# Patient Record
Sex: Male | Born: 1960 | Race: White | Hispanic: No | Marital: Single | Smoking: Never smoker
Health system: Southern US, Community
[De-identification: ages and names within clinical notes are randomized; demographics above are authoritative.]

## PROBLEM LIST (undated history)

## (undated) DIAGNOSIS — K5901 Slow transit constipation: Secondary | ICD-10-CM

## (undated) DIAGNOSIS — I10 Essential (primary) hypertension: Secondary | ICD-10-CM

## (undated) DIAGNOSIS — F329 Major depressive disorder, single episode, unspecified: Secondary | ICD-10-CM

## (undated) DIAGNOSIS — E0590 Thyrotoxicosis, unspecified without thyrotoxic crisis or storm: Secondary | ICD-10-CM

## (undated) DIAGNOSIS — F0390 Unspecified dementia without behavioral disturbance: Secondary | ICD-10-CM

## (undated) DIAGNOSIS — F32A Depression, unspecified: Secondary | ICD-10-CM

## (undated) DIAGNOSIS — G809 Cerebral palsy, unspecified: Secondary | ICD-10-CM

## (undated) DIAGNOSIS — E538 Deficiency of other specified B group vitamins: Secondary | ICD-10-CM

## (undated) HISTORY — PX: OTHER SURGICAL HISTORY: SHX169

## (undated) HISTORY — DX: Slow transit constipation: K59.01

---

## 2011-11-15 ENCOUNTER — Emergency Department: Payer: Self-pay | Admitting: Emergency Medicine

## 2014-07-21 DIAGNOSIS — Z8639 Personal history of other endocrine, nutritional and metabolic disease: Secondary | ICD-10-CM | POA: Insufficient documentation

## 2014-07-21 DIAGNOSIS — Z8679 Personal history of other diseases of the circulatory system: Secondary | ICD-10-CM | POA: Insufficient documentation

## 2014-07-21 DIAGNOSIS — G300 Alzheimer's disease with early onset: Secondary | ICD-10-CM

## 2014-07-21 DIAGNOSIS — F325 Major depressive disorder, single episode, in full remission: Secondary | ICD-10-CM | POA: Insufficient documentation

## 2014-07-21 DIAGNOSIS — F0280 Dementia in other diseases classified elsewhere without behavioral disturbance: Secondary | ICD-10-CM | POA: Insufficient documentation

## 2014-07-21 DIAGNOSIS — E538 Deficiency of other specified B group vitamins: Secondary | ICD-10-CM | POA: Insufficient documentation

## 2014-07-21 DIAGNOSIS — R32 Unspecified urinary incontinence: Secondary | ICD-10-CM | POA: Insufficient documentation

## 2014-07-21 DIAGNOSIS — G808 Other cerebral palsy: Secondary | ICD-10-CM | POA: Insufficient documentation

## 2014-07-24 DIAGNOSIS — F0280 Dementia in other diseases classified elsewhere without behavioral disturbance: Secondary | ICD-10-CM | POA: Insufficient documentation

## 2015-01-06 ENCOUNTER — Emergency Department: Payer: Medicare Other

## 2015-01-06 ENCOUNTER — Emergency Department
Admission: EM | Admit: 2015-01-06 | Discharge: 2015-01-06 | Disposition: A | Discharge status: Home / Self Care | Payer: Medicare Other | Attending: Emergency Medicine | Admitting: Emergency Medicine

## 2015-01-06 DIAGNOSIS — G809 Cerebral palsy, unspecified: Secondary | ICD-10-CM | POA: Diagnosis not present

## 2015-01-06 DIAGNOSIS — R262 Difficulty in walking, not elsewhere classified: Secondary | ICD-10-CM

## 2015-01-06 DIAGNOSIS — R531 Weakness: Secondary | ICD-10-CM

## 2015-01-06 LAB — CBC
HEMATOCRIT: 47.2 % (ref 40.0–52.0)
HEMOGLOBIN: 15.7 g/dL (ref 13.0–18.0)
MCH: 30.5 pg (ref 26.0–34.0)
MCHC: 33.3 g/dL (ref 32.0–36.0)
MCV: 91.5 fL (ref 80.0–100.0)
Platelets: 195 10*3/uL (ref 150–440)
RBC: 5.16 MIL/uL (ref 4.40–5.90)
RDW: 12.9 % (ref 11.5–14.5)
WBC: 7.5 10*3/uL (ref 3.8–10.6)

## 2015-01-06 LAB — URINALYSIS COMPLETE WITH MICROSCOPIC (ARMC ONLY)
Bilirubin Urine: NEGATIVE
GLUCOSE, UA: NEGATIVE mg/dL
HGB URINE DIPSTICK: NEGATIVE
Ketones, ur: NEGATIVE mg/dL
LEUKOCYTES UA: NEGATIVE
NITRITE: NEGATIVE
PH: 6 (ref 5.0–8.0)
PROTEIN: NEGATIVE mg/dL
SPECIFIC GRAVITY, URINE: 1.018 (ref 1.005–1.030)
SQUAMOUS EPITHELIAL / LPF: NONE SEEN

## 2015-01-06 LAB — COMPREHENSIVE METABOLIC PANEL
ALBUMIN: 3.9 g/dL (ref 3.5–5.0)
ALT: 8 U/L — AB (ref 17–63)
AST: 12 U/L — AB (ref 15–41)
Alkaline Phosphatase: 61 U/L (ref 38–126)
Anion gap: 5 (ref 5–15)
BILIRUBIN TOTAL: 0.4 mg/dL (ref 0.3–1.2)
BUN: 12 mg/dL (ref 6–20)
CHLORIDE: 109 mmol/L (ref 101–111)
CO2: 28 mmol/L (ref 22–32)
CREATININE: 0.58 mg/dL — AB (ref 0.61–1.24)
Calcium: 8.8 mg/dL — ABNORMAL LOW (ref 8.9–10.3)
GFR calc Af Amer: >60 mL/min (ref >60)
GLUCOSE: 102 mg/dL — AB (ref 65–99)
POTASSIUM: 4 mmol/L (ref 3.5–5.1)
Sodium: 142 mmol/L (ref 135–145)
TOTAL PROTEIN: 6.2 g/dL — AB (ref 6.5–8.1)

## 2015-01-06 LAB — DIFFERENTIAL
BASOS ABS: 0.1 10*3/uL (ref 0–0.1)
Basophils Relative: 1 %
EOS ABS: 0.3 10*3/uL (ref 0–0.7)
Eosinophils Relative: 4 %
LYMPHS ABS: 2.2 10*3/uL (ref 1.0–3.6)
Lymphocytes Relative: 30 %
MONOS PCT: 5 %
Monocytes Absolute: 0.4 10*3/uL (ref 0.2–1.0)
NEUTROS ABS: 4.5 10*3/uL (ref 1.4–6.5)
NEUTROS PCT: 60 %

## 2015-01-06 LAB — APTT: APTT: 30 s (ref 24–36)

## 2015-01-06 LAB — TROPONIN I

## 2015-01-06 LAB — PROTIME-INR
INR: 1.03
Prothrombin Time: 13.7 seconds (ref 11.4–15.0)

## 2015-01-06 LAB — GLUCOSE, CAPILLARY: Glucose-Capillary: 86 mg/dL (ref 65–99)

## 2015-01-06 NOTE — ED Notes (Signed)
Family at bedside. 

## 2015-01-06 NOTE — ED Notes (Signed)
Per pt caregiver, pt was normal at baseline when dropped off at the day center around 815 am, states when they picked the pt up around 3:15-3:20pm the pt was not able to walk and had drooling.. Pt has a hx of dementia and does not normally speak much..Marland Kitchen

## 2015-01-06 NOTE — ED Notes (Signed)
Pt back from CT

## 2015-01-06 NOTE — ED Notes (Signed)
Patient denies pain and is resting comfortably.  

## 2015-01-06 NOTE — Discharge Instructions (Signed)
Mr. obese tests, including head CT and urinalysis and blood tests, overall looks quite good. There are no acute changes. His movement of all 4 extremities seemed reasonable with his baseline. He followed commands and answered yes no appropriately. He reported that this was his usual manner communication.  Please follow-up with his regular doctor for any further concerns. Return to emergency department if there are further urgent concerns.   Cerebral Palsy Cerebral palsy is a disorder that affects a person's ability to control movement and posture. This condition results from damage to the areas of the brain that control the use of muscles. It appears in the first few years of life. The condition may affect the whole body or just certain parts. Cerebral palsy cannot be cured. However, the condition will not progress or get worse, and symptoms can be improved with treatment. The effects of cerebral palsy can range from mild to severe. Many people with cerebral palsy can live near-normal lives if their problems are properly managed. In other cases, the condition can cause severe disabilities. There are three main types of cerebral palsy:  Spastic. This is the most common type. The muscles are in a constant state of tightness. This leads to stiff or jerky movements. Arms, legs, and other areas of the body may be affected to varying degrees.  Athetoid or dyskinetic. The muscle tone varies, sometimes being too tight and sometimes too loose. This leads to difficulty controlling and coordinating movement. Involuntary movements and constant motion are common.  Ataxic. This type leads to problems with balance and coordination. The person may have a staggering walk, unsteady hands, and abnormal eye movements. Some people may have a combination of these types. CAUSES Cerebral palsy is caused by injury to--or abnormal development of--the parts of the brain that control the use of muscles. In most cases, the damage  to the brain occurs before birth (congenital). It can also occur during or after birth. Some common causes of this damage include:  Head injury.  Meningitis or encephalitis.  Infections that were passed from the mother.  Genetic disorders.  Stroke before birth.  Lack of oxygen to the brain during birth. This is rare.  Severe jaundice after birth. RISK FACTORS Some things that can increase a child's risk for cerebral palsy include:  Premature birth.  Low birth weight.  Infections in the mother during pregnancy. SIGNS AND SYMPTOMS Signs and symptoms vary from person to person and can change over time. They can range from mild to severe. Early signs of cerebral palsy usually appear before 60 years of age. Children with cerebral palsy are often slow to reach developmental milestones, such as:  Rolling over.  Sitting.  Crawling.  Walking.  Talking. Later symptoms may include:  Stiff or jerky movements of the legs, arms, and back.  Abnormal movements that can be stiff or floppy.  Slow movements.  Trouble with muscle coordination and balance.  Difficulty walking.  Difficulty sitting straight.  Difficulty with fine motor tasks, such as writing or using scissors.  Exaggerated or reduced reflexes.  Poor control of the mouth, which an lead to drooling, problems with chewing and swallowing, or problems with speech. Some people with cerebral palsy are also affected by other medical problems, such as:  Seizures.  Mental impairment.  Problems with vision, hearing, or speech.  Dental problems and oral diseases. DIAGNOSIS To make a diagnosis, your child's health care provider may:  Do a physical exam and take your child's medical history.  Test  your child's muscle tone, motor skills, and reflexes.  Order imaging tests of your child's brain, such as an ultrasound, CT scan, or MRI.  Order blood tests to check for other conditions. TREATMENT There is no cure for  cerebral palsy. Treatment will be aimed at helping to control your child's symptoms and helping to improve his or her ability to function. Your child may be referred to an early intervention program to help coordinate treatment. Treatment may include:  Medicines to control seizures and muscle spasms.  Braces or splints to help with muscle imbalance.  Surgery to treat muscle stiffness or to relax muscle tendons that are too tight.  Mechanical aids to help perform tasks that are difficult because of impairments.  Physical, occupational, and speech therapy.  Counseling for emotional and psychological needs. HOME CARE INSTRUCTIONS  Give medicines only as directed by your child's health care provider.   Work closely with your child's team of health care providers. This team may include:  A physical therapist to help your child build stronger muscles and improve balance and coordination.  A speech and language therapist to help your child with speech, language, and feeding.  An occupational therapist to help your child with skills that are needed for tasks such as eating, dressing, and writing.  A counselor or a behavioral therapist to help your child with emotional or behavioral issues.  A neurologist or a developmental pediatrician to help coordinate care, treat medical conditions, and manage medicines.  Keep all follow-up visits as directed by your child's health care provider. This is important. SEEK MEDICAL CARE IF:  Your child has a fever.  Your child has chills.  Your child's symptoms are getting worse.  Your child develops new symptoms.  Your child has a cough that will not go away.  Your child is having trouble getting enough nutrition.  Your child has shortness of breath.  Your child is anxious or depressed. SEEK IMMEDIATE MEDICAL CARE IF:  Your child chokes, coughs, or has trouble breathing after eating or drinking.  Your child gets hurt in a fall.  Your  child has new seizures or a significant change in his or her usual seizure pattern.   This information is not intended to replace advice given to you by your health care provider. Make sure you discuss any questions you have with your health care provider.   Document Released: 10/09/2001 Document Revised: 02/07/2014 Document Reviewed: 08/28/2013 Elsevier Interactive Patient Education [employer]2016 [employer] Inc.

## 2015-01-06 NOTE — ED Provider Notes (Addendum)
[facility] Emergency Department Provider Note  ____________________________________________  Time seen: [id number]   I have reviewed the triage vital signs and the nursing notes.  History by:  2 caretakers from [facility]  HISTORY  Chief Complaint Weakness     HPI Maurice Clayton is a 54 y.o. male with cerebral palsy. He requires a fair amount of support and care. He has a diagnosis of dementia as well. He goes to a day care program. Today when staff from [facility] take the patient not, they report that he had decreased ability to ambulate. Usually he can ambulate with staff assisting him. Sometimes she uses a walker. Today, he had decreased strength in his legs. He also had a degree of drooling.  Now, in the emergency department, the patient is alert and acting as he usually does according to the care home staff. There are tooth care home staff members here. They report no acute changes at this present moment except for the decreased ability to ambulate.  The patient is alert. He has limited communication baseline, but is communicating to his usual ability, according to the care home staff. He denies any pain, difficulty breathing. He follows commands and is able to move all 4 extremities and has usual manner.    Past medical history: Cerebral palsy Dementia Urinary incontinence UTIs  There are no active problems to display for this patient.   No past surgical history on file.  No current outpatient prescriptions on file.  Allergies Review of patient's allergies indicates not on file.  No family history on file.  Social History Social History  Substance Use Topics  . Smoking status: Never Smoker   . Smokeless tobacco: None  . Alcohol Use: No    Review of Systems Review of systems a possible due to the patient's cerebral palsy, limited cognition communication.    ____________________________________________   PHYSICAL  EXAM:  VITAL SIGNS: ED Triage Vitals  Enc Vitals Group     BP 01/06/15 1830 107/72 mmHg     Pulse Rate 01/06/15 1830 70     Resp 01/06/15 1830 16     Temp 01/06/15 1830 98 F (36.7 C)     Temp Source 01/06/15 1830 Oral     SpO2 01/06/15 1830 96 %     Weight 01/06/15 1830 113 lb (51.256 kg)     Height 01/06/15 1830 5\' 7"  (1.702 m)     Head Cir --      Peak Flow --      Pain Score --      Pain Loc --      Pain Edu? --      Excl. in GC? --     Constitutional:  Alert. Noted assistance needed for transfer from wheelchair to bed. Patient has limited communication, but is acting in his usual manner according to care home staff. Patient does respond to my questions and follows commands. ENT   Head:  atraumatic.   Nose: No congestion/rhinnorhea.       Mouth: No erythema, no swelling  patient does have upper and lower dentures. The uppers are a little bit loose and he seems to be playing with them and moving them around his mouth a little bit. He appears to have good control.  Cardiovascular: Normal rate, regular rhythm, no murmur noted Respiratory:  Normal respiratory effort, no tachypnea.    Breath sounds are clear and equal bilaterally.  Gastrointestinal: Soft, no distention. Nontender Back: No  muscle spasm, no tenderness, no CVA tenderness. Musculoskeletal: No deformity noted. Nontender with normal range of motion in all extremities.  No noted edema. Neurologic:  Patient does not communicate verbally. He is able to nod yes and no appropriately to my questions. He is following commands and moving all 4 extremities. He has 4-5 over 5 strength in all 4 extremities. Skin:  Skin is warm, dry. No rash noted. Psychiatric: Limited communication. Cooperative, calm. Acting as he usually does according to the care home staff.  ____________________________________________    LABS (pertinent positives/negatives)  Labs Reviewed  COMPREHENSIVE METABOLIC PANEL - Abnormal; Notable for the  following:    Glucose, Bld 102 (*)    Creatinine, Ser 0.58 (*)    Calcium 8.8 (*)    Total Protein 6.2 (*)    AST 12 (*)    ALT 8 (*)    All other components within normal limits  URINALYSIS COMPLETEWITH MICROSCOPIC ([facility] ONLY) - Abnormal; Notable for the following:    Color, Urine YELLOW (*)    APPearance CLEAR (*)    Bacteria, UA RARE (*)    All other components within normal limits  PROTIME-INR  APTT  CBC  DIFFERENTIAL  TROPONIN I  GLUCOSE, CAPILLARY     ____________________________________________   EKG  ED ECG REPORT I, Catrinia Racicot W, the attending physician, personally viewed and interpreted this ECG.   Date: 01/06/2015  EKG Time: 1908  Rate: 64  Rhythm:  Normal sinus rhythm  Axis: Normal  Intervals: Normal  ST&T Change: None noted   ____________________________________________    RADIOLOGY  CT head IMPRESSION: 1. No acute intracranial pathology seen on CT. 2. Mild cortical volume loss noted. 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  These results were called by telephone at the time of interpretation on 01/06/2015 at 6:58 pm to Dr. Sharma Covert, who verbally acknowledged these results.  ____________________________________________   INITIAL IMPRESSION / ASSESSMENT AND PLAN / ED COURSE  Pertinent labs & imaging results that were available during my care of the patient were reviewed by me and considered in my medical decision making (see chart for details).  Slightly limited exam due to the patient's sliding cognition and communication, but this 54 year old male appears stable and well moment, acting and communicating as he usually does.  He requires assistance with ambulation baseline, however, he is requiring additional assistance this evening. While he was reported to be drooling earlier, he is not drooling at this time and appears to be functioning at his baseline.  CT head does not show any acute changes.  He does have a history of urinary tract  infections. We will check a urine along with the stroke protocol orders that were ordered by nursing.  ----------------------------------------- 10:05 PM on 01/06/2015 -----------------------------------------  Urine test is negative. Patient looks stable and care home staff report he is at his baseline. We will discharge him back to [facility] for his ongoing care.  ____________________________________________   FINAL CLINICAL IMPRESSION(S) / ED DIAGNOSES  Final diagnoses:  General weakness  Ambulatory dysfunction  Cerebral palsy, unspecified type (HCC)      Darien Ramus, MD 01/06/15 2205  Darien Ramus, MD 01/06/15 2206

## 2015-01-06 NOTE — ED Notes (Signed)
Pt taken to CT.

## 2015-01-13 ENCOUNTER — Emergency Department
Admission: EM | Admit: 2015-01-13 | Discharge: 2015-01-13 | Disposition: A | Discharge status: Home / Self Care | Payer: Medicare Other | Attending: Emergency Medicine | Admitting: Emergency Medicine

## 2015-01-13 ENCOUNTER — Encounter: Payer: Self-pay | Admitting: Emergency Medicine

## 2015-01-13 ENCOUNTER — Emergency Department: Payer: Medicare Other

## 2015-01-13 DIAGNOSIS — Y9389 Activity, other specified: Secondary | ICD-10-CM | POA: Insufficient documentation

## 2015-01-13 DIAGNOSIS — Y998 Other external cause status: Secondary | ICD-10-CM | POA: Diagnosis not present

## 2015-01-13 DIAGNOSIS — Y92129 Unspecified place in nursing home as the place of occurrence of the external cause: Secondary | ICD-10-CM | POA: Diagnosis not present

## 2015-01-13 DIAGNOSIS — S92501A Displaced unspecified fracture of right lesser toe(s), initial encounter for closed fracture: Secondary | ICD-10-CM

## 2015-01-13 DIAGNOSIS — S92531A Displaced fracture of distal phalanx of right lesser toe(s), initial encounter for closed fracture: Secondary | ICD-10-CM | POA: Diagnosis not present

## 2015-01-13 DIAGNOSIS — S90121A Contusion of right lesser toe(s) without damage to nail, initial encounter: Secondary | ICD-10-CM | POA: Insufficient documentation

## 2015-01-13 DIAGNOSIS — S99921A Unspecified injury of right foot, initial encounter: Secondary | ICD-10-CM | POA: Diagnosis present

## 2015-01-13 DIAGNOSIS — W228XXA Striking against or struck by other objects, initial encounter: Secondary | ICD-10-CM | POA: Insufficient documentation

## 2015-01-13 DIAGNOSIS — G809 Cerebral palsy, unspecified: Secondary | ICD-10-CM | POA: Insufficient documentation

## 2015-01-13 DIAGNOSIS — F0390 Unspecified dementia without behavioral disturbance: Secondary | ICD-10-CM | POA: Insufficient documentation

## 2015-01-13 HISTORY — DX: Cerebral palsy, unspecified: G80.9

## 2015-01-13 NOTE — ED Notes (Signed)
Pt stubbed fifth toe on right foot, bruising and bleeding per caregiver.

## 2015-01-13 NOTE — Discharge Instructions (Signed)
Buddy tape toe for 2-3 weeks pending orthopedic reevaluation.

## 2015-01-13 NOTE — ED Notes (Signed)
Right 5 th toe bruised and swollen   Per staff he hit his toe this am   No swelling noted to foot

## 2015-01-13 NOTE — ED Provider Notes (Signed)
[facility] Emergency Department Provider Note  ____________________________________________  Time seen: Approximately 10:59 AM  I have reviewed the triage vital signs and the nursing notes.   HISTORY  Chief Complaint Toe Pain  History given by care provider  HPI QUINLIN CONANT is a 54 y.o. male cerebral palsy patient from the [facility] nursing home. Patient has edema and ecchymosis to the fifth digit right toe staff states today he stubbed his toe. Patient also has a diagnosis of dementia which is limiting history of present illness. Per nursing home staff he is communicating his baseline abilities. Patient is alert and follow commands when asked to move his right foot. Patient cannot rate on describe his pain.  Past Medical History  Diagnosis Date  . Cerebral palsy (HCC)     There are no active problems to display for this patient.   History reviewed. No pertinent past surgical history.  No current outpatient prescriptions on file.  Allergies Review of patient's allergies indicates no known allergies.  No family history on file.  Social History Social History  Substance Use Topics  . Smoking status: Never Smoker   . Smokeless tobacco: None  . Alcohol Use: No    Review of Systems Constitutional: No fever/chills Eyes: No visual changes. ENT: No sore throat. Cardiovascular: Denies chest pain. Respiratory: Denies shortness of breath. Gastrointestinal: No abdominal pain.  No nausea, no vomiting.  No diarrhea.  No constipation. Genitourinary: Negative for dysuria. Musculoskeletal: Negative for back pain. Skin: Negative for rash. Neurological: Cerebral palsy Psychiatric:Dementia 10-point ROS otherwise negative.  ____________________________________________   PHYSICAL EXAM:  VITAL SIGNS: ED Triage Vitals  Enc Vitals Group     BP 01/13/15 1020 148/80 mmHg     Pulse Rate 01/13/15 1020 105     Resp 01/13/15 1020 18     Temp 01/13/15  1020 97.6 F (36.4 C)     Temp Source 01/13/15 1020 Oral     SpO2 01/13/15 1020 99 %     Weight 01/13/15 1020 107 lb (48.535 kg)     Height 01/13/15 1020  (1.651 m)     Head Cir --      Peak Flow --      Pain Score --      Pain Loc --      Pain Edu? --      Excl. in GC? --     Constitutional: Alert and oriented. Well appearing and in no acute distress. Eyes: Conjunctivae are normal. PERRL. EOMI. Head: Atraumatic. Nose: No congestion/rhinnorhea. Mouth/Throat: Mucous membranes are moist.  Oropharynx non-erythematous. Neck: No stridor.  No cervical spine tenderness to palpation. Hematological/Lymphatic/Immunilogical: No cervical lymphadenopathy. Cardiovascular: Normal rate, regular rhythm. Grossly normal heart sounds.  Good peripheral circulation. Respiratory: Normal respiratory effort.  No retractions. Lungs CTAB. Gastrointestinal: Soft and nontender. No distention. No abdominal bruits. No CVA tenderness. Musculoskeletal: Guarding palpation of the fifth digit right foot. Neurologic:  Normal speech and language. No gross focal neurologic deficits are appreciated. No gait instability. Skin:  Skin is warm, dry and intact. No rash noted. Psychiatric: Mood and affect are normal. Speech and behavior are normal.  ____________________________________________   LABS (all labs ordered are listed, but only abnormal results are displayed)  Labs Reviewed - No data to display ____________________________________________  EKG   ____________________________________________  RADIOLOGY acute mild displaced fracture involving the distal phalanx fifth digit right foot. ____________________________________________   PROCEDURES  Procedure(s) performed: None  Critical Care performed: No  ____________________________________________  INITIAL IMPRESSION / ASSESSMENT AND PLAN / ED COURSE  Pertinent labs & imaging results that were available during my care of the patient were  reviewed by me and considered in my medical decision making (see chart for details).  Fractures fifth digit right foot. Discuss x-ray results with nursing home staff. Toe was cleaning buddy tape patient care provider given instructional care. Advised to follow orthopedics in one week. ____________________________________________   FINAL CLINICAL IMPRESSION(S) / ED DIAGNOSES  Final diagnoses:  Fracture of fifth toe, right, closed, initial encounter      Joni ReiningRonald K Antwione Picotte, PA-C 01/13/15 1214  Minna AntisKevin Paduchowski, MD 01/13/15 1558

## 2015-02-06 ENCOUNTER — Emergency Department: Payer: Medicare Other

## 2015-02-06 ENCOUNTER — Inpatient Hospital Stay
Admission: EM | Admit: 2015-02-06 | Discharge: 2015-02-13 | DRG: 469 | Disposition: A | Discharge status: Skilled Nursing Facility | Payer: Medicare Other | Attending: Internal Medicine | Admitting: Internal Medicine

## 2015-02-06 ENCOUNTER — Encounter: Payer: Self-pay | Admitting: Emergency Medicine

## 2015-02-06 DIAGNOSIS — Z905 Acquired absence of kidney: Secondary | ICD-10-CM

## 2015-02-06 DIAGNOSIS — R509 Fever, unspecified: Secondary | ICD-10-CM

## 2015-02-06 DIAGNOSIS — E0590 Thyrotoxicosis, unspecified without thyrotoxic crisis or storm: Secondary | ICD-10-CM | POA: Diagnosis present

## 2015-02-06 DIAGNOSIS — G808 Other cerebral palsy: Secondary | ICD-10-CM | POA: Diagnosis present

## 2015-02-06 DIAGNOSIS — Z96649 Presence of unspecified artificial hip joint: Secondary | ICD-10-CM

## 2015-02-06 DIAGNOSIS — N2889 Other specified disorders of kidney and ureter: Secondary | ICD-10-CM

## 2015-02-06 DIAGNOSIS — R109 Unspecified abdominal pain: Secondary | ICD-10-CM

## 2015-02-06 DIAGNOSIS — K5641 Fecal impaction: Secondary | ICD-10-CM

## 2015-02-06 DIAGNOSIS — Z419 Encounter for procedure for purposes other than remedying health state, unspecified: Secondary | ICD-10-CM

## 2015-02-06 DIAGNOSIS — G300 Alzheimer's disease with early onset: Secondary | ICD-10-CM | POA: Diagnosis present

## 2015-02-06 DIAGNOSIS — E538 Deficiency of other specified B group vitamins: Secondary | ICD-10-CM | POA: Diagnosis present

## 2015-02-06 DIAGNOSIS — S72001A Fracture of unspecified part of neck of right femur, initial encounter for closed fracture: Secondary | ICD-10-CM | POA: Diagnosis present

## 2015-02-06 DIAGNOSIS — W19XXXA Unspecified fall, initial encounter: Secondary | ICD-10-CM | POA: Diagnosis present

## 2015-02-06 DIAGNOSIS — Z7401 Bed confinement status: Secondary | ICD-10-CM

## 2015-02-06 DIAGNOSIS — S7291XA Unspecified fracture of right femur, initial encounter for closed fracture: Secondary | ICD-10-CM | POA: Diagnosis present

## 2015-02-06 DIAGNOSIS — F329 Major depressive disorder, single episode, unspecified: Secondary | ICD-10-CM | POA: Diagnosis present

## 2015-02-06 DIAGNOSIS — Z7982 Long term (current) use of aspirin: Secondary | ICD-10-CM

## 2015-02-06 DIAGNOSIS — I10 Essential (primary) hypertension: Secondary | ICD-10-CM | POA: Diagnosis present

## 2015-02-06 DIAGNOSIS — F0280 Dementia in other diseases classified elsewhere without behavioral disturbance: Secondary | ICD-10-CM | POA: Diagnosis present

## 2015-02-06 DIAGNOSIS — M1990 Unspecified osteoarthritis, unspecified site: Secondary | ICD-10-CM | POA: Diagnosis present

## 2015-02-06 DIAGNOSIS — R1084 Generalized abdominal pain: Secondary | ICD-10-CM | POA: Diagnosis not present

## 2015-02-06 DIAGNOSIS — K5649 Other impaction of intestine: Secondary | ICD-10-CM | POA: Diagnosis present

## 2015-02-06 DIAGNOSIS — J189 Pneumonia, unspecified organism: Secondary | ICD-10-CM | POA: Diagnosis not present

## 2015-02-06 DIAGNOSIS — Y838 Other surgical procedures as the cause of abnormal reaction of the patient, or of later complication, without mention of misadventure at the time of the procedure: Secondary | ICD-10-CM | POA: Diagnosis not present

## 2015-02-06 DIAGNOSIS — K5900 Constipation, unspecified: Secondary | ICD-10-CM

## 2015-02-06 DIAGNOSIS — Z79899 Other long term (current) drug therapy: Secondary | ICD-10-CM | POA: Diagnosis not present

## 2015-02-06 DIAGNOSIS — Z681 Body mass index (BMI) 19 or less, adult: Secondary | ICD-10-CM

## 2015-02-06 DIAGNOSIS — J9589 Other postprocedural complications and disorders of respiratory system, not elsewhere classified: Secondary | ICD-10-CM | POA: Diagnosis not present

## 2015-02-06 DIAGNOSIS — L8990 Pressure ulcer of unspecified site, unspecified stage: Secondary | ICD-10-CM | POA: Insufficient documentation

## 2015-02-06 DIAGNOSIS — R52 Pain, unspecified: Secondary | ICD-10-CM

## 2015-02-06 HISTORY — DX: Deficiency of other specified B group vitamins: E53.8

## 2015-02-06 HISTORY — DX: Thyrotoxicosis, unspecified without thyrotoxic crisis or storm: E05.90

## 2015-02-06 HISTORY — DX: Essential (primary) hypertension: I10

## 2015-02-06 HISTORY — DX: Depression, unspecified: F32.A

## 2015-02-06 HISTORY — DX: Major depressive disorder, single episode, unspecified: F32.9

## 2015-02-06 HISTORY — DX: Unspecified dementia, unspecified severity, without behavioral disturbance, psychotic disturbance, mood disturbance, and anxiety: F03.90

## 2015-02-06 LAB — URINALYSIS COMPLETE WITH MICROSCOPIC (ARMC ONLY)
Bacteria, UA: NONE SEEN
Bilirubin Urine: NEGATIVE
Glucose, UA: NEGATIVE mg/dL
Hgb urine dipstick: NEGATIVE
KETONES UR: NEGATIVE mg/dL
Leukocytes, UA: NEGATIVE
NITRITE: NEGATIVE
PH: 6 (ref 5.0–8.0)
PROTEIN: NEGATIVE mg/dL
SPECIFIC GRAVITY, URINE: 1.011 (ref 1.005–1.030)
Squamous Epithelial / LPF: NONE SEEN

## 2015-02-06 LAB — COMPREHENSIVE METABOLIC PANEL
ALBUMIN: 3.5 g/dL (ref 3.5–5.0)
ALK PHOS: 101 U/L (ref 38–126)
ALT: 12 U/L — ABNORMAL LOW (ref 17–63)
ANION GAP: 7 (ref 5–15)
AST: 11 U/L — ABNORMAL LOW (ref 15–41)
BILIRUBIN TOTAL: 0.4 mg/dL (ref 0.3–1.2)
BUN: 9 mg/dL (ref 6–20)
CALCIUM: 8.8 mg/dL — AB (ref 8.9–10.3)
CO2: 29 mmol/L (ref 22–32)
CREATININE: 0.55 mg/dL — AB (ref 0.61–1.24)
Chloride: 103 mmol/L (ref 101–111)
GFR calc Af Amer: >60 mL/min (ref >60)
GFR calc non Af Amer: >60 mL/min (ref >60)
GLUCOSE: 96 mg/dL (ref 65–99)
Potassium: 3.9 mmol/L (ref 3.5–5.1)
Sodium: 139 mmol/L (ref 135–145)
TOTAL PROTEIN: 6.2 g/dL — AB (ref 6.5–8.1)

## 2015-02-06 LAB — CBC WITH DIFFERENTIAL/PLATELET
BASOS PCT: 1 %
Basophils Absolute: 0 10*3/uL (ref 0–0.1)
Eosinophils Absolute: 0.1 10*3/uL (ref 0–0.7)
Eosinophils Relative: 2 %
HEMATOCRIT: 46.1 % (ref 40.0–52.0)
HEMOGLOBIN: 15.2 g/dL (ref 13.0–18.0)
LYMPHS ABS: 1.4 10*3/uL (ref 1.0–3.6)
Lymphocytes Relative: 23 %
MCH: 30 pg (ref 26.0–34.0)
MCHC: 33.1 g/dL (ref 32.0–36.0)
MCV: 90.7 fL (ref 80.0–100.0)
MONOS PCT: 7 %
Monocytes Absolute: 0.4 10*3/uL (ref 0.2–1.0)
NEUTROS ABS: 4.3 10*3/uL (ref 1.4–6.5)
NEUTROS PCT: 67 %
Platelets: 281 10*3/uL (ref 150–440)
RBC: 5.08 MIL/uL (ref 4.40–5.90)
RDW: 13.1 % (ref 11.5–14.5)
WBC: 6.3 10*3/uL (ref 3.8–10.6)

## 2015-02-06 LAB — TROPONIN I: Troponin I: <0.03 ng/mL (ref <0.031)

## 2015-02-06 LAB — SEDIMENTATION RATE: Sed Rate: 2 mm/hr (ref 0–20)

## 2015-02-06 MED ORDER — ACETAMINOPHEN 325 MG PO TABS
650.0000 mg | ORAL_TABLET | Freq: Four times a day (QID) | ORAL | Status: DC | PRN
  Administered 2015-02-07 – 2015-02-11 (×2): 650 mg via ORAL
  Filled 2015-02-06 (×2): qty 2

## 2015-02-06 MED ORDER — ONDANSETRON HCL 4 MG PO TABS
4.0000 mg | ORAL_TABLET | Freq: Four times a day (QID) | ORAL | Status: DC | PRN
Start: 2015-02-06 — End: 2015-02-13

## 2015-02-06 MED ORDER — FLEET ENEMA 7-19 GM/118ML RE ENEM
1.0000 | ENEMA | Freq: Once | RECTAL | Status: AC | PRN
  Administered 2015-02-08: 1 via RECTAL

## 2015-02-06 MED ORDER — POLYETHYLENE GLYCOL 3350 17 GM/SCOOP PO POWD
1.0000 | Freq: Once | ORAL | Status: DC
  Filled 2015-02-06: qty 255

## 2015-02-06 MED ORDER — ONDANSETRON HCL 4 MG/2ML IJ SOLN: 4.0000 mg | Freq: Four times a day (QID) | INTRAMUSCULAR | Status: DC | PRN

## 2015-02-06 MED ORDER — DESMOPRESSIN ACETATE 0.2 MG PO TABS
0.2500 mg | ORAL_TABLET | Freq: Every day | ORAL | Status: DC
  Filled 2015-02-06: qty 1

## 2015-02-06 MED ORDER — HYDROCORTISONE 2.5 % RE CREA
1.0000 "application " | TOPICAL_CREAM | Freq: Three times a day (TID) | RECTAL | Status: DC
  Administered 2015-02-06 – 2015-02-13 (×16): 1 via RECTAL
  Filled 2015-02-06: qty 28.35

## 2015-02-06 MED ORDER — SODIUM CHLORIDE 0.9 % IV SOLN
INTRAVENOUS | Status: AC
  Administered 2015-02-06: 19:00:00 via INTRAVENOUS

## 2015-02-06 MED ORDER — DOCUSATE SODIUM 100 MG PO CAPS
100.0000 mg | ORAL_CAPSULE | Freq: Two times a day (BID) | ORAL | Status: DC
  Administered 2015-02-06 – 2015-02-12 (×11): 100 mg via ORAL
  Filled 2015-02-06 (×12): qty 1

## 2015-02-06 MED ORDER — ACETAMINOPHEN 650 MG RE SUPP: 650.0000 mg | Freq: Four times a day (QID) | RECTAL | Status: DC | PRN

## 2015-02-06 MED ORDER — RIVASTIGMINE 4.6 MG/24HR TD PT24
4.6000 mg | MEDICATED_PATCH | Freq: Every day | TRANSDERMAL | Status: DC
  Administered 2015-02-06 – 2015-02-13 (×7): 4.6 mg via TRANSDERMAL
  Filled 2015-02-06 (×8): qty 1

## 2015-02-06 MED ORDER — MIRTAZAPINE 15 MG PO TABS
15.0000 mg | ORAL_TABLET | Freq: Every evening | ORAL | Status: DC
  Administered 2015-02-06 – 2015-02-12 (×6): 15 mg via ORAL
  Filled 2015-02-06 (×6): qty 1

## 2015-02-06 MED ORDER — MEMANTINE HCL ER 7 MG PO CP24
28.0000 mg | ORAL_CAPSULE | Freq: Every evening | ORAL | Status: DC
  Administered 2015-02-06 – 2015-02-12 (×6): 28 mg via ORAL
  Filled 2015-02-06: qty 4
  Filled 2015-02-06: qty 1
  Filled 2015-02-06 (×3): qty 4
  Filled 2015-02-06 (×2): qty 1
  Filled 2015-02-06: qty 4

## 2015-02-06 MED ORDER — ENOXAPARIN SODIUM 40 MG/0.4ML ~~LOC~~ SOLN
40.0000 mg | SUBCUTANEOUS | Status: DC
  Administered 2015-02-06 – 2015-02-12 (×6): 40 mg via SUBCUTANEOUS
  Filled 2015-02-06 (×6): qty 0.4

## 2015-02-06 MED ORDER — RISPERIDONE 0.5 MG PO TABS
1.0000 mg | ORAL_TABLET | Freq: Three times a day (TID) | ORAL | Status: DC
  Administered 2015-02-06 – 2015-02-13 (×18): 1 mg via ORAL
  Filled 2015-02-06 (×18): qty 2

## 2015-02-06 MED ORDER — ASPIRIN EC 81 MG PO TBEC
81.0000 mg | DELAYED_RELEASE_TABLET | Freq: Every evening | ORAL | Status: DC
  Administered 2015-02-06 – 2015-02-12 (×6): 81 mg via ORAL
  Filled 2015-02-06 (×6): qty 1

## 2015-02-06 MED ORDER — BISACODYL 10 MG RE SUPP: 10.0000 mg | Freq: Every day | RECTAL | Status: DC | PRN

## 2015-02-06 MED ORDER — MAGNESIUM HYDROXIDE 400 MG/5ML PO SUSP: 30.0000 mL | Freq: Every day | ORAL | Status: DC | PRN

## 2015-02-06 MED ORDER — ESCITALOPRAM OXALATE 10 MG PO TABS
20.0000 mg | ORAL_TABLET | Freq: Every day | ORAL | Status: DC
  Administered 2015-02-06 – 2015-02-13 (×7): 20 mg via ORAL
  Filled 2015-02-06 (×7): qty 2

## 2015-02-06 MED ORDER — IOHEXOL 300 MG/ML  SOLN
100.0000 mL | Freq: Once | INTRAMUSCULAR | Status: AC | PRN
  Administered 2015-02-06: 100 mL via INTRAVENOUS

## 2015-02-06 MED ORDER — DESMOPRESSIN ACETATE 0.2 MG PO TABS
0.2000 mg | ORAL_TABLET | Freq: Every day | ORAL | Status: DC
  Administered 2015-02-06 – 2015-02-12 (×7): 0.2 mg via ORAL
  Filled 2015-02-06 (×8): qty 1

## 2015-02-06 NOTE — ED Notes (Signed)
Brought in by staff at care home. Pt is alert and non-verbal per his norm.  States pt has been holding his right leg in a "funny position" for several weeks and seems more lethargic than usual and not wanting to eat.

## 2015-02-06 NOTE — ED Notes (Signed)
MD at bedside. 

## 2015-02-06 NOTE — ED Provider Notes (Addendum)
[facility] Emergency Department Provider Note  ____________________________________________  Time seen: Approximately 4:34 PM  I have reviewed the triage vital signs and the nursing notes.   HISTORY  Chief Complaint Weakness  History limited by patient being nonverbal  HPI Maurice Clayton is a 55 y.o. male patient reported by caregivers to have been walking around and acting more normal about 3 months ago. But then he stopped walking and has had a rapid decrease in function. Especially in the last few weeks. He is not eating or drinking as much as normal and seems to be weaker and less active. He is also been reported be holding his right leg strangely and supporting it with his arms last few weeks.   Past Medical History  Diagnosis Date  . Cerebral palsy (HCC)     There are no active problems to display for this patient.   History reviewed. No pertinent past surgical history.  Current Outpatient Rx  Name  Route  Sig  Dispense  Refill  . memantine (NAMENDA) 5 MG tablet   Oral   Take 5 mg by mouth 2 (two) times daily.         . mirtazapine (REMERON) 15 MG tablet   Oral   Take 15 mg by mouth at bedtime.         . risperiDONE (RISPERDAL) 0.5 MG tablet   Oral   Take 0.5 mg by mouth 3 (three) times daily.            Allergies Review of patient's allergies indicates no known allergies.  History reviewed. No pertinent family history.  Social History Social History  Substance Use Topics  . Smoking status: Never Smoker   . Smokeless tobacco: None  . Alcohol Use: No    Review of Systems Unable to obtain  ____________________________________________   PHYSICAL EXAM:  VITAL SIGNS: ED Triage Vitals  Enc Vitals Group     BP 02/06/15 1155 109/67 mmHg     Pulse Rate 02/06/15 1155 105     Resp 02/06/15 1155 18     Temp 02/06/15 1155 98.5 F (36.9 C)     Temp Source 02/06/15 1155 Oral     SpO2 02/06/15 1155 98 %     Weight 02/06/15  1155 107 lb (48.535 kg)     Height 02/06/15 1155 5\' 7"  (1.702 m)     Head Cir --      Peak Flow --      Pain Score 02/06/15 1156 0     Pain Loc --      Pain Edu? --      Excl. in GC? --     Constitutional:  in no acute distress. Patient will look at me and occasionally nod or shake his head in response to my questions Eyes: Conjunctivae are normal. PERRL. EOMI. Head: Atraumatic. Nose: No congestion/rhinnorhea. Mouth/Throat: Mucous membranes are moist.  Oropharynx non-erythematous. Neck: No stridor.  Cardiovascular: Normal rate, regular rhythm. Grossly normal heart sounds.  Good peripheral circulation. Respiratory: Normal respiratory effort.  No retractions. Lungs CTAB. Gastrointestinal: Soft and nontender. No distention. No abdominal bruits. No CVA tenderness. Musculoskeletal: No lower extremity tenderness nor edema.  No joint effusions. Patient maintains his legs drawn up flexed Neurologic: Reportedly at baseline per caregivers at least baseline the last few weeks Skin:  Skin is warm, dry and intact. No rash noted.   ____________________________________________   LABS (all labs ordered are listed, but only abnormal results are displayed)  Labs Reviewed  COMPREHENSIVE METABOLIC PANEL - Abnormal; Notable for the following:    Creatinine, Ser 0.55 (*)    Calcium 8.8 (*)    Total Protein 6.2 (*)    AST 11 (*)    ALT 12 (*)    All other components within normal limits  URINALYSIS COMPLETEWITH MICROSCOPIC ([facility] ONLY) - Abnormal; Notable for the following:    Color, Urine YELLOW (*)    APPearance CLEAR (*)    All other components within normal limits  CBC WITH DIFFERENTIAL/PLATELET  TROPONIN I  SEDIMENTATION RATE   ____________________________________________  EKG  ____________________________________________  RADIOLOGY  CT read by radiology shows a massive fecal impaction and a mass on the kidney and a hip fracture appears  old. ____________________________________________   PROCEDURES    ____________________________________________   INITIAL IMPRESSION / ASSESSMENT AND PLAN / ED COURSE  Pertinent labs & imaging results that were available during my care of the patient were reviewed by me and considered in my medical decision making (see chart for details).  I discussed patient with Dr. Trilby DrummerManns who reviewed the CT scan he feels this fracture may be about 413 months old. He reports that he will need some further evaluation before he is able to answer surgery on this patient.  ____________________________________________   FINAL CLINICAL IMPRESSION(S) / ED DIAGNOSES  Final diagnoses:  Hip fracture, right, closed, initial encounter [facility](HCC)  Fecal impaction of colon [facility](HCC)  Renal mass      Arnaldo NatalPaul F Micaella Gitto, MD 02/06/15 1638  EKG read and interpreted by me shows normal sinus rhythm rate of 95 normal axis no acute changes  Arnaldo NatalPaul F Emrick Hensch, MD 03/06/15 2324

## 2015-02-06 NOTE — H&P (Signed)
[facility] [place] at [facility]   PATIENT NAME: Maurice Clayton    MR#:  [id number]  DATE OF BIRTH:  [birth year]  DATE OF ADMISSION:  02/06/2015  PRIMARY CARE PHYSICIAN: Dr. Einar Crow  REQUESTING/REFERRING PHYSICIAN: Dr. Dorothea Glassman  CHIEF COMPLAINT:   Chief Complaint  Patient presents with  . Weakness    HISTORY OF PRESENT ILLNESS:  Maurice Clayton  is a 55 y.o. male with a known history of cerebral palsy, early onset alzheimer's dementia, depression presents from a group home secondary to weakness, decreased by mouth intake and also right knee pain and abdominal pain. Patient unable to provide any history due to his dementia and also poor verbal communication at baseline. He has been at this current group home for almost 10 months now. Initially he was ambulatory at the walker and more communicative. Over the last 3-4 months, his mobility has decreased. He is currently bedbound. He was able to feed himself with assistance, but in the last month his appetite has been decreased immensely and he is clearly motivated to eat anything. Today he was holding onto his right knee and was wincing and so he was brought to the emergency room for evaluation. In the ER he also had some abdominal pain on examination. A CT of the abdomen done shows severe constipation, a subtle 12 mm left kidney mass and also old right hip fracture. Patient had a fall from his wheelchair about 3 months ago, seen by PCP and pelvic x-rays were done at the time. He complained of significant right hip pain at that time. However the x-rays were clear according to PCP Notes. He is able to move around in bed and there's no tenderness to touch on the right hip. So likely it is an old fracture.  PAST MEDICAL HISTORY:   Past Medical History  Diagnosis Date  . Cerebral palsy (HCC)     bed bound status  . Dementia   . HTN (hypertension)   . Depression   . Vitamin B 12 deficiency   . Hyperthyroidism      PAST SURGICAL HISTORY:   Past Surgical History  Procedure Laterality Date  . Dental procedure    . Foot surgeries      bilateral for cerebral palsy    SOCIAL HISTORY:   Social History  Substance Use Topics  . Smoking status: Never Smoker   . Smokeless tobacco: Not on file  . Alcohol Use: No    FAMILY HISTORY:   Family History  Problem Relation Age of Onset  . Dementia Mother   . CAD Brother   . Aortic aneurysm Brother   . Aortic aneurysm Father     DRUG ALLERGIES:  No Known Allergies  REVIEW OF SYSTEMS:   Review of Systems  Unable to perform ROS: dementia    MEDICATIONS AT HOME:   Prior to Admission medications   Medication Sig Start Date End Date Taking? Authorizing Provider  acetaminophen (TYLENOL) 325 MG tablet Take 325 mg by mouth every 6 (six) hours as needed for mild pain, fever or headache.   Yes Historical Provider, MD  aspirin EC 81 MG tablet Take 81 mg by mouth every evening.   Yes Historical Provider, MD  desmopressin (DDAVP) 0.1 MG tablet Take 0.25 mg by mouth at bedtime.    Yes Historical Provider, MD  escitalopram (LEXAPRO) 20 MG tablet Take 20 mg by mouth daily.   Yes Historical Provider, MD  hydrocortisone (ANUSOL-HC) 2.5 % rectal  cream Place 1 application rectally 3 (three) times daily.   Yes Historical Provider, MD  memantine (NAMENDA XR) 28 MG CP24 24 hr capsule Take 28 mg by mouth every evening.   Yes Historical Provider, MD  mirtazapine (REMERON) 15 MG tablet Take 15 mg by mouth every evening.    Yes Historical Provider, MD  risperiDONE (RISPERDAL) 1 MG tablet Take 1 mg by mouth 3 (three) times daily.   Yes Historical Provider, MD  rivastigmine (EXELON) 4.6 mg/24hr Place 4.6 mg onto the skin daily.   Yes Historical Provider, MD      VITAL SIGNS:  Blood pressure 113/81, pulse 71, temperature 98.5 F (36.9 C), temperature source Oral, resp. rate 15, height 5\' 7"  (1.702 m), weight 48.535 kg (107 lb), SpO2 96 %.  PHYSICAL EXAMINATION:    Physical Exam  GENERAL:  55 y.o.-year-old patient appearing older than the stated age lying in the bed with no acute distress.  EYES: Pupils equal, round, reactive to light and accommodation. No scleral icterus. Extraocular muscles intact.  HEENT: Head atraumatic, normocephalic. Oropharynx and nasopharynx clear. Poor oral hygiene. Bad dentition. NECK:  Supple, no jugular venous distention. No thyroid enlargement, no tenderness.  LUNGS: Normal breath sounds bilaterally, no wheezing, rales,rhonchi or crepitation. No use of accessory muscles of respiration. Decreased bibasilar breath sounds. CARDIOVASCULAR: S1, S2 normal. No murmurs, rubs, or gallops.  ABDOMEN: Soft, nontender, nondistended. Bowel sounds present. No organomegaly or mass.  EXTREMITIES: No pedal edema, cyanosis, or clubbing. Atrophy lower extremities. No tenderness of the knee or hip on the right side. Left foot with toes overlapping - chronic NEUROLOGIC: Due to dementia unable to do a complete neuro exam. Patient is following some simple commands and able to move both upper extremities. Weakness in both lower extremities noted. Sensation is intact.  PSYCHIATRIC: The patient is alert. Not oriented.  SKIN: No obvious rash, lesion, or ulcer.   LABORATORY PANEL:   CBC  Recent Labs Lab 02/06/15 1201  WBC 6.3  HGB 15.2  HCT 46.1  PLT 281   ------------------------------------------------------------------------------------------------------------------  Chemistries   Recent Labs Lab 02/06/15 1201  NA 139  K 3.9  CL 103  CO2 29  GLUCOSE 96  BUN 9  CREATININE 0.55*  CALCIUM 8.8*  AST 11*  ALT 12*  ALKPHOS 101  BILITOT 0.4   ------------------------------------------------------------------------------------------------------------------  Cardiac Enzymes  Recent Labs Lab 02/06/15 1201  TROPONINI <0.03    ------------------------------------------------------------------------------------------------------------------  RADIOLOGY:  Dg Tibia/fibula Right  02/06/2015  CLINICAL DATA:  Increased lethargy. Possible right lower leg injury. Initial encounter. EXAM: RIGHT TIBIA AND FIBULA - 2 VIEW COMPARISON:  None. FINDINGS: No acute bony or joint abnormality is seen. Talonavicular degenerative disease is identified. Musculature of the lower leg appears atrophic. IMPRESSION: No acute abnormality. Atrophic musculature right lower leg. Talonavicular degenerative change. Electronically Signed   By: Drusilla Kannerhomas  Dalessio M.D.   On: 02/06/2015 15:49   Ct Head Wo Contrast  02/06/2015  CLINICAL DATA:  Patient is alert and nonverbal. Seems more lethargic than usual. EXAM: CT HEAD WITHOUT CONTRAST TECHNIQUE: Contiguous axial images were obtained from the base of the skull through the vertex without intravenous contrast. COMPARISON:  CT brain 01/06/2015. FINDINGS: Limited exam due to motion artifact. Ventricles and sulci are stable in appearance with mega cisterna magna re- demonstrated. No evidence for acute cortically based infarct, intracranial hemorrhage, mass lesion or mass-effect. Chronic calcifications are re-demonstrated within the parietal lobes bilaterally. Orbits are unremarkable. Mucosal thickening involving the left greater than right maxillary  sinuses. Mastoid air cells are unremarkable. Calvarium is intact. IMPRESSION: No acute intracranial process. Mega cisterna magna. Mild cortical volume loss. Electronically Signed   By: Annia Belt M.D.   On: 02/06/2015 14:59   Ct Abdomen Pelvis W Contrast  02/06/2015  CLINICAL DATA:  Decreased appetite.  Altered mental status. EXAM: CT ABDOMEN AND PELVIS WITH CONTRAST TECHNIQUE: Multidetector CT imaging of the abdomen and pelvis was performed using the standard protocol following bolus administration of intravenous contrast. CONTRAST:  OMNIPAQUE IOHEXOL 300 MG/ML  SOLN  COMPARISON:  None. FINDINGS: Lower chest: Lung bases are clear. Heart size is normal. Tiny nonspecific pericardial effusion. Hepatobiliary: The liver parenchyma is normal. There is suggestion of faint stones in the gallbladder. Minimal prominence of the intrahepatic bile ducts. Is the patient's bilirubin normal? Pancreas: Normal. Spleen: Normal. Adrenals/Urinary Tract: There is a subtle 12 mm mass on the posterior aspect of the lower pole the left kidney worrisome for malignancy. 8 mm cyst on the upper pole of the left kidney. Adrenal glands are normal. Right kidney is normal. No hydronephrosis. Bladder is normal. Stomach/Bowel: The patient has a massive fecal impaction measuring approximately 10 cm in diameter with marked distention of the rectum. There is extensive stool throughout the entire colon. No small bowel dilatation. Vascular/Lymphatic: Calcification in the distal abdominal aorta and iliac arteries. No adenopathy. Reproductive: Normal. Other: No free air or free fluid. 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 IMPRESSION: 1. Massive fecal impaction with extensive stool throughout the colon. 2. 12 mm mass on the lower pole of the left kidney worrisome for a small malignancy. 3. Probable cholelithiasis. 4. 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 Electronically Signed   By: Francene Boyers M.D.   On: 02/06/2015 15:05   Dg Chest Portable 1 View  02/06/2015  CLINICAL DATA:  Weakness. EXAM: PORTABLE CHEST 1 VIEW COMPARISON:  No prior available for comparison. FINDINGS: Patient is rotated. Mediastinum and hilar structures normal. Mild left base subsegmental atelectasis. Heart size normal. No pleural effusion or pneumothorax. IMPRESSION: Mild left base subsegmental atelectasis . Electronically Signed   By: Maisie Fus  Register   On: 02/06/2015 15:49   Dg Femur, Min 2 Views Right  02/06/2015  CLINICAL DATA:  Nonverbal patient, holding right leg in a  flexed position. EXAM: RIGHT FEMUR 2 VIEWS COMPARISON:  None. FINDINGS: There is a displaced/ comminuted fracture of the right femoral neck, subcapital region with extension into the lateral portion of the right femoral head, and with at least mild impaction at the fracture site. Main component of the right femoral head remains well positioned relative to the acetabulum. Probable associated joint effusion. Mid and distal portions of the right femur appear intact and well aligned throughout. IMPRESSION: Displaced/comminuted fracture of the right femoral neck, centered within the subcapital region but with extension into lateral portion of the right femoral head. At least some degree of associated impaction at the fracture site. Main component of the femoral head remains well positioned relative to the acetabulum. Electronically Signed   By: Bary Richard M.D.   On: 02/06/2015 15:17    EKG:   Orders placed or performed during the hospital encounter of 02/06/15  . ED EKG  . ED EKG  . EKG 12-Lead  . EKG 12-Lead    IMPRESSION AND PLAN:   Maurice Clayton  is a 55 y.o. male with a known history of cerebral palsy, early onset alzheimer's dementia, depression presents from a  group home secondary to weakness, decreased by mouth intake and also right knee pain and abdominal pain.  #1 Severe constipation- massive fecal impaction with extensive stool throughout the colon noted on CT. - manual disimpaction, miralax, dulcolax suppositories and fleets enema prn ordered - f/u KUB in 1-2 days - once constipation is relieved, may be appetite might improve.  #2 Left renal mass-incidental 12 mm mass in the lower pole of left kidney. Renal ultrasound for better evaluation. - outpatient follow-up recommended.  #3 Right hip fracture- no right hip pain at this time. CT revealing old right hip fracture. -We'll get orthopedic consult for evaluation. -Complains of right knee pain. Right knee x-rays did not show any acute  abnormalities.  #4 early onset Alzheimer's dementia with depression-progressively worsening over the last several months. Getting less communicative now. -Continue outpatient medications which include Exelon, Namenda XR, Remeron, risperidone and Lexapro.  #5 DVT prophylaxis- on Lovenox   Social worker consult requested. Patient's sister is his guardian. Message left that he is being admitted.    All the records are reviewed and case discussed with ED provider. Management plans discussed with the patient, family and they are in agreement.  CODE STATUS: Full Code  TOTAL TIME TAKING CARE OF THIS PATIENT: 50 minutes.    Enid Baas M.D on 02/06/2015 at 5:02 PM  Between 7am to 6pm - Pager - [phone]  After 6pm go to [email or web address] - password [email or web address] [facility]  [facility] [place] [facility]  Office  [phone]  CC: Primary care physician; No primary care provider on file.

## 2015-02-06 NOTE — ED Notes (Signed)
Patient transported to CT 

## 2015-02-07 ENCOUNTER — Inpatient Hospital Stay: Payer: Medicare Other

## 2015-02-07 DIAGNOSIS — L8990 Pressure ulcer of unspecified site, unspecified stage: Secondary | ICD-10-CM | POA: Insufficient documentation

## 2015-02-07 LAB — CBC
HCT: 44 % (ref 40.0–52.0)
Hemoglobin: 14.8 g/dL (ref 13.0–18.0)
MCH: 31.7 pg (ref 26.0–34.0)
MCHC: 33.7 g/dL (ref 32.0–36.0)
MCV: 94 fL (ref 80.0–100.0)
Platelets: 259 10*3/uL (ref 150–440)
RBC: 4.68 MIL/uL (ref 4.40–5.90)
RDW: 13.1 % (ref 11.5–14.5)
WBC: 7.6 10*3/uL (ref 3.8–10.6)

## 2015-02-07 LAB — BASIC METABOLIC PANEL
Anion gap: 4 — ABNORMAL LOW (ref 5–15)
BUN: 10 mg/dL (ref 6–20)
CALCIUM: 8.3 mg/dL — AB (ref 8.9–10.3)
CHLORIDE: 106 mmol/L (ref 101–111)
CO2: 31 mmol/L (ref 22–32)
CREATININE: 0.83 mg/dL (ref 0.61–1.24)
Glucose, Bld: 100 mg/dL — ABNORMAL HIGH (ref 65–99)
Potassium: 3.9 mmol/L (ref 3.5–5.1)
SODIUM: 141 mmol/L (ref 135–145)

## 2015-02-07 MED ORDER — HYDROCODONE-ACETAMINOPHEN 5-325 MG PO TABS
1.0000 | ORAL_TABLET | Freq: Four times a day (QID) | ORAL | Status: DC | PRN
  Administered 2015-02-07 (×2): 1 via ORAL
  Filled 2015-02-07 (×3): qty 1

## 2015-02-07 NOTE — Consult Note (Signed)
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 He also suffers from dementia. He had previously been an ambulator and suffered a fall a few months ago had x-rayed her to clinic that was negative for fracture  On exam he is an a flexed position at the hips and knees and has pain and with any leg motion on the right side. He has palpable pulses able flex extend toes  X-ray show what appears to be a subacute femoral neck fracture with shortening consistent with fracture nondisplaced fracture from a few months ago that now has progressed to displace along with some mild degenerative change  Impression is now subacute/chronic right hip fracture femoral neck displaced  Recommendation would be if medically stable if his renal mass is not something malignant that requires further treatment, recommendation would  be for anterior total hip to regain ambulatory status

## 2015-02-07 NOTE — Plan of Care (Signed)
Problem: Education: Goal: Knowledge of [facility] Education information/materials will improve Outcome: Not Met (add Reason) Patient is non verbal.

## 2015-02-07 NOTE — Progress Notes (Signed)
[facility] [place] [facility]   PATIENT NAME: Maurice Clayton    MR#:  [id number]  DATE OF BIRTH:  [birth year]  SUBJECTIVE:admitted for weakness,.  CHIEF COMPLAINT:   Chief Complaint  Patient presents with  . Weakness    REVIEW OF SYSTEMS:   ROS CONSTITUTIONAL: No fever, fatigue or weakness.  EYES: No blurred or double vision.  EARS, NOSE, AND THROAT: No tinnitus or ear pain.  RESPIRATORY: No cough, shortness of breath, wheezing or hemoptysis.  CARDIOVASCULAR: No chest pain, orthopnea, edema.  GASTROINTESTINAL: No nausea, vomiting, diarrhea or abdominal pain.  GENITOURINARY: No dysuria, hematuria.  ENDOCRINE: No polyuria, nocturia,  HEMATOLOGY: No anemia, easy bruising or bleeding SKIN: No rash or lesion. MUSCULOSKELETAL: No joint pain or arthritis.   NEUROLOGIC: No tingling, numbness, weakness.  PSYCHIATRY: No anxiety or depression.   DRUG ALLERGIES:  No Known Allergies  VITALS:  Blood pressure 129/84, pulse 90, temperature 98.3 F (36.8 C), temperature source Oral, resp. rate 17, height 5\' 7"  (1.702 m), weight 48.535 kg (107 lb), SpO2 100 %.  PHYSICAL EXAMINATION:  GENERAL:  55 y.o.-year-old patient lying in the bed with no acute distress.  EYES: Pupils equal, round, reactive to light and accommodation. No scleral icterus. Extraocular muscles intact.  HEENT: Head atraumatic, normocephalic. Oropharynx and nasopharynx clear.  NECK:  Supple, no jugular venous distention. No thyroid enlargement, no tenderness.  LUNGS: Normal breath sounds bilaterally, no wheezing, rales,rhonchi or crepitation. No use of accessory muscles of respiration.  CARDIOVASCULAR: S1, S2 normal. No murmurs, rubs, or gallops.  ABDOMEN: Soft, nontender, nondistended. Bowel sounds present. No organomegaly or mass.  EXTREMITIES:has hip pain with any leg motion on right side,'flexed position at the hips and knees  NEUROLOGIC: Cranial nerves II through XII are intact. Muscle  strength 5/5 in all extremities. Sensation intact. Gait not checked.  PSYCHIATRIC: The patient is alert and oriented x 3.  SKIN: No obvious rash, lesion, or ulcer.    LABORATORY PANEL:   CBC  Recent Labs Lab 02/07/15 0319  WBC 7.6  HGB 14.8  HCT 44.0  PLT 259   ------------------------------------------------------------------------------------------------------------------  Chemistries   Recent Labs Lab 02/06/15 1201 02/07/15 0319  NA 139 141  K 3.9 3.9  CL 103 106  CO2 29 31  GLUCOSE 96 100*  BUN 9 10  CREATININE 0.55* 0.83  CALCIUM 8.8* 8.3*  AST 11*  --   ALT 12*  --   ALKPHOS 101  --   BILITOT 0.4  --    ------------------------------------------------------------------------------------------------------------------  Cardiac Enzymes  Recent Labs Lab 02/06/15 1201  TROPONINI <0.03   ------------------------------------------------------------------------------------------------------------------  RADIOLOGY:  Dg Tibia/fibula Right  02/06/2015  CLINICAL DATA:  Increased lethargy. Possible right lower leg injury. Initial encounter. EXAM: RIGHT TIBIA AND FIBULA - 2 VIEW COMPARISON:  None. FINDINGS: No acute bony or joint abnormality is seen. Talonavicular degenerative disease is identified. Musculature of the lower leg appears atrophic. IMPRESSION: No acute abnormality. Atrophic musculature right lower leg. Talonavicular degenerative change. Electronically Signed   By: Drusilla Kannerhomas  Dalessio M.D.   On: 02/06/2015 15:49   Ct Head Wo Contrast  02/06/2015  CLINICAL DATA:  Patient is alert and nonverbal. Seems more lethargic than usual. EXAM: CT HEAD WITHOUT CONTRAST TECHNIQUE: Contiguous axial images were obtained from the base of the skull through the vertex without intravenous contrast. COMPARISON:  CT brain 01/06/2015. FINDINGS: Limited exam due to motion artifact. Ventricles and sulci are stable in appearance with mega cisterna magna re- demonstrated. No  evidence for  acute cortically based infarct, intracranial hemorrhage, mass lesion or mass-effect. Chronic calcifications are re-demonstrated within the parietal lobes bilaterally. Orbits are unremarkable. Mucosal thickening involving the left greater than right maxillary sinuses. Mastoid air cells are unremarkable. Calvarium is intact. IMPRESSION: No acute intracranial process. Mega cisterna magna. Mild cortical volume loss. Electronically Signed   By: Annia Belt M.D.   On: 02/06/2015 14:59   Ct Abdomen Pelvis W Contrast  02/06/2015  CLINICAL DATA:  Decreased appetite.  Altered mental status. EXAM: CT ABDOMEN AND PELVIS WITH CONTRAST TECHNIQUE: Multidetector CT imaging of the abdomen and pelvis was performed using the standard protocol following bolus administration of intravenous contrast. CONTRAST:  OMNIPAQUE IOHEXOL 300 MG/ML  SOLN COMPARISON:  None. FINDINGS: Lower chest: Lung bases are clear. Heart size is normal. Tiny nonspecific pericardial effusion. Hepatobiliary: The liver parenchyma is normal. There is suggestion of faint stones in the gallbladder. Minimal prominence of the intrahepatic bile ducts. Is the patient's bilirubin normal? Pancreas: Normal. Spleen: Normal. Adrenals/Urinary Tract: There is a subtle 12 mm mass on the posterior aspect of the lower pole the left kidney worrisome for malignancy. 8 mm cyst on the upper pole of the left kidney. Adrenal glands are normal. Right kidney is normal. No hydronephrosis. Bladder is normal. Stomach/Bowel: The patient has a massive fecal impaction measuring approximately 10 cm in diameter with marked distention of the rectum. There is extensive stool throughout the entire colon. No small bowel dilatation. Vascular/Lymphatic: Calcification in the distal abdominal aorta and iliac arteries. No adenopathy. Reproductive: Normal. Other: No free air or free fluid. 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 IMPRESSION: 1. Massive fecal  impaction with extensive stool throughout the colon. 2. 12 mm mass on the lower pole of the left kidney worrisome for a small malignancy. 3. Probable cholelithiasis. 4. 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 Electronically Signed   By: Francene Boyers M.D.   On: 02/06/2015 15:05   US Renal  02/07/2015  CLINICAL DATA:  Left renal mass on CT. Subtle 12 mm mass appreciated along the posterior cortical margin of the lower left kidney on recent CT. EXAM: RENAL / URINARY TRACT ULTRASOUND COMPLETE COMPARISON:  CT dated 02/06/2015. FINDINGS: Right Kidney: Length: 10.5 cm. Echogenicity within normal limits. No mass, stone or hydronephrosis visualized. Left Kidney: Length: 11.5 cm. Subtle isoechoic-to-hyperechoic mass appreciated within the lower pole of the left kidney, measuring 2.3 x 2 cm, a possible correlate for the recent CT finding. Left kidney otherwise unremarkable. No renal stone or hydronephrosis. Bladder: Avascular material is present along the dependent/posterior wall of the bladder most suggestive of debris. Bladder otherwise unremarkable. IMPRESSION: 1. Subtle isoechoic-to-hyperechoic mass within the lower pole of the LEFT kidney, measuring 2.3 x 2 cm, not adequately visualized for definitive characterization, a possible correlate for the 1.2 cm mass seen on recent CT. Recommend more definitive characterization with either renal protocol CT (with and without contrast) or renal protocol MRI. 2. Right kidney appears normal. 3. Probable debris within the dependent aspects of the bladder. Bladder otherwise unremarkable. Electronically Signed   By: Bary Richard M.D.   On: 02/07/2015 09:37   Dg Chest Portable 1 View  02/06/2015  CLINICAL DATA:  Weakness. EXAM: PORTABLE CHEST 1 VIEW COMPARISON:  No prior available for comparison. FINDINGS: Patient is rotated. Mediastinum and hilar structures normal. Mild left base subsegmental atelectasis. Heart size normal. No pleural effusion or  pneumothorax. IMPRESSION: Mild left base subsegmental  atelectasis . Electronically Signed   By: Maisie Fus  Register   On: 02/06/2015 15:49   Dg Femur, Min 2 Views Right  02/06/2015  CLINICAL DATA:  Nonverbal patient, holding right leg in a flexed position. EXAM: RIGHT FEMUR 2 VIEWS COMPARISON:  None. FINDINGS: There is a displaced/ comminuted fracture of the right femoral neck, subcapital region with extension into the lateral portion of the right femoral head, and with at least mild impaction at the fracture site. Main component of the right femoral head remains well positioned relative to the acetabulum. Probable associated joint effusion. Mid and distal portions of the right femur appear intact and well aligned throughout. IMPRESSION: Displaced/comminuted fracture of the right femoral neck, centered within the subcapital region but with extension into lateral portion of the right femoral head. At least some degree of associated impaction at the fracture site. Main component of the femoral head remains well positioned relative to the acetabulum. Electronically Signed   By: Bary Richard M.D.   On: 02/06/2015 15:17    EKG:   Orders placed or performed during the hospital encounter of 02/06/15  . ED EKG  . ED EKG  . EKG 12-Lead  . EKG 12-Lead    ASSESSMENT AND PLAN:  1.Angello Chien is a 56 y.o. male with a known history of cerebral palsy, early onset alzheimer's dementia, depression presents from a group home secondary to weakness, decreased by mouth intake and also right knee pain and abdominal pain.  #1 Severe constipation- massive fecal impaction with extensive stool throughout the colon noted on CT. -continue stool softeners, - f/u KUB in 1-2 days - once constipation is relieved, may be appetite might improve.  #2 Left renal mass-incidental 12 mm mass in the lower pole of left kidney.follwo renal sono showed Subtle isoechoic-to-hyperechoic mass within the lower pole of the LEFT kidney,  measuring 2.3 x 2 cm, not adequately visualized for definitive characterization, a possible correlate for the 1.2 cm mass seen on recent CT. Recommend more definitive characterization with either renal protocol CT (with and without contrast) or renal protocol MRI. 2. Right kidney appears normal. 3. Probable debris within the dependent aspects of the bladder. Bladder otherwise unremarkable.  #3 Right hip fracture- X-ray show what appears to be a subacute femoral neck fracture with shortening consistent with fracture nondisplaced fracture from a few months ago that now has progressed to displace along with some mild degenerative change  Impression is now subacute/chronic right hip fracture femoral neck displaced  Recommendation would be if medically stable if his renal mass is not something malignant that requires further treatment, recommendation would be for anterior total hip to regain ambulatory status  -Complains of right knee pain. Right knee x-rays did not show any acute abnormalities.  #4 .early onset Alzheimer's dementia with depression-progressively worsening over the last several months. Getting less communicative now. Continue  Pyridostigmine., Namenda, Risperdal, Exelon patch.    All the records are reviewed and case discussed with Care Management/Social Workerr. Management plans discussed with the patient, family and they are in agreement.  CODE STATUS: full  TOTAL TIME TAKING CARE OF THIS PATIENT: 35 minutes.   POSSIBLE D/C IN 1-2 DAYS, DEPENDING ON CLINICAL CONDITION.   Katha Hamming M.D on 02/07/2015 at 12:47 PM  Between 7am to 6pm - Pager - [phone]  After 6pm go to [email or web address] - password [email or web address] [facility]  [facility]  Office  [phone]  CC: Primary care physician; No primary care provider on file.   Note: This  dictation was prepared with Dragon dictation along with smaller phrase technology. Any transcriptional errors that result from  this process are unintentional.

## 2015-02-07 NOTE — Clinical Social Work Note (Addendum)
Clinical Social Work Assessment  Patient Details  Name: Maurice LericheBarry W Soloway MRN: [id number] Date of Birth: 05-16-1960  Date of referral:  02/07/15               Reason for consult:  Facility Placement (from [facility])                Permission sought to share information with:  Facility Industrial/product designerContact Representative Permission granted to share information::  Yes, Verbal Permission Granted (from guardian )  Name::        Agency::     Relationship::     Contact Information:     Housing/Transportation Living arrangements for the past 2 months:  Group Home Source of Information:  Medical Team, Guardian Patient Interpreter Needed:  None Criminal Activity/Legal Involvement Pertinent to Current Situation/Hospitalization:  No - Comment as needed Significant Relationships:  Siblings, Merchandiser, retailCommunity Support, Other Family Members Lives with:  Facility Resident Do you feel safe going back to the place where you live?    Need for family participation in patient care:  Yes (Comment)  Care giving concerns:  Family is concerned the [facility] will not take patient back.   Social Worker assessment / plan:  Visual merchandiserClinical Social Worker (CSW) consult, patient is from [facility].   Patient was not alert or oriented due to his dementia.    (Additional Information provided by patient's guardian Maurice Clayton over the phone).  CSW introduced self and explained role of CSW department.  [facility].  Prior to [facility] he lived in another group home for 1 year and was at home until his care taker passed away.  Patient's sister and brother in-law Maurice Clayton and Maurice Clayton are his legal guardians.  Per Maurice Clayton patient's health has continued to decline and they have been looking for a higher level of care but have been turned down by several facilities.  States they do not believe [facility] will allow patient to return at discharge.  Also concern with patient hip fracture.   Guardian would like CSW  to assist them with locating a higher level of care that will meet patient's needs.( [facility] for Maurice Clayton[facility] [place] [facility]   CSW will complete FL2, and fax out in anticipation of patient going to a higher level of care at discharge due to his right hip fracure.  Also concern that patient does not have a PASARR and it will take several days to get one.     Employment status:  Disabled (Comment on whether or not currently receiving Disability) Insurance information:  [place]  Not assessed at this time Information / Referral to community resources:  [facility], Other (Comment Required) (long-term care)  Patient/Family's Response to care:  Patient's guardian was appreciative of information provided by CSW.   Patient/Family's Understanding of and Emotional Response to Diagnosis, Current Treatment, and Prognosis:  Patient's guardian understands that he is under continued medical work up at this time.  Emotional Assessment Appearance:  Appears stated age Attitude/Demeanor/Rapport:  Lethargic, Unable to Assess Affect (typically observed):  Unable to Assess Orientation:    Alcohol / Substance use:    Psych involvement (Current and /or in the community):  No (Comment)  Discharge Needs  Concerns to be addressed:  Care Coordination, Discharge Planning Concerns Readmission within the last 30 days:  No Current discharge risk:  Chronically ill, Dependent with Mobility, Cognitively Impaired Barriers to Discharge:  Continued Medical Work  up   Soundra Pilon, LCSW 02/07/2015, 3:24 PM

## 2015-02-07 NOTE — [place] FL2 (Signed)
[place] MEDICAID FL2 LEVEL OF CARE SCREENING TOOL     IDENTIFICATION  Patient Name: Maurice Clayton Birthdate: [birth year] Sex: male Admission Date (Current Location): 02/06/2015  [facility] and [place] Number:  [address]  ([phone] [facility]) Facility and Address:  [facility], [address], [place], [place] [address]      Provider Number: [id number]  Attending Physician Name and Address:  Katha Hamming, MD  Relative Name and Phone Number:       Current Level of Care: Hospital Recommended Level of Care: Skilled Nursing Facility Prior Approval Number:    Date Approved/Denied:   PASRR Number:    Discharge Plan: SNF    Current Diagnoses: Patient Active Problem List   Diagnosis Date Noted  . Pressure ulcer 02/07/2015  . Abdominal pain 02/06/2015    Orientation RESPIRATION BLADDER Height & Weight       Normal Incontinent 5\' 7"  (170.2 cm) 107 lbs.  BEHAVIORAL SYMPTOMS/MOOD NEUROLOGICAL BOWEL NUTRITION STATUS      Incontinent Diet (regular )  AMBULATORY STATUS COMMUNICATION OF NEEDS Skin   Total Care Verbally (difficult to understand) PU Stage and Appropriate Care                       Personal Care Assistance Level of Assistance  Bathing, Feeding, Dressing, Total care Bathing Assistance: Maximum assistance Feeding assistance: Limited assistance Dressing Assistance: Maximum assistance Total Care Assistance: Maximum assistance   Functional Limitations Info  Sight, Hearing, Speech Sight Info: Impaired Hearing Info: Adequate Speech Info: Impaired    SPECIAL CARE FACTORS FREQUENCY                       Contractures      Additional Factors Info  Allergies   Allergies Info:  (NKA)           Current Medications (02/07/2015):  This is the current hospital active medication list Current Facility-Administered Medications  Medication Dose Route Frequency Provider Last Rate Last Dose  . 0.9 %  sodium chloride infusion    Intravenous Continuous Enid Baas, MD 75 mL/hr at 02/06/15 1839    . acetaminophen (TYLENOL) tablet 650 mg  650 mg Oral Q6H PRN Enid Baas, MD   650 mg at 02/07/15 1322   Or  . acetaminophen (TYLENOL) suppository 650 mg  650 mg Rectal Q6H PRN Enid Baas, MD      . aspirin EC tablet 81 mg  81 mg Oral QPM Enid Baas, MD   81 mg at 02/06/15 1857  . bisacodyl (DULCOLAX) suppository 10 mg  10 mg Rectal Daily PRN Enid Baas, MD      . desmopressin (DDAVP) tablet 0.2 mg  0.2 mg Oral QHS Enid Baas, MD   0.2 mg at 02/06/15 2144  . docusate sodium (COLACE) capsule 100 mg  100 mg Oral BID Enid Baas, MD   100 mg at 02/07/15 0939  . enoxaparin (LOVENOX) injection 40 mg  40 mg Subcutaneous Q24H Enid Baas, MD   40 mg at 02/06/15 1857  . escitalopram (LEXAPRO) tablet 20 mg  20 mg Oral Daily Enid Baas, MD   20 mg at 02/07/15 0939  . HYDROcodone-acetaminophen (NORCO/VICODIN) 5-325 MG per tablet 1 tablet  1 tablet Oral Q6H PRN Katha Hamming, MD   1 tablet at 02/07/15 1459  . hydrocortisone (ANUSOL-HC) 2.5 % rectal cream 1 application  1 application Rectal TID Enid Baas, MD   1 application at 02/07/15 0940  . magnesium  hydroxide (MILK OF MAGNESIA) suspension 30 mL  30 mL Oral Daily PRN Enid Baasadhika Kalisetti, MD      . memantine (NAMENDA XR) 24 hr capsule 28 mg  28 mg Oral QPM Enid Baasadhika Kalisetti, MD   28 mg at 02/06/15 1858  . mirtazapine (REMERON) tablet 15 mg  15 mg Oral QPM Enid Baasadhika Kalisetti, MD   15 mg at 02/06/15 1857  . ondansetron (ZOFRAN) tablet 4 mg  4 mg Oral Q6H PRN Enid Baasadhika Kalisetti, MD       Or  . ondansetron (ZOFRAN) injection 4 mg  4 mg Intravenous Q6H PRN Enid Baasadhika Kalisetti, MD      . polyethylene glycol powder (GLYCOLAX/MIRALAX) container 255 g  1 Container Oral Once Enid Baasadhika Kalisetti, MD      . risperiDONE (RISPERDAL) tablet 1 mg  1 mg Oral TID Enid Baasadhika Kalisetti, MD   1 mg at 02/07/15 1459  . rivastigmine (EXELON) 4.6 mg/24hr  4.6 mg  4.6 mg Transdermal Daily Enid Baasadhika Kalisetti, MD   4.6 mg at 02/07/15 0940  . sodium phosphate (FLEET) 7-19 GM/118ML enema 1 enema  1 enema Rectal Once PRN Enid Baasadhika Kalisetti, MD         Discharge Medications: Please see discharge summary for a list of discharge medications.  Relevant Imaging Results:  Relevant Lab Results:   Additional Information  (VQ:[id number])  Soundra PilonMoore, Nayleen Janosik H, LCSW

## 2015-02-08 ENCOUNTER — Inpatient Hospital Stay: Payer: Medicare Other

## 2015-02-08 DIAGNOSIS — K5900 Constipation, unspecified: Secondary | ICD-10-CM

## 2015-02-08 DIAGNOSIS — R1084 Generalized abdominal pain: Secondary | ICD-10-CM

## 2015-02-08 DIAGNOSIS — N2889 Other specified disorders of kidney and ureter: Secondary | ICD-10-CM

## 2015-02-08 LAB — MRSA PCR SCREENING: MRSA by PCR: NEGATIVE

## 2015-02-08 MED ORDER — SODIUM CHLORIDE 0.9 % IV SOLN
INTRAVENOUS | Status: AC
  Administered 2015-02-08: 11:00:00 via INTRAVENOUS

## 2015-02-08 MED ORDER — CEFAZOLIN SODIUM 1-5 GM-% IV SOLN
1.0000 g | Freq: Once | INTRAVENOUS | Status: DC
  Filled 2015-02-08 (×2): qty 50

## 2015-02-08 MED ORDER — HYDROCODONE-ACETAMINOPHEN 5-325 MG PO TABS
2.0000 | ORAL_TABLET | Freq: Four times a day (QID) | ORAL | Status: DC | PRN
  Administered 2015-02-08 – 2015-02-09 (×4): 2 via ORAL
  Filled 2015-02-08 (×5): qty 2

## 2015-02-08 NOTE — Clinical Social Work Placement (Signed)
   CLINICAL SOCIAL WORK PLACEMENT  NOTE  Date:  02/08/2015  Patient Details  Name: Maurice LericheBarry W Browe MRN: [id number] Date of Birth: 1960-03-29  Clinical Social Work is seeking post-discharge placement for this patient at the Skilled  Nursing Facility level of care (*CSW will initial, date and re-position this form in  chart as items are completed):  No   Patient/family provided with [facility] Clinical Social Work Department's list of facilities offering this level of care within the geographic area requested by the patient (or if unable, by the patient's family).  Yes   Patient/family informed of their freedom to choose among providers that offer the needed level of care, that participate in Medicare, Medicaid or managed care program needed by the patient, have an available bed and are willing to accept the patient.  Yes   Patient/family informed of [place]'s ownership interest in [facility] and [facility], as well as of the fact that they are under no obligation to receive care at these facilities.  PASRR submitted to EDS on 02/07/15     PASRR number received on       Existing PASRR number confirmed on       FL2 transmitted to all facilities in geographic area requested by pt/family on 02/07/15     FL2 transmitted to all facilities within larger geographic area on 02/08/15     Patient informed that his/her managed care company has contracts with or will negotiate with certain facilities, including the following:            Patient/family informed of bed offers received.  Patient chooses bed at       Physician recommends and patient chooses bed at      Patient to be transferred to   on  .  Patient to be transferred to facility by       Patient family notified on   of transfer.  Name of family member notified:        PHYSICIAN       Additional Comment:    _______________________________________________ Haig ProphetMorgan, Sandia Pfund G, LCSW 02/08/2015, 8:48 AM

## 2015-02-08 NOTE — Progress Notes (Signed)
Patient continues to have severe pain as a flexed hip with his spasticity. 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 I discussed situation with his guardian and plan right total hip tomorrow with hopefully resolution of his impaction prior to then. Anterior approach should prevent postop dislocation with his flexion primary need psoas release as well

## 2015-02-08 NOTE — Progress Notes (Signed)
Gave patient fleet enema which resulted in 2 very large bowel movements.

## 2015-02-08 NOTE — Consult Note (Signed)
Urology Consult  Referring physician: Dr. Vianne Bulls Reason for referral: renal mass  Chief Complaint: abdominal pain  History of Present Illness: Mr Maurice Clayton is a 55yo with a hx of cerebal palsy and severe dementia who was admitted with weakness and abdominal pain. He was also found to have a fractured hip. He is in a group home. He underwent CT scan which showed severe constipation and a 21m left lower pole renal mass. He denies any LUTS, no gross hematuria. He is a very poor historian and Maurice history was obtained from Maurice Clayton. The patients maternal grandmother had a hx of renal mass. He is currently undergoing treatment for constipation and is being considered for repair of Maurice hip fracture  Past Medical History  Diagnosis Date  . Cerebral palsy ([place]     bed bound status  . Dementia   . HTN (hypertension)   . Depression   . Vitamin B 12 deficiency   . Hyperthyroidism    Past Surgical History  Procedure Laterality Date  . Dental procedure    . Foot surgeries      bilateral for cerebral palsy    Medications: I have reviewed the patient's current medications. Allergies: No Known Allergies  Family History  Problem Relation Age of Onset  . Dementia Mother   . CAD Brother   . Aortic aneurysm Brother   . Aortic aneurysm Father    Social History:  reports that he has never smoked. He does not have any smokeless tobacco history on file. He reports that he does not drink alcohol or use illicit drugs.  Review of Systems  Unable to perform ROS: mental acuity    Physical Exam:  Vital signs in last 24 hours: Temp:  [98.2 F (36.8 C)-98.8 F (37.1 C)] 98.3 F (36.8 C) (01/08 1937) Pulse Rate:  [70-102] 102 (01/08 1937) Resp:  [16-18] 18 (01/08 1937) BP: (108-128)/(65-95) 128/95 mmHg (01/08 1937) SpO2:  [95 %-98 %] 96 % (01/08 1937) Physical Exam  Constitutional: He appears well-developed and well-nourished.  HENT:  Head: Normocephalic and atraumatic.  Eyes: EOM are normal.  Pupils are equal, round, and reactive to light.  Neck: Normal range of motion. No thyromegaly present.  Cardiovascular: Normal rate and regular rhythm.   Respiratory: Effort normal. No respiratory distress.  GI: Soft. He exhibits distension. He exhibits no mass. There is tenderness. There is no rebound and no guarding.  Genitourinary: Penis normal.  Neurological: He is alert.  Skin: Skin is warm and dry.  Psychiatric: Maurice affect is inappropriate. He is withdrawn. He expresses inappropriate judgment.    Laboratory Data:  Results for orders placed or performed during the hospital encounter of 02/06/15 (from the past 72 hour(s))  CBC with Differential     Status: None   Collection Time: 02/06/15 12:01 PM  Result Value Ref Range   WBC 6.3 3.8 - 10.6 K/uL   RBC 5.08 4.40 - 5.90 MIL/uL   Hemoglobin 15.2 13.0 - 18.0 g/dL   HCT 46.1 40.0 - 52.0 %   MCV 90.7 80.0 - 100.0 fL   MCH 30.0 26.0 - 34.0 pg   MCHC 33.1 32.0 - 36.0 g/dL   RDW 13.1 11.5 - 14.5 %   Platelets 281 150 - 440 K/uL   Neutrophils Relative % 67 %   Neutro Abs 4.3 1.4 - 6.5 K/uL   Lymphocytes Relative 23 %   Lymphs Abs 1.4 1.0 - 3.6 K/uL   Monocytes Relative 7 %   Monocytes Absolute  0.4 0.2 - 1.0 K/uL   Eosinophils Relative 2 %   Eosinophils Absolute 0.1 0 - 0.7 K/uL   Basophils Relative 1 %   Basophils Absolute 0.0 0 - 0.1 K/uL  Comprehensive metabolic panel     Status: Abnormal   Collection Time: 02/06/15 12:01 PM  Result Value Ref Range   Sodium 139 135 - 145 mmol/L   Potassium 3.9 3.5 - 5.1 mmol/L   Chloride 103 101 - 111 mmol/L   CO2 29 22 - 32 mmol/L   Glucose, Bld 96 65 - 99 mg/dL   BUN 9 6 - 20 mg/dL   Creatinine, Ser 0.55 (L) 0.61 - 1.24 mg/dL   Calcium 8.8 (L) 8.9 - 10.3 mg/dL   Total Protein 6.2 (L) 6.5 - 8.1 g/dL   Albumin 3.5 3.5 - 5.0 g/dL   AST 11 (L) 15 - 41 U/L   ALT 12 (L) 17 - 63 U/L   Alkaline Phosphatase 101 38 - 126 U/L   Total Bilirubin 0.4 0.3 - 1.2 mg/dL   GFR calc non Af Amer >60 >60  mL/min   GFR calc Af Amer >60 >60 mL/min    Comment: (NOTE) The eGFR has been calculated using the CKD EPI equation. This calculation has not been validated in all clinical situations. eGFR's persistently <60 mL/min signify possible Chronic Kidney Disease.    Anion gap 7 5 - 15  Troponin I     Status: None   Collection Time: 02/06/15 12:01 PM  Result Value Ref Range   Troponin I <0.03 <0.031 ng/mL    Comment:        NO INDICATION OF MYOCARDIAL INJURY.   Sedimentation rate     Status: None   Collection Time: 02/06/15 12:01 PM  Result Value Ref Range   Sed Rate 2 0 - 20 mm/hr  Urinalysis complete, with microscopic     Status: Abnormal   Collection Time: 02/06/15  1:25 PM  Result Value Ref Range   Color, Urine YELLOW (A) YELLOW   APPearance CLEAR (A) CLEAR   Glucose, UA NEGATIVE NEGATIVE mg/dL   Bilirubin Urine NEGATIVE NEGATIVE   Ketones, ur NEGATIVE NEGATIVE mg/dL   Specific Gravity, Urine 1.011 1.005 - 1.030   Hgb urine dipstick NEGATIVE NEGATIVE   pH 6.0 5.0 - 8.0   Protein, ur NEGATIVE NEGATIVE mg/dL   Nitrite NEGATIVE NEGATIVE   Leukocytes, UA NEGATIVE NEGATIVE   RBC / HPF 0-5 0 - 5 RBC/hpf   WBC, UA 0-5 0 - 5 WBC/hpf   Bacteria, UA NONE SEEN NONE SEEN   Squamous Epithelial / LPF NONE SEEN NONE SEEN   Mucous PRESENT   Basic metabolic panel     Status: Abnormal   Collection Time: 02/07/15  3:19 AM  Result Value Ref Range   Sodium 141 135 - 145 mmol/L   Potassium 3.9 3.5 - 5.1 mmol/L   Chloride 106 101 - 111 mmol/L   CO2 31 22 - 32 mmol/L   Glucose, Bld 100 (H) 65 - 99 mg/dL   BUN 10 6 - 20 mg/dL   Creatinine, Ser 0.83 0.61 - 1.24 mg/dL   Calcium 8.3 (L) 8.9 - 10.3 mg/dL   GFR calc non Af Amer >60 >60 mL/min   GFR calc Af Amer >60 >60 mL/min    Comment: (NOTE) The eGFR has been calculated using the CKD EPI equation. This calculation has not been validated in all clinical situations. eGFR's persistently <60 mL/min signify possible Chronic Kidney  Disease.     Anion gap 4 (L) 5 - 15  CBC     Status: None   Collection Time: 02/07/15  3:19 AM  Result Value Ref Range   WBC 7.6 3.8 - 10.6 K/uL   RBC 4.68 4.40 - 5.90 MIL/uL   Hemoglobin 14.8 13.0 - 18.0 g/dL   HCT 44.0 40.0 - 52.0 %   MCV 94.0 80.0 - 100.0 fL   MCH 31.7 26.0 - 34.0 pg   MCHC 33.7 32.0 - 36.0 g/dL   RDW 13.1 11.5 - 14.5 %   Platelets 259 150 - 440 K/uL  MRSA PCR Screening     Status: None   Collection Time: 02/08/15  3:32 PM  Result Value Ref Range   MRSA by PCR NEGATIVE NEGATIVE    Comment:        The GeneXpert MRSA Assay (FDA approved for NASAL specimens only), is one component of a comprehensive MRSA colonization surveillance program. It is not intended to diagnose MRSA infection nor to guide or monitor treatment for MRSA infections.    Recent Results (from the past 240 hour(s))  MRSA PCR Screening     Status: None   Collection Time: 02/08/15  3:32 PM  Result Value Ref Range Status   MRSA by PCR NEGATIVE NEGATIVE Final    Comment:        The GeneXpert MRSA Assay (FDA approved for NASAL specimens only), is one component of a comprehensive MRSA colonization surveillance program. It is not intended to diagnose MRSA infection nor to guide or monitor treatment for MRSA infections.    Creatinine:  Recent Labs  02/06/15 1201 02/07/15 0319  CREATININE 0.55* 0.83   Baseline Creatinine: unknown  Impression/Assessment:  54yo with a solid renal mass  Plan:  I discussed the various treatment options with Maurice Clayton including surveillance, partial nephrectomy, and ablation. After discussing treatment options Maurice Clayton want to pursue active surveillance. The patient should followup with urology 2 weeks after discharge.  Alexius Ellington L 02/08/2015, 9:22 PM

## 2015-02-08 NOTE — Progress Notes (Signed)
[facility] [place] [facility]   PATIENT NAME: Maurice Clayton    MR#:  [id number]  DATE OF BIRTH:  27-Jun-1960  SUBJECTIVE :c/p hip pain.has constipation,and found to have left renal mass. Poor historian .according to RN  Has constipation ,poor po intake,.spoke with pts POA Mr.[name] ,he was informed about  His problems and right hip fracture .  CHIEF COMPLAINT:   Chief Complaint  Patient presents with  . Weakness    REVIEW OF SYSTEMS:   Review of Systems  Unable to perform ROS: dementia   CONSTITUTIONAL: No fever, fatigue or weakness.  EYES: No blurred or double vision.  EARS, NOSE, AND THROAT: No tinnitus or ear pain.  RESPIRATORY: No cough, shortness of breath, wheezing or hemoptysis.  CARDIOVASCULAR: No chest pain, orthopnea, edema.  GASTROINTESTINAL: No nausea, vomiting, diarrhea or abdominal pain. Has constipation. GENITOURINARY: No dysuria, hematuria.  ENDOCRINE: No polyuria, nocturia,  HEMATOLOGY: No anemia, easy bruising or bleeding SKIN: No rash or lesion. MUSCULOSKELETAL: c/o right hip pain,  NEUROLOGIC: No tingling, numbness, weakness.  PSYCHIATRY: No anxiety or depression.   DRUG ALLERGIES:  No Known Allergies  VITALS:  Blood pressure 113/69, pulse 82, temperature 98.8 F (37.1 C), temperature source Oral, resp. rate 16, height 5\' 7"  (1.702 m), weight 48.535 kg (107 lb), SpO2 98 %.  PHYSICAL EXAMINATION:  GENERAL:  55 y.o.-year-old patient lying in the bed with no acute distress.  EYES: Pupils equal, round, reactive to light and accommodation. No scleral icterus. Extraocular muscles intact.  HEENT: Head atraumatic, normocephalic. Oropharynx and nasopharynx clear.  NECK:  Supple, no jugular venous distention. No thyroid enlargement, no tenderness.  LUNGS: Normal breath sounds bilaterally, no wheezing, rales,rhonchi or crepitation. No use of accessory muscles of respiration.  CARDIOVASCULAR: S1, S2 normal. No murmurs, rubs, or gallops.   ABDOMEN: Soft, nontender, nondistended. Bowel sounds present. No organomegaly or mass.  EXTREMITIES:has hip pain with any leg motion on right side,'flexed position at the hips and knees  NEUROLOGIC: Cranial nerves II through XII are intact. Muscle strength 5/5 in all extremities. Sensation intact. Gait not checked.  PSYCHIATRIC: The patient is alert and oriented x 3.  SKIN: No obvious rash, lesion, or ulcer.    LABORATORY PANEL:   CBC  Recent Labs Lab 02/07/15 0319  WBC 7.6  HGB 14.8  HCT 44.0  PLT 259   ------------------------------------------------------------------------------------------------------------------  Chemistries   Recent Labs Lab 02/06/15 1201 02/07/15 0319  NA 139 141  K 3.9 3.9  CL 103 106  CO2 29 31  GLUCOSE 96 100*  BUN 9 10  CREATININE 0.55* 0.83  CALCIUM 8.8* 8.3*  AST 11*  --   ALT 12*  --   ALKPHOS 101  --   BILITOT 0.4  --    ------------------------------------------------------------------------------------------------------------------  Cardiac Enzymes  Recent Labs Lab 02/06/15 1201  TROPONINI <0.03   ------------------------------------------------------------------------------------------------------------------  RADIOLOGY:  Dg Tibia/fibula Right  02/06/2015  CLINICAL DATA:  Increased lethargy. Possible right lower leg injury. Initial encounter. EXAM: RIGHT TIBIA AND FIBULA - 2 VIEW COMPARISON:  None. FINDINGS: No acute bony or joint abnormality is seen. Talonavicular degenerative disease is identified. Musculature of the lower leg appears atrophic. IMPRESSION: No acute abnormality. Atrophic musculature right lower leg. Talonavicular degenerative change. Electronically Signed   By: Drusilla Kannerhomas  Dalessio M.D.   On: 02/06/2015 15:49   Ct Head Wo Contrast  02/06/2015  CLINICAL DATA:  Patient is alert and nonverbal. Seems more lethargic than usual. EXAM: CT HEAD WITHOUT CONTRAST  TECHNIQUE: Contiguous axial images were obtained from the  base of the skull through the vertex without intravenous contrast. COMPARISON:  CT brain 01/06/2015. FINDINGS: Limited exam due to motion artifact. Ventricles and sulci are stable in appearance with mega cisterna magna re- demonstrated. No evidence for acute cortically based infarct, intracranial hemorrhage, mass lesion or mass-effect. Chronic calcifications are re-demonstrated within the parietal lobes bilaterally. Orbits are unremarkable. Mucosal thickening involving the left greater than right maxillary sinuses. Mastoid air cells are unremarkable. Calvarium is intact. IMPRESSION: No acute intracranial process. Mega cisterna magna. Mild cortical volume loss. Electronically Signed   By: Annia Belt M.D.   On: 02/06/2015 14:59   Ct Abdomen Pelvis W Contrast  02/06/2015  CLINICAL DATA:  Decreased appetite.  Altered mental status. EXAM: CT ABDOMEN AND PELVIS WITH CONTRAST TECHNIQUE: Multidetector CT imaging of the abdomen and pelvis was performed using the standard protocol following bolus administration of intravenous contrast. CONTRAST:  OMNIPAQUE IOHEXOL 300 MG/ML  SOLN COMPARISON:  None. FINDINGS: Lower chest: Lung bases are clear. Heart size is normal. Tiny nonspecific pericardial effusion. Hepatobiliary: The liver parenchyma is normal. There is suggestion of faint stones in the gallbladder. Minimal prominence of the intrahepatic bile ducts. Is the patient's bilirubin normal? Pancreas: Normal. Spleen: Normal. Adrenals/Urinary Tract: There is a subtle 12 mm mass on the posterior aspect of the lower pole the left kidney worrisome for malignancy. 8 mm cyst on the upper pole of the left kidney. Adrenal glands are normal. Right kidney is normal. No hydronephrosis. Bladder is normal. Stomach/Bowel: The patient has a massive fecal impaction measuring approximately 10 cm in diameter with marked distention of the rectum. There is extensive stool throughout the entire colon. No small bowel dilatation.  Vascular/Lymphatic: Calcification in the distal abdominal aorta and iliac arteries. No adenopathy. Reproductive: Normal. Other: No free air or free fluid. 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 IMPRESSION: 1. Massive fecal impaction with extensive stool throughout the colon. 2. 12 mm mass on the lower pole of the left kidney worrisome for a small malignancy. 3. Probable cholelithiasis. 4. 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 Electronically Signed   By: Francene Boyers M.D.   On: 02/06/2015 15:05   US Renal  02/07/2015  CLINICAL DATA:  Left renal mass on CT. Subtle 12 mm mass appreciated along the posterior cortical margin of the lower left kidney on recent CT. EXAM: RENAL / URINARY TRACT ULTRASOUND COMPLETE COMPARISON:  CT dated 02/06/2015. FINDINGS: Right Kidney: Length: 10.5 cm. Echogenicity within normal limits. No mass, stone or hydronephrosis visualized. Left Kidney: Length: 11.5 cm. Subtle isoechoic-to-hyperechoic mass appreciated within the lower pole of the left kidney, measuring 2.3 x 2 cm, a possible correlate for the recent CT finding. Left kidney otherwise unremarkable. No renal stone or hydronephrosis. Bladder: Avascular material is present along the dependent/posterior wall of the bladder most suggestive of debris. Bladder otherwise unremarkable. IMPRESSION: 1. Subtle isoechoic-to-hyperechoic mass within the lower pole of the LEFT kidney, measuring 2.3 x 2 cm, not adequately visualized for definitive characterization, a possible correlate for the 1.2 cm mass seen on recent CT. Recommend more definitive characterization with either renal protocol CT (with and without contrast) or renal protocol MRI. 2. Right kidney appears normal. 3. Probable debris within the dependent aspects of the bladder. Bladder otherwise unremarkable. Electronically Signed   By: Bary Richard M.D.   On: 02/07/2015 09:37   Dg Chest Portable 1 View  02/06/2015  CLINICAL DATA:  Weakness. EXAM: PORTABLE CHEST 1 VIEW COMPARISON:  No prior available for comparison. FINDINGS: Patient is rotated. Mediastinum and hilar structures normal. Mild left base subsegmental atelectasis. Heart size normal. No pleural effusion or pneumothorax. IMPRESSION: Mild left base subsegmental atelectasis . Electronically Signed   By: Maisie Fus  Register   On: 02/06/2015 15:49   Dg Femur, Min 2 Views Right  02/06/2015  CLINICAL DATA:  Nonverbal patient, holding right leg in a flexed position. EXAM: RIGHT FEMUR 2 VIEWS COMPARISON:  None. FINDINGS: There is a displaced/ comminuted fracture of the right femoral neck, subcapital region with extension into the lateral portion of the right femoral head, and with at least mild impaction at the fracture site. Main component of the right femoral head remains well positioned relative to the acetabulum. Probable associated joint effusion. Mid and distal portions of the right femur appear intact and well aligned throughout. IMPRESSION: Displaced/comminuted fracture of the right femoral neck, centered within the subcapital region but with extension into lateral portion of the right femoral head. At least some degree of associated impaction at the fracture site. Main component of the femoral head remains well positioned relative to the acetabulum. Electronically Signed   By: Bary Richard M.D.   On: 02/06/2015 15:17    EKG:   Orders placed or performed during the hospital encounter of 02/06/15  . ED EKG  . ED EKG  . EKG 12-Lead  . EKG 12-Lead    ASSESSMENT AND PLAN:  1.Dietrick Barris is a 55 y.o. male with a known history of cerebral palsy, early onset alzheimer's dementia, depression presents from a group home secondary to weakness, decreased by mouth intake and also right knee pain and abdominal pain.  #1 Severe constipation- massive fecal impaction with extensive stool throughout the colon noted on CT. -continue stool softeners, - check KUB,ad fleet  enema  #2 Left renal mass-incidental 12 mm mass in the lower pole of left kidney.follwo renal sono showed Subtle isoechoic-to-hyperechoic mass within the lower pole of the LEFT kidney, measuring 2.3 x 2 cm, ;consult urology; 3. subacute/chronic right hip fracture femoral neck displaced;ortho following;once constipation resolves,likley surgery am,.appreciate ortho seeing pt,anterior total hip replacement .  #4 .early onset Alzheimer's dementia with depression-progressively worsening over the last several months. Getting less communicative now. Continue  Pyridostigmine., Namenda, Risperdal, Exelon patch.  5.nutrition;spoke to pts POA,Mr,[name],he told me pt prefers sweets and vegetables,  All the records are reviewed and case discussed with Care Management/Social Workerr. Management plans discussed with the patient, family and they are in agreement.  CODE STATUS: full  TOTAL TIME TAKING CARE OF THIS PATIENT: 35 minutes.   POSSIBLE D/C IN 1-2 DAYS, DEPENDING ON CLINICAL CONDITION.   Katha Hamming M.D on 02/08/2015 at 8:57 AM  Between 7am to 6pm - Pager - [phone]  After 6pm go to [email or web address] - password [email or web address] [facility]  [facility]  Office  [phone]  CC: Primary care physician; No primary care provider on file.   Note: This dictation was prepared with Dragon dictation along with smaller phrase technology. Any transcriptional errors that result from this process are unintentional.

## 2015-02-08 NOTE — Progress Notes (Signed)
Clinical Child psychotherapistocial Worker (CSW) faxed requested information to [facility]. PASARR is pending.   Jetta LoutBailey Morgan, LCSWA [phone]

## 2015-02-08 NOTE — Care Management Note (Addendum)
Case Management Note  Patient Details  Name: Loraine LericheBarry W Petre MRN: [id number] Date of Birth: 26-Oct-1960      Subjective/Objective:             Mr [name] is scheduled for a THR with Dr Rosita KeaMenz on 02/09/15.  Mr [name] has dementia and his Legal Guardians are Shanon RosserFred and Jeanette Beaudet ph: [phone] or [phone]. Anticipate rehab after surgery. [name]    Action/Plan:   Expected Discharge Date:                  Expected Discharge Plan:     In-House Referral:     Discharge planning Services     Post Acute Care Choice:    Choice offered to:     DME Arranged:    DME Agency:     HH Arranged:    HH Agency:     Status of Service:     Medicare Important Message Given:    Date Medicare IM Given:    Medicare IM give by:    Date Additional Medicare IM Given:    Additional Medicare Important Message give by:     If discussed at Long Length of Stay Meetings, dates discussed:    Additional Comments:  Charmon Thorson A, RN 02/08/2015, 4:02 PM

## 2015-02-09 ENCOUNTER — Inpatient Hospital Stay: Payer: Medicare Other | Admitting: Anesthesiology

## 2015-02-09 ENCOUNTER — Encounter
Admission: EM | Disposition: A | Discharge status: Skilled Nursing Facility | Payer: Self-pay | Source: Home / Self Care | Attending: Internal Medicine

## 2015-02-09 ENCOUNTER — Inpatient Hospital Stay: Payer: Medicare Other

## 2015-02-09 HISTORY — PX: TOTAL HIP ARTHROPLASTY: SHX124

## 2015-02-09 LAB — CBC
HEMATOCRIT: 37.9 % — AB (ref 40.0–52.0)
HEMOGLOBIN: 12.8 g/dL — AB (ref 13.0–18.0)
MCH: 30.3 pg (ref 26.0–34.0)
MCHC: 33.6 g/dL (ref 32.0–36.0)
MCV: 90.2 fL (ref 80.0–100.0)
Platelets: 170 10*3/uL (ref 150–440)
RBC: 4.21 MIL/uL — ABNORMAL LOW (ref 4.40–5.90)
RDW: 13.1 % (ref 11.5–14.5)
WBC: 8.2 10*3/uL (ref 3.8–10.6)

## 2015-02-09 LAB — CREATININE, SERUM: Creatinine, Ser: 0.55 mg/dL — ABNORMAL LOW (ref 0.61–1.24)

## 2015-02-09 SURGERY — ARTHROPLASTY, HIP, TOTAL, ANTERIOR APPROACH
Anesthesia: General | Site: Hip | Laterality: Right | Wound class: Clean

## 2015-02-09 MED ORDER — CEFAZOLIN SODIUM 1-5 GM-% IV SOLN
INTRAVENOUS | Status: DC | PRN
  Administered 2015-02-09: 1 g via INTRAVENOUS

## 2015-02-09 MED ORDER — PROPOFOL 10 MG/ML IV BOLUS
INTRAVENOUS | Status: DC | PRN
  Administered 2015-02-09: 140 mg via INTRAVENOUS

## 2015-02-09 MED ORDER — MORPHINE SULFATE (PF) 2 MG/ML IV SOLN
2.0000 mg | INTRAVENOUS | Status: DC | PRN
  Administered 2015-02-10: 2 mg via INTRAVENOUS
  Filled 2015-02-09: qty 1

## 2015-02-09 MED ORDER — ONDANSETRON HCL 4 MG/2ML IJ SOLN
INTRAMUSCULAR | Status: DC | PRN
  Administered 2015-02-09: 4 mg via INTRAVENOUS

## 2015-02-09 MED ORDER — TRANEXAMIC ACID 1000 MG/10ML IV SOLN
1000.0000 mg | INTRAVENOUS | Status: DC | PRN
  Administered 2015-02-09: 1000 mg via INTRAVENOUS

## 2015-02-09 MED ORDER — FENTANYL CITRATE (PF) 100 MCG/2ML IJ SOLN
INTRAMUSCULAR | Status: AC
  Filled 2015-02-09: qty 2

## 2015-02-09 MED ORDER — PHENYLEPHRINE HCL 10 MG/ML IJ SOLN
INTRAMUSCULAR | Status: DC | PRN
  Administered 2015-02-09: 50 ug via INTRAVENOUS
  Administered 2015-02-09: 100 ug via INTRAVENOUS
  Administered 2015-02-09 (×2): 50 ug via INTRAVENOUS

## 2015-02-09 MED ORDER — FENTANYL CITRATE (PF) 100 MCG/2ML IJ SOLN
25.0000 ug | INTRAMUSCULAR | Status: DC | PRN
  Administered 2015-02-09 (×4): 25 ug via INTRAVENOUS

## 2015-02-09 MED ORDER — DIPHENHYDRAMINE HCL 12.5 MG/5ML PO ELIX: 12.5000 mg | ORAL_SOLUTION | ORAL | Status: DC | PRN

## 2015-02-09 MED ORDER — ACETAMINOPHEN 500 MG PO TABS
1000.0000 mg | ORAL_TABLET | Freq: Four times a day (QID) | ORAL | Status: AC
  Administered 2015-02-09 – 2015-02-10 (×3): 1000 mg via ORAL
  Filled 2015-02-09 (×4): qty 2

## 2015-02-09 MED ORDER — ACETAMINOPHEN 10 MG/ML IV SOLN
INTRAVENOUS | Status: DC | PRN
  Administered 2015-02-09: 1000 mg via INTRAVENOUS

## 2015-02-09 MED ORDER — CEFAZOLIN SODIUM 1-5 GM-% IV SOLN
1.0000 g | Freq: Four times a day (QID) | INTRAVENOUS | Status: AC
  Administered 2015-02-10 (×2): 1 g via INTRAVENOUS
  Filled 2015-02-09 (×2): qty 50

## 2015-02-09 MED ORDER — LACTATED RINGERS IV SOLN
INTRAVENOUS | Status: DC | PRN
  Administered 2015-02-09 (×2): via INTRAVENOUS

## 2015-02-09 MED ORDER — NEOMYCIN-POLYMYXIN B GU 40-200000 IR SOLN
Status: DC | PRN
  Administered 2015-02-09: 4 mL

## 2015-02-09 MED ORDER — SODIUM CHLORIDE 0.9 % IV SOLN
INTRAVENOUS | Status: DC
  Administered 2015-02-09 – 2015-02-12 (×4): via INTRAVENOUS

## 2015-02-09 MED ORDER — PHENOL 1.4 % MT LIQD
1.0000 | OROMUCOSAL | Status: DC | PRN
  Filled 2015-02-09: qty 177

## 2015-02-09 MED ORDER — MENTHOL 3 MG MT LOZG
1.0000 | LOZENGE | OROMUCOSAL | Status: DC | PRN
  Filled 2015-02-09: qty 9

## 2015-02-09 MED ORDER — ALUM & MAG HYDROXIDE-SIMETH 200-200-20 MG/5ML PO SUSP: 30.0000 mL | ORAL | Status: DC | PRN

## 2015-02-09 MED ORDER — FENTANYL CITRATE (PF) 100 MCG/2ML IJ SOLN
INTRAMUSCULAR | Status: AC
  Administered 2015-02-09: 25 ug
  Filled 2015-02-09: qty 2

## 2015-02-09 MED ORDER — EPHEDRINE SULFATE 50 MG/ML IJ SOLN
INTRAMUSCULAR | Status: DC | PRN
  Administered 2015-02-09 (×2): 5 mg via INTRAVENOUS

## 2015-02-09 MED ORDER — BUPIVACAINE-EPINEPHRINE 0.25% -1:200000 IJ SOLN
INTRAMUSCULAR | Status: DC | PRN
  Administered 2015-02-09: 30 mL

## 2015-02-09 MED ORDER — GLYCOPYRROLATE 0.2 MG/ML IJ SOLN
INTRAMUSCULAR | Status: DC | PRN
  Administered 2015-02-09: 0.2 mg via INTRAVENOUS

## 2015-02-09 MED ORDER — ONDANSETRON HCL 4 MG/2ML IJ SOLN: 4.0000 mg | Freq: Once | INTRAMUSCULAR | Status: DC | PRN

## 2015-02-09 MED ORDER — OXYCODONE HCL 5 MG PO TABS
5.0000 mg | ORAL_TABLET | ORAL | Status: DC | PRN
  Administered 2015-02-09: 10 mg via ORAL
  Administered 2015-02-10 (×2): 5 mg via ORAL
  Filled 2015-02-09: qty 1
  Filled 2015-02-09: qty 2
  Filled 2015-02-09: qty 1

## 2015-02-09 MED ORDER — LIDOCAINE HCL (CARDIAC) 20 MG/ML IV SOLN
INTRAVENOUS | Status: DC | PRN
  Administered 2015-02-09: 60 mg via INTRAVENOUS

## 2015-02-09 MED ORDER — FENTANYL CITRATE (PF) 100 MCG/2ML IJ SOLN
INTRAMUSCULAR | Status: DC | PRN
  Administered 2015-02-09 (×2): 50 ug via INTRAVENOUS
  Administered 2015-02-09 (×2): 25 ug via INTRAVENOUS
  Administered 2015-02-09: 50 ug via INTRAVENOUS
  Administered 2015-02-09: 25 ug via INTRAVENOUS
  Administered 2015-02-09: 50 ug via INTRAVENOUS

## 2015-02-09 SURGICAL SUPPLY — 47 items
BLADE SAW 1/2 (BLADE) ×3 IMPLANT
BNDG COHESIVE 6X5 TAN STRL LF (GAUZE/BANDAGES/DRESSINGS) ×6 IMPLANT
CANISTER SUCT 1200ML W/VALVE (MISCELLANEOUS) ×3 IMPLANT
CAPT HIP TOTAL 3 ×3 IMPLANT
CATH FOL LEG HOLDER (MISCELLANEOUS) ×3 IMPLANT
CATH TRAY METER 16FR LF (MISCELLANEOUS) ×3 IMPLANT
CHLORAPREP W/TINT 26ML (MISCELLANEOUS) ×3 IMPLANT
DRAPE C-ARM XRAY 36X54 (DRAPES) ×3 IMPLANT
DRAPE C-SECTION (MISCELLANEOUS) ×3 IMPLANT
DRAPE INCISE IOBAN 66X60 STRL (DRAPES) IMPLANT
DRAPE POUCH INSTRU U-SHP 10X18 (DRAPES) ×3 IMPLANT
DRAPE SHEET LG 3/4 BI-LAMINATE (DRAPES) ×9 IMPLANT
DRAPE STERI IOBAN 125X83 (DRAPES) ×3 IMPLANT
DRAPE TABLE BACK 80X90 (DRAPES) ×3 IMPLANT
DRSG OPSITE POSTOP 4X8 (GAUZE/BANDAGES/DRESSINGS) ×6 IMPLANT
ELECT BLADE 6.5 EXT (BLADE) ×3 IMPLANT
GAUZE SPONGE 4X4 12PLY STRL (GAUZE/BANDAGES/DRESSINGS) IMPLANT
GLOVE BIOGEL PI IND STRL 9 (GLOVE) ×1 IMPLANT
GLOVE BIOGEL PI INDICATOR 9 (GLOVE) ×2
GLOVE SURG ORTHO 9.0 STRL STRW (GLOVE) ×3 IMPLANT
GOWN SPECIALTY ULTRA XL (MISCELLANEOUS) ×3 IMPLANT
GOWN STRL REUS W/ TWL LRG LVL3 (GOWN DISPOSABLE) ×1 IMPLANT
GOWN STRL REUS W/TWL LRG LVL3 (GOWN DISPOSABLE) ×2
HEMOVAC 400CC 10FR (MISCELLANEOUS) IMPLANT
HOOD PEEL AWAY FACE SHEILD DIS (HOOD) ×3 IMPLANT
MAT BLUE FLOOR 46X72 FLO (MISCELLANEOUS) ×3 IMPLANT
NDL SAFETY 18GX1.5 (NEEDLE) ×3 IMPLANT
NEEDLE SPNL 18GX3.5 QUINCKE PK (NEEDLE) ×3 IMPLANT
NS IRRIG 1000ML POUR BTL (IV SOLUTION) ×3 IMPLANT
PACK HIP COMPR (MISCELLANEOUS) ×3 IMPLANT
SLEEVE CABLE 2MM VT (Orthopedic Implant) ×3 IMPLANT
SOL PREP PVP 2OZ (MISCELLANEOUS) ×3
SOLUTION PREP PVP 2OZ (MISCELLANEOUS) ×1 IMPLANT
STAPLER SKIN PROX 35W (STAPLE) ×3 IMPLANT
STRAP SAFETY BODY (MISCELLANEOUS) ×3 IMPLANT
SUT DVC 2 QUILL PDO  T11 36X36 (SUTURE) ×2
SUT DVC 2 QUILL PDO T11 36X36 (SUTURE) ×1 IMPLANT
SUT DVC QUILL MONODERM 30X30 (SUTURE) ×3 IMPLANT
SUT ETHIBOND NAB CT1 #1 30IN (SUTURE) ×3 IMPLANT
SUT SILK 0 (SUTURE) ×2
SUT SILK 0 30XBRD TIE 6 (SUTURE) ×1 IMPLANT
SUT VIC AB 1 CT1 36 (SUTURE) ×3 IMPLANT
SYR 20CC LL (SYRINGE) ×3 IMPLANT
SYR 30ML LL (SYRINGE) ×3 IMPLANT
TAPE MICROFOAM 4IN (TAPE) IMPLANT
TUBE KAMVAC SUCTION (TUBING) ×3 IMPLANT
WATER STERILE IRR 1000ML POUR (IV SOLUTION) ×3 IMPLANT

## 2015-02-09 NOTE — Transfer of Care (Signed)
Immediate Anesthesia Transfer of Care Note  Patient: Maurice Clayton  Procedure(s) Performed: Procedure(s): TOTAL HIP ARTHROPLASTY ANTERIOR APPROACH (Right)  Patient Location: PACU  Anesthesia Type:General  Level of Consciousness: awake, alert  and confused  Airway & Oxygen Therapy: Patient Spontanous Breathing and Patient connected to face mask oxygen  Post-op Assessment: Report given to RN and Post -op Vital signs reviewed and stable  Post vital signs: Reviewed and stable  Last Vitals:  Filed Vitals:   02/09/15 0734 02/09/15 1531  BP: 107/59 135/77  Pulse: 84 94  Temp: 37.4 C 37.2 C  Resp: 20 16    Complications: No apparent anesthesia complications

## 2015-02-09 NOTE — Plan of Care (Signed)
CHG X2 completed on pt for OR. MRSA swab Neg.  Report given to OR and SCD in place.  Pharm called and asked to sent send anbx for OR (early) - per OR request.

## 2015-02-09 NOTE — Progress Notes (Signed)
[facility] [place] [facility]   PATIENT NAME: Maurice Clayton    MR#:  [id number]  DATE OF BIRTH:  November 26, 1960  SUBJECTIVE :c/p hip pain.has constipation,and found to have left renal mass. Poor historian . her OR today for hip fracture repair. Constipation improved. Had a good bowel movement yesterday.   CHIEF COMPLAINT:   Chief Complaint  Patient presents with  . Weakness    REVIEW OF SYSTEMS:   Review of Systems  Unable to perform ROS: dementia   CONSTITUTIONAL: No fever, fatigue or weakness.  EYES: No blurred or double vision.  EARS, NOSE, AND THROAT: No tinnitus or ear pain.  RESPIRATORY: No cough, shortness of breath, wheezing or hemoptysis.  CARDIOVASCULAR: No chest pain, orthopnea, edema.  GASTROINTESTINAL: No nausea, vomiting, diarrhea or abdominal pain. Has constipation. GENITOURINARY: No dysuria, hematuria.  ENDOCRINE: No polyuria, nocturia,  HEMATOLOGY: No anemia, easy bruising or bleeding SKIN: No rash or lesion. MUSCULOSKELETAL: c/o right hip pain,  NEUROLOGIC: No tingling, numbness, weakness.  PSYCHIATRY: No anxiety or depression.   DRUG ALLERGIES:  No Known Allergies  VITALS:  Blood pressure 107/59, pulse 84, temperature 99.4 F (37.4 C), temperature source Oral, resp. rate 20, height 5\' 7"  (1.702 m), weight 48.535 kg (107 lb), SpO2 97 %.  PHYSICAL EXAMINATION:  GENERAL:  55 y.o.-year-old patient lying in the bed with no acute distress.  EYES: Pupils equal, round, reactive to light and accommodation. No scleral icterus. Extraocular muscles intact.  HEENT: Head atraumatic, normocephalic. Oropharynx and nasopharynx clear.  NECK:  Supple, no jugular venous distention. No thyroid enlargement, no tenderness.  LUNGS: Normal breath sounds bilaterally, no wheezing, rales,rhonchi or crepitation. No use of accessory muscles of respiration.  CARDIOVASCULAR: S1, S2 normal. No murmurs, rubs, or gallops.  ABDOMEN: Soft, nontender,  nondistended. Bowel sounds present. No organomegaly or mass.  EXTREMITIES:has hip pain with any leg motion on right side,'flexed position at the hips and knees  NEUROLOGIC: Cranial nerves II through XII are intact. Muscle strength 5/5 in all extremities. Sensation intact. Gait not checked.  PSYCHIATRIC: Has some dementia. SKIN: No obvious rash, lesion, or ulcer.    LABORATORY PANEL:   CBC  Recent Labs Lab 02/07/15 0319  WBC 7.6  HGB 14.8  HCT 44.0  PLT 259   ------------------------------------------------------------------------------------------------------------------  Chemistries   Recent Labs Lab 02/06/15 1201 02/07/15 0319  NA 139 141  K 3.9 3.9  CL 103 106  CO2 29 31  GLUCOSE 96 100*  BUN 9 10  CREATININE 0.55* 0.83  CALCIUM 8.8* 8.3*  AST 11*  --   ALT 12*  --   ALKPHOS 101  --   BILITOT 0.4  --    ------------------------------------------------------------------------------------------------------------------  Cardiac Enzymes  Recent Labs Lab 02/06/15 1201  TROPONINI <0.03   ------------------------------------------------------------------------------------------------------------------  RADIOLOGY:  Dg Abd 1 View  02/08/2015  CLINICAL DATA:  Constipation EXAM: ABDOMEN - 1 VIEW COMPARISON:  None. FINDINGS: There is a large stool ball in the rectal vault. Fairly large amount of stool is also seen throughout the colon. No dilated large or small bowel loops seen. Detailed characterization is otherwise limited due to patient motion artifact. No obvious evidence of free intraperitoneal air, soft tissue mass or abnormal fluid collection. There is an old right femoral neck fracture, as also described on the recent CT of 02/06/2015. IMPRESSION: Findings are compatible with the given history of constipation. Large stool ball distending the rectal vault, measuring at least 11 x 9 cm. Large amount of  stool throughout the colon. Electronically Signed   By: Bary Richard M.D.   On: 02/08/2015 10:36    EKG:   Orders placed or performed during the hospital encounter of 02/06/15  . ED EKG  . ED EKG  . EKG 12-Lead  . EKG 12-Lead    ASSESSMENT AND PLAN:  1.Maurice Clayton is a 55 y.o. male with a known history of cerebral palsy, early onset alzheimer's dementia, depression presents from a group home secondary to weakness, decreased by mouth intake and also right knee pain and abdominal pain.  #1 Severe constipation- massive fecal impaction with extensive stool throughout the colon noted on CT. improved with stool softeners. -continue stool softeners,   #2 Left renal mass-incidental 12 mm mass in the lower pole of left kidney.follwo renal sono showed Subtle isoechoic-to-hyperechoic mass within the lower pole of the LEFT kidney, measuring 2.3 x 2 cm, ; seen by urology, family chose to have a surveillance, we will follow up with urology as an outpatient in 2 weeks. 3. subacute/chronic right hip fracture femoral neck displaced;ortho following; taken for surgery today. #4 .early onset Alzheimer's dementia with depression-progressively worsening over the last several months. Getting less communicative now. Continue  Pyridostigmine., Namenda, Risperdal, Exelon patch.  5.nutrition;spoke to pts POA,Maurice Clayton,he told me pt prefers sweets and vegetables,  All the records are reviewed and case discussed with Care Management/Social Workerr. Management plans discussed with the patient, family and they are in agreement.  CODE STATUS: full  TOTAL TIME TAKING CARE OF THIS PATIENT: 35 minutes.   POSSIBLE D/C IN 1-2 DAYS, DEPENDING ON CLINICAL CONDITION.   Katha Hamming M.D on 02/09/2015 at 2:33 PM  Between 7am to 6pm - Pager - [phone]  After 6pm go to [email or web address] - password [email or web address] [facility]  [facility] [facility]  Office  [phone]  CC: Primary care physician; No primary care provider on file.   Note: This dictation was prepared with Dragon  dictation along with smaller phrase technology. Any transcriptional errors that result from this process are unintentional.

## 2015-02-09 NOTE — Plan of Care (Signed)
Family changed password. Asking NOT to discuss care with anyone without password including Group Home/Staff.

## 2015-02-09 NOTE — Plan of Care (Signed)
Morning meds not given per confirmation of DrMenz since going to OR.  Pain meds last given 0930 with sip of water.

## 2015-02-09 NOTE — Progress Notes (Signed)
Clinical Child psychotherapistocial Worker (CSW) received call from [place] [facility] [place]. Per Tammy she will try to come assess patient tomorrow for SNF placement.   Jetta LoutBailey Morgan, LCSWA [phone]

## 2015-02-09 NOTE — Clinical Social Work Note (Signed)
Patient's Pasrr has gone for Level 2 evaluation. Pasrr pending. Patient awaiting bed offers. York SpanielMonica Elery Cadenhead MSW,LCSW [phone]

## 2015-02-09 NOTE — Op Note (Signed)
02/06/2015 - 02/09/2015  6:22 PM  PATIENT:  Maurice Clayton  55 y.o. male  PRE-OPERATIVE DIAGNOSIS:  chronic hip fracture and arthritis right  POST-OPERATIVE DIAGNOSIS:  chronic hip fracture and arthritis right  PROCEDURE:  Procedure(s): TOTAL HIP ARTHROPLASTY ANTERIOR APPROACH (Right)  SURGEON: Leitha SchullerMichael J Haris Baack, MD  ASSISTANTS: None  ANESTHESIA:   general  EBL:  Total I/O In: -  Out: 825 [Urine:225; Blood:600]  BLOOD ADMINISTERED:none  DRAINS: none   LOCAL MEDICATIONS USED:  BUPIVICAINE   SPECIMEN:  Source of Specimen:  Femoral head  DISPOSITION OF SPECIMEN:  PATHOLOGY  COUNTS:  YES  TOURNIQUET:  * No tourniquets in log *  IMPLANTS: Medacta AMIS 2 standard stem, 50 mm Mpact cup DM with liner and S 28 mm head  DICTATION: .Dragon Dictation   The patient was brought to the operating room and after general anesthesia was obtained patient was placed on the operative table with the ipsilateral foot into the Medacta attachment, contralateral leg on a well-padded table. C-arm was brought in and preop template x-ray taken. After prepping and draping in usual sterile fashion appropriate patient identification and timeout procedures were completed. Anterior approach to the hip was obtained and centered over the greater trochanter and TFL muscle. The subcutaneous tissue was incised hemostasis being achieved by electrocautery. TFL fascia was incised and the muscle retracted laterally deep retractor placed. The lateral femoral circumflex vessels were identified and ligated. The anterior capsule was exposed and a capsulotomy performed. The neck was identified, with the chronic neck fracture noted just below the head.. and a femoral neck cut carried out with a saw. The head was removed without difficulty and showed sclerotic femoral head and acetabulum. Reaming was carried out to 50 mm and a 50 mm cup trial gave appropriate tightness to the acetabular component a 50 Mpact DM cup was impacted into  position and some anteversion because of patient's spasticity. The leg was then externally rotated and ischiofemoral and pubofemoral releases carried out. The femur was sequentially broached to a size 2, size 2 stem standard neck and S head trials were placed and the final components chosen. The 2 standard stem was inserted along with a S 28 mm head and 50 mm liner. The hip was reduced and was stable the wound was thoroughly irrigated and infiltrated with TXA. The deep fascia view. Using a heavy Quill after infiltration of 30 cc of quarter percent Sensorcaine with epinephrine. Skin closed with 2-0 Quill and staples. Honeycomb dressing applied  PLAN OF CARE: Continue as inpatient

## 2015-02-09 NOTE — Anesthesia Preprocedure Evaluation (Signed)
Anesthesia Evaluation  Patient identified by MRN, date of birth, ID band Patient confused    Reviewed: Allergy & Precautions, NPO status , Patient's Chart, lab work & pertinent test results  Airway Mallampati: II       Dental  (+) Edentulous Upper, Edentulous Lower   Pulmonary neg pulmonary ROS,    breath sounds clear to auscultation       Cardiovascular hypertension, Pt. on medications  Rhythm:Regular Rate:Normal     Neuro/Psych PSYCHIATRIC DISORDERS negative neurological ROS     GI/Hepatic negative GI ROS, Neg liver ROS,   Endo/Other  Hyperthyroidism   Renal/GU negative Renal ROS     Musculoskeletal   Abdominal Normal abdominal exam  (+)   Peds  Hematology   Anesthesia Other Findings   Reproductive/Obstetrics                             Anesthesia Physical Anesthesia Plan  ASA: III  Anesthesia Plan: General   Post-op Pain Management:    Induction: Intravenous  Airway Management Planned: LMA  Additional Equipment:   Intra-op Plan:   Post-operative Plan: Extubation in OR  Informed Consent: I have reviewed the patients History and Physical, chart, labs and discussed the procedure including the risks, benefits and alternatives for the proposed anesthesia with the patient or authorized representative who has indicated his/her understanding and acceptance.     Plan Discussed with: CRNA  Anesthesia Plan Comments:         Anesthesia Quick Evaluation

## 2015-02-10 ENCOUNTER — Encounter: Payer: Self-pay | Admitting: Orthopedic Surgery

## 2015-02-10 LAB — BASIC METABOLIC PANEL
ANION GAP: 8 (ref 5–15)
BUN: 10 mg/dL (ref 6–20)
CHLORIDE: 102 mmol/L (ref 101–111)
CO2: 25 mmol/L (ref 22–32)
CREATININE: 0.45 mg/dL — AB (ref 0.61–1.24)
Calcium: 8 mg/dL — ABNORMAL LOW (ref 8.9–10.3)
GFR calc non Af Amer: >60 mL/min (ref >60)
Glucose, Bld: 89 mg/dL (ref 65–99)
POTASSIUM: 3.6 mmol/L (ref 3.5–5.1)
SODIUM: 135 mmol/L (ref 135–145)

## 2015-02-10 LAB — HEMOGLOBIN: HEMOGLOBIN: 11.8 g/dL — AB (ref 13.0–18.0)

## 2015-02-10 MED ORDER — MAGNESIUM CITRATE PO SOLN
1.0000 | Freq: Once | ORAL | Status: DC
  Filled 2015-02-10: qty 296

## 2015-02-10 MED ORDER — LORAZEPAM 2 MG/ML IJ SOLN
1.0000 mg | Freq: Once | INTRAMUSCULAR | Status: AC
  Administered 2015-02-10: 1 mg via INTRAVENOUS
  Filled 2015-02-10: qty 1

## 2015-02-10 NOTE — Anesthesia Postprocedure Evaluation (Signed)
Anesthesia Post Note  Patient: Maurice Clayton  Procedure(s) Performed: Procedure(s) (LRB): TOTAL HIP ARTHROPLASTY ANTERIOR APPROACH (Right)  Patient location during evaluation: PACU Anesthesia Type: General Level of consciousness: awake Pain management: pain level controlled Vital Signs Assessment: post-procedure vital signs reviewed and stable Respiratory status: spontaneous breathing Cardiovascular status: stable Anesthetic complications: no    Last Vitals:  Filed Vitals:   02/10/15 0020 02/10/15 0339  BP: 96/65 103/78  Pulse: 93 105  Temp: 36.8 C 37.4 C  Resp: 18 18    Last Pain:  Filed Vitals:   02/10/15 0339  PainSc: Asleep                 VAN STAVEREN,Garlene Apperson

## 2015-02-10 NOTE — Progress Notes (Signed)
  Subjective: 1 Day Post-Op Procedure(s) (LRB): TOTAL HIP ARTHROPLASTY ANTERIOR APPROACH (Right) Patient reports pain as mild.   Patient is well, and has had no acute complaints or problems Plan is to go Skilled nursing facility after hospital stay. Negative for chest pain and shortness of breath Fever: no Gastrointestinal:Negative for nausea and vomiting  Objective: Vital signs in last 24 hours: Temp:  [97 F (36.1 C)-99.4 F (37.4 C)] 98.7 F (37.1 C) (01/10 0731) Pulse Rate:  [36-120] 110 (01/10 0731) Resp:  [10-18] 16 (01/10 0731) BP: (92-145)/(53-103) 123/84 mmHg (01/10 0731) SpO2:  [94 %-100 %] 97 % (01/10 0731)  Intake/Output from previous day:  Intake/Output Summary (Last 24 hours) at 02/10/15 0734 Last data filed at 02/10/15 0522  Gross per 24 hour  Intake   2300 ml  Output   1425 ml  Net    875 ml    Intake/Output this shift:    Labs:  Recent Labs  02/09/15 2111 02/10/15 0412  HGB 12.8* 11.8*    Recent Labs  02/09/15 2111  WBC 8.2  RBC 4.21*  HCT 37.9*  PLT 170    Recent Labs  02/09/15 2111 02/10/15 0412  NA  --  135  K  --  3.6  CL  --  102  CO2  --  25  BUN  --  10  CREATININE 0.55* 0.45*  GLUCOSE  --  89  CALCIUM  --  8.0*   No results for input(s): LABPT, INR in the last 72 hours.   EXAM General - Patient is alert and able to answer simple questions, but not able to communicate properly. Extremity - ABD soft Sensation intact distally Intact pulses distally Dorsiflexion/Plantar flexion intact Incision: dressing C/D/I Dressing/Incision - clean, dry, no drainage Motor Function - intact, moving foot and toes well on exam.  Past Medical History  Diagnosis Date  . Cerebral palsy (HCC)     bed bound status  . Dementia   . HTN (hypertension)   . Depression   . Vitamin B 12 deficiency   . Hyperthyroidism     Assessment/Plan: 1 Day Post-Op Procedure(s) (LRB): TOTAL HIP ARTHROPLASTY ANTERIOR APPROACH (Right) Active  Problems:   Abdominal pain   Pressure ulcer  Estimated body mass index is 16.75 kg/(m^2) as calculated from the following:   Height as of this encounter: 5\' 7"  (1.702 m).   Weight as of this encounter: 48.535 kg (107 lb). Advance diet Up with therapy D/C IV fluids when tolerating po intake.  Abdomen soft this AM.  Pt had bowel disimpaction two days ago. Pt labs reviewed. Continue PT.  DVT Prophylaxis - Lovenox, Foot Pumps and TED hose Weight-Bearing as tolerated to right leg  J. Horris LatinoLance Alyssah Algeo, PA-C [facility] 02/10/2015, 7:34 AM

## 2015-02-10 NOTE — Evaluation (Signed)
Physical Therapy Evaluation Patient Details Name: Maurice Clayton MRN: [id number] DOB: 1960/06/25 Today's Date: 02/10/2015   History of Present Illness  Pt is a 55 y.o. male with PMH of CP and early onset alzheimer's dementia presenting to hospital with weakness, decreased intake by mouth, R knee and abdominal pain.  Pt admitted with severe constipation, subtle 12 mm L kidney mass, and old R hip fx (pt had fall from w/c about 3 months ago and pelvic x-rays were negative at that time per notes).  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  Of note, imaging 01/13/15 shows acute mildly displaced fx's involving distal phalanx in R 5th digit.  Clinical Impression  Prior to admission, per notes pt was walking about 10 months ago (when he first came to care home) but has had decreased mobility last 3-4 months and appears to have been more bed bound most recently. Currently pt is min assist supine to sit; max assist x2 to stand with RW (pt kept R knee flexed and foot off ground in standing); and max assist x2 slide board transfer bed to recliner.  Pt would benefit from skilled PT to address noted impairments and functional limitations.  Recommend pt discharge to STR when medically appropriate.     Follow Up Recommendations SNF    Equipment Recommendations   (TBD)    Recommendations for Other Services       Precautions / Restrictions Precautions Precautions: Fall;Anterior Hip Restrictions Weight Bearing Restrictions: Yes RLE Weight Bearing: Weight bearing as tolerated      Mobility  Bed Mobility Overal bed mobility: Needs Assistance Bed Mobility: Supine to Sit;Rolling Rolling: Supervision   Supine to sit: Min assist     General bed mobility comments: pt required extra time and cueing supine to sit but able to perform fairly well with minimal assist for trunk  Transfers Overall transfer level: Needs  assistance Equipment used: Rolling walker (2 wheeled) Transfers: Sit to/from Stand;Lateral/Scoot Transfers Sit to Stand: Max assist;+2 physical assistance (pt stood but did not place R foot on ground)        Lateral/Scoot Transfers: Max assist;+2 physical assistance;With slide board General transfer comment: pt required vc's for hand and feet placement  Ambulation/Gait             General Gait Details: not appropriate at this time  Stairs            Wheelchair Mobility    Modified Rankin (Stroke Patients Only)       Balance Overall balance assessment: Needs assistance Sitting-balance support: Bilateral upper extremity supported;Feet supported Sitting balance-Leahy Scale: Poor Sitting balance - Comments: intermittent posterior lean requiring assist to maintain upright   Standing balance support: Bilateral upper extremity supported (on RW) Standing balance-Leahy Scale: Zero                               Pertinent Vitals/Pain Pain Assessment: Faces Pain Score: 4  Pain Location: R hip/knee with movement Pain Descriptors / Indicators: Guarding Pain Intervention(s): Limited activity within patient's tolerance;Monitored during session;Premedicated before session;Repositioned  Vitals stable and WFL throughout treatment session.    Home Living Family/patient expects to be discharged to:: Other (Comment) (Care home for about 10 months)  Prior Function Level of Independence: Needs assistance         Comments: Per notes, pt was ambulatory with RW initially when he went to care home but has had decreased mobility last 3-4 months     Hand Dominance        Extremity/Trunk Assessment   Upper Extremity Assessment: Defer to OT evaluation           Lower Extremity Assessment: RLE deficits/detail;LLE deficits/detail;Difficult to assess due to impaired cognition RLE Deficits / Details: R knee extension 5 degrees short  of neutral; R knee flexion WFL; R DF to neutral; pt did not follow commands to clearly perform MMT LLE Deficits / Details: L knee extension 0 degrees; L knee flexion WFL; L DF to neutral; pt did not follow commands to clearly perform MMT  Cervical / Trunk Assessment: Normal  Communication   Communication: Other (comment) (Speaks one word answers inconsistently)  Cognition Arousal/Alertness: Awake/alert Behavior During Therapy: Impulsive;Restless Overall Cognitive Status:  (Pt oriented to name but not DOB, place, or time base on limited answers)       Memory: Decreased recall of precautions              General Comments General comments (skin integrity, edema, etc.): R hip dressing in place with minimal drainage  Nursing cleared pt for participation in physical therapy.  Pt appeared agreeable to PT session.    Exercises   Performed semi-supine B LE therapeutic exercise x 10 reps:  Ankle pumps (AAROM B LE's); SAQ's (AAROM R; AAROM L); heelslides (AAROM R; AAROM L), hip abd/adduction (AAROM R; AAROM L).  Pt required vc's and tactile cues for correct technique with exercises (pt intermittently participating on R; pt participating 80% of the time on L LE).       Assessment/Plan    PT Assessment Patient needs continued PT services  PT Diagnosis Difficulty walking;Generalized weakness;Acute pain   PT Problem List Decreased strength;Decreased range of motion;Decreased activity tolerance;Decreased balance;Decreased mobility;Decreased cognition;Decreased knowledge of use of DME;Decreased safety awareness;Decreased knowledge of precautions;Pain  PT Treatment Interventions DME instruction;Gait training;Functional mobility training;Therapeutic activities;Therapeutic exercise;Balance training;Patient/family education;Wheelchair mobility training   PT Goals (Current goals can be found in the Care Plan section) Acute Rehab PT Goals Patient Stated Goal: Pt did not verbalize any goals when  asked. PT Goal Formulation: Patient unable to participate in goal setting    Frequency BID   Barriers to discharge Decreased caregiver support      Co-evaluation               End of Session Equipment Utilized During Treatment: Gait belt Activity Tolerance: Patient limited by pain Patient left: in chair;with call bell/phone within reach;with chair alarm set;with SCD's reapplied Nurse Communication: Mobility status;Precautions         Time: [id number] PT Time Calculation (min) (ACUTE ONLY): 30 min   Charges:   PT Evaluation $PT Eval Moderate Complexity: 1 Procedure PT Treatments $Therapeutic Exercise: 8-22 mins   PT G CodesHendricks Limes:        Yaphet Smethurst 02/10/2015, 10:46 AM Hendricks LimesEmily Nadene Witherspoon, PT [phone]

## 2015-02-10 NOTE — Progress Notes (Signed)
PT Cancellation Note  Patient Details Name: Loraine LericheBarry W Barrett MRN: [id number] DOB: [birth year]   Cancelled Treatment:    Reason Eval/Treat Not Completed: Fatigue/lethargy limiting ability to participate.  PT spoke with nursing earlier in afternoon (per OT note pt was given med to calm down after PT session and unable to participate in OT session d/t level of alertness) and nursing requesting PT try pt later in afternoon.  PT attempted to see pt a little after 4:30pm but pt briefly woke up and fell back asleep and not able to participate in PT.  Will re-attempt to see pt tomorrow.   Irving Burtonmily Miami Latulippe 02/10/2015, 4:40 PM Hendricks LimesEmily Narjis Mira, PT [phone]

## 2015-02-10 NOTE — Evaluation (Signed)
Occupational Therapy Evaluation Patient Details Name: Maurice Clayton MRN: [id number] DOB: Jun 02, 1960 Today's Date: 02/10/2015    History of Present Illness Pt is a 55 y.o. male with PMH of CP and early onset alzheimer's dementia presenting to hospital with weakness, decreased intake by mouth, R knee and abdominal pain.  Pt admitted with severe constipation, subtle 12 mm L kidney mass, and old R hip fx (pt had fall from w/c about 3 months ago and pelvic x-rays were negative at that time per notes).  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  Of note, imaging 01/13/15 shows acute mildly displaced fx's involving distal phalanx in R 5th digit.   Clinical Impression   Received order for OT evaluation and chart reviewed.  Spoke to NSG and she indicated he was very upset and needed medication to calm down after getting up in chair with PT earlier today.  He was sleepy on OTs arrival and did not arouse well enough to participate in OT evaluation.  Will attempt evaluation again tomorrow when not sedated from medication.       Follow Up Recommendations       Equipment Recommendations       Recommendations for Other Services       Precautions / Restrictions        Mobility Bed Mobility                  Transfers                      Balance                              ADL                                               Vision     Perception     Praxis      Pertinent Vitals/Pain       Hand Dominance     Extremity/Trunk Assessment             Communication     Cognition                           General Comments       Exercises       Shoulder Instructions      Home Living                                          Prior Functioning/Environment               OT Diagnosis:     OT Problem List:      OT Treatment/Interventions:      OT Goals(Current goals can be found in the care plan section) Acute Rehab OT Goals Patient Stated Goal: Pt did not verbalize any goals when asked.  OT Frequency:     Barriers to D/C:            Co-evaluation  End of Session    Activity Tolerance:   Patient left:     Time:  -    Charges:    G-Codes:    Wofford,Susan 02/10/2015, 2:40 PM  Susanne BordersSusan Wofford, OTR/L ascom [phone]

## 2015-02-10 NOTE — Care Management (Signed)
RNCM consult received and will continue to follow. [facility] CSW. PT pending.

## 2015-02-10 NOTE — Progress Notes (Signed)
Clinical Child psychotherapistocial Worker (CSW) faxed PT evaluation to [facility].   Jetta LoutBailey Morgan, LCSWA [phone]

## 2015-02-10 NOTE — Progress Notes (Signed)
[facility] - [place] at [facility]   PATIENT NAME: Maurice RutterBarry Clayton    Maurice Clayton#:  [id number]  DATE OF BIRTH:  06/03/1960  SUBJECTIVE admitted for weakness, decreased ambulatory status improved to have right hip fracture. Also patient had constipation, right renal mass. Poor historian .S /ppost right hip arthroplasty  CHIEF COMPLAINT:   Chief Complaint  Patient presents with  . Weakness    REVIEW OF SYSTEMS:   Review of Systems  Unable to perform ROS: dementia   CONSTITUTIONAL: No fever, fatigue or weakness.  EYES: No blurred or double vision.  EARS, NOSE, AND THROAT: No tinnitus or ear pain.  RESPIRATORY: No cough, shortness of breath, wheezing or hemoptysis.  CARDIOVASCULAR: No chest pain, orthopnea, edema.  GASTROINTESTINAL: No nausea, vomiting, diarrhea or abdominal pain. Has constipation. GENITOURINARY: No dysuria, hematuria.  ENDOCRINE: No polyuria, nocturia,  HEMATOLOGY: No anemia, easy bruising or bleeding SKIN: No rash or lesion. MUSCULOSKELETAL: c/o right hip pain,  NEUROLOGIC: No tingling, numbness, weakness.  PSYCHIATRY: No anxiety or depression.   DRUG ALLERGIES:  No Known Allergies  VITALS:  Blood pressure 123/84, pulse 110, temperature 98.7 F (37.1 C), temperature source Oral, resp. rate 16, height 5\' 7"  (1.702 m), weight 48.535 kg (107 lb), SpO2 97 %.  PHYSICAL EXAMINATION:  GENERAL:  55 y.o.-year-old patient lying in the bed with no acute distress.  EYES: Pupils equal, round, reactive to light and accommodation. No scleral icterus. Extraocular muscles intact.  HEENT: Head atraumatic, normocephalic. Oropharynx and nasopharynx clear.  NECK:  Supple, no jugular venous distention. No thyroid enlargement, no tenderness.  LUNGS: Normal breath sounds bilaterally, no wheezing, rales,rhonchi or crepitation. No use of accessory muscles of respiration.  CARDIOVASCULAR: S1, S2 normal. No murmurs, rubs, or gallops.  ABDOMEN: Soft, nontender,  nondistended. Bowel sounds present. No organomegaly or mass.  EXTREMITIES:has hip pain with any leg motion on right side,'flexed position at the hips and knees  NEUROLOGIC: Cranial nerves II through XII are intact. Muscle strength 5/5 in all extremities. Sensation intact. Gait not checked.  PSYCHIATRIC: Has some dementia. SKIN: No obvious rash, lesion, or ulcer.    LABORATORY PANEL:   CBC  Recent Labs Lab 02/09/15 2111 02/10/15 0412  WBC 8.2  --   HGB 12.8* 11.8*  HCT 37.9*  --   PLT 170  --    ------------------------------------------------------------------------------------------------------------------  Chemistries   Recent Labs Lab 02/06/15 1201  02/10/15 0412  NA 139  < > 135  K 3.9  < > 3.6  CL 103  < > 102  CO2 29  < > 25  GLUCOSE 96  < > 89  BUN 9  < > 10  CREATININE 0.55*  < > 0.45*  CALCIUM 8.8*  < > 8.0*  AST 11*  --   --   ALT 12*  --   --   ALKPHOS 101  --   --   BILITOT 0.4  --   --   < > = values in this interval not displayed. ------------------------------------------------------------------------------------------------------------------  Cardiac Enzymes  Recent Labs Lab 02/06/15 1201  TROPONINI <0.03   ------------------------------------------------------------------------------------------------------------------  RADIOLOGY:  Dg Hip Operative Unilat With Pelvis Right  02/09/2015  CLINICAL DATA:  55 year old male status post right hip replacement. EXAM: OPERATIVE RIGHT HIP (WITH PELVIS IF PERFORMED) 3 VIEWS TECHNIQUE: Fluoroscopic spot image(s) were submitted for interpretation post-operatively. COMPARISON:  Right femur radiograph 02/06/2015. FINDINGS: 3 intraoperative views of the right hip demonstrate performance of right hip bipolar hemiarthroplasty. No definite periprosthetic  fracture identified on this limited examination. IMPRESSION: 1. Intraoperative documentation of right hip bipolar hemiarthroplasty. Electronically Signed   By:  Trudie Reed M.D.   On: 02/09/2015 18:40   Dg Hip Unilat With Pelvis 2-3 Views Right  02/09/2015  CLINICAL DATA:  56 year old male status post right hip arthroplasty. EXAM: DG HIP (WITH OR WITHOUT PELVIS) 2-3V RIGHT COMPARISON:  No priors. FINDINGS: Two views of the right hip demonstrate postoperative changes of bipolar hemiarthroplasty in the right hip. No periprosthetic fracture or other immediate complicating features. Small amount of gas in the hip joint and overlying soft tissues, with lateral skin staples in place. Femoral head is properly located. IMPRESSION: 1. Postoperative changes of right hip bipolar hemiarthroplasty without acute complicating features, as above. Electronically Signed   By: Trudie Reed M.D.   On: 02/09/2015 19:18    EKG:   Orders placed or performed during the hospital encounter of 02/06/15  . ED EKG  . ED EKG  . EKG 12-Lead  . EKG 12-Lead    ASSESSMENT AND PLAN:  1.Maurice Clayton is a 55 y.o. male with a known history of cerebral palsy, early onset alzheimer's dementia, depression presents from a group home secondary to weakness, decreased by mouth intake and also right knee pain and abdominal pain.  #1 Severe constipation- massive fecal impaction with extensive stool throughout the colon noted on CT. improved with stool softeners. -continue stool softeners,   #2 Left renal mass-incidental 12 mm mass in the lower pole of left kidney.follwo renal sono showed Subtle isoechoic-to-hyperechoic mass within the lower pole of the LEFT kidney, measuring 2.3 x 2 cm, ; seen by urology, family chose to have a surveillance, we will follow up with urology as an outpatient in 2 weeks. 3. subacute/chronic right hip fracture femoral neck displaced;ortho following; is/p  post right hip arthroplasty yesterday. Disposition to SNF placement #4 .early onset Alzheimer's dementia with depression-progressively worsening over the last several months. Getting less communicative now.  Continue  Pyridostigmine., Namenda, Risperdal, Exelon patch. History of cerebral palsy  5.nutrition;spoke to pts POA,Maurice Clayton,Maurice Clayton,he told me pt prefers sweets and vegetables,  All the records are reviewed and case discussed with Care Management/Social Workerr. Management plans discussed with the patient, family and they are in agreement.  CODE STATUS: full  TOTAL TIME TAKING CARE OF THIS PATIENT: 35 minutes.   POSSIBLE D/C IN 1-2 DAYS, DEPENDING ON CLINICAL CONDITION.   Katha Hamming M.D on 02/10/2015 at 10:49 AM  Between 7am to 6pm - Pager - [phone]  After 6pm go to [email or web address] - password [email or web address] [facility]  Office  [phone]  CC: Primary care physician; No primary care provider on file.   Note: This dictation was prepared with Dragon dictation along with smaller phrase technology. Any transcriptional errors that result from this process are unintentional.

## 2015-02-10 NOTE — Progress Notes (Signed)
Dawn from [facility] contacted Visual merchandiserClinical Social Worker (CSW) this morning and reported that she will come out today to assess patient. Dawn requested MD note, Med list, PT and OT notes. CSW will provide Dawn with requested clinicals when she arrives.   Tammy liaison for [facility] and [facility] is also coming to assess patient today for SNF placement.   Jetta LoutBailey Morgan, LCSWA [phone]

## 2015-02-10 NOTE — Progress Notes (Signed)
Clinical Child psychotherapistocial Worker (CSW) received call from [facility] who reported that they can offer patient a bed at [facility]. CSW contacted patient's guardian Wayne SeverJeanette Beaudet [phone] and made her aware of above. Guardian gladly accepted bed offer. CSW explained to guardian that [facility] is still pending. Tammy is aware of accepted bed offer. CSW will continue to follow and assist as needed.   Jetta LoutBailey Morgan, LCSWA [phone]

## 2015-02-11 ENCOUNTER — Inpatient Hospital Stay: Payer: Medicare Other

## 2015-02-11 MED ORDER — OXYCODONE HCL 5 MG PO TABS: 5.0000 mg | ORAL_TABLET | ORAL | Status: DC | PRN

## 2015-02-11 MED ORDER — ENOXAPARIN SODIUM 40 MG/0.4ML ~~LOC~~ SOLN
40.0000 mg | SUBCUTANEOUS | Status: DC
Start: 2015-02-11 — End: 2015-03-19

## 2015-02-11 MED ORDER — ENSURE ENLIVE PO LIQD
237.0000 mL | Freq: Two times a day (BID) | ORAL | Status: DC
  Administered 2015-02-11 – 2015-02-13 (×3): 237 mL via ORAL

## 2015-02-11 NOTE — Progress Notes (Signed)
Patient ID: Maurice Clayton, male   DOB: 01/15/1961, 55 y.o.   MRN: [id number] [facility] PROGRESS NOTE  PCP: No primary care provider on file.  HPI/Subjective: Patient answers some yes or no questions. Offers no complaints. No complaints of pain.  Objective: Filed Vitals:   02/11/15 0759 02/11/15 1503  BP: 105/59 102/58  Pulse: 101 101  Temp: 100.1 F (37.8 C) 100 F (37.8 C)  Resp: 17 17    Intake/Output Summary (Last 24 hours) at 02/11/15 1641 Last data filed at 02/11/15 0900  Gross per 24 hour  Intake 2837.5 ml  Output      0 ml  Net 2837.5 ml   Filed Weights   02/06/15 1155  Weight: 48.535 kg (107 lb)    ROS: Review of Systems  Constitutional: Negative for fever and chills.  Eyes: Negative for blurred vision.  Respiratory: Negative for cough and shortness of breath.   Cardiovascular: Negative for chest pain.  Gastrointestinal: Positive for constipation. Negative for nausea, vomiting, abdominal pain and diarrhea.  Genitourinary: Negative for dysuria.  Musculoskeletal: Positive for joint pain.  Neurological: Negative for dizziness and headaches.   Exam: Physical Exam  HENT:  Nose: No mucosal edema.  Mouth/Throat: No oropharyngeal exudate or posterior oropharyngeal edema.  Eyes: Conjunctivae, EOM and lids are normal. Pupils are equal, round, and reactive to light.  Neck: No JVD present. Carotid bruit is not present. No edema present. No thyroid mass and no thyromegaly present.  Cardiovascular: S1 normal and S2 normal.  Exam reveals no gallop.   No murmur heard. Pulses:      Dorsalis pedis pulses are 2+ on the right side, and 2+ on the left side.  Respiratory: No respiratory distress. He has no wheezes. He has no rhonchi. He has no rales.  GI: Soft. Bowel sounds are normal. There is no tenderness.  Musculoskeletal:       Right ankle: He exhibits no swelling.       Left ankle: He exhibits no swelling.  Lymphadenopathy:    He has no cervical  adenopathy.  Neurological: He is alert.  Skin: Skin is warm. No rash noted. Nails show no clubbing.  Surgical site looks okay  Psychiatric: He has a normal mood and affect.      Data Reviewed: Basic Metabolic Panel:  Recent Labs Lab 02/06/15 1201 02/07/15 0319 02/09/15 2111 02/10/15 0412  NA 139 141  --  135  K 3.9 3.9  --  3.6  CL 103 106  --  102  CO2 29 31  --  25  GLUCOSE 96 100*  --  89  BUN 9 10  --  10  CREATININE 0.55* 0.83 0.55* 0.45*  CALCIUM 8.8* 8.3*  --  8.0*   Liver Function Tests:  Recent Labs Lab 02/06/15 1201  AST 11*  ALT 12*  ALKPHOS 101  BILITOT 0.4  PROT 6.2*  ALBUMIN 3.5   CBC:  Recent Labs Lab 02/06/15 1201 02/07/15 0319 02/09/15 2111 02/10/15 0412  WBC 6.3 7.6 8.2  --   NEUTROABS 4.3  --   --   --   HGB 15.2 14.8 12.8* 11.8*  HCT 46.1 44.0 37.9*  --   MCV 90.7 94.0 90.2  --   PLT 281 259 170  --    Cardiac Enzymes:  Recent Labs Lab 02/06/15 1201  TROPONINI <0.03     Recent Results (from the past 240 hour(s))  MRSA PCR Screening     Status: None  Collection Time: 02/08/15  3:32 PM  Result Value Ref Range Status   MRSA by PCR NEGATIVE NEGATIVE Final    Comment:        The GeneXpert MRSA Assay (FDA approved for NASAL specimens only), is one component of a comprehensive MRSA colonization surveillance program. It is not intended to diagnose MRSA infection nor to guide or monitor treatment for MRSA infections.      Studies: Dg Hip Operative Unilat With Pelvis Right  02/09/2015  CLINICAL DATA:  55 year old male status post right hip replacement. EXAM: OPERATIVE RIGHT HIP (WITH PELVIS IF PERFORMED) 3 VIEWS TECHNIQUE: Fluoroscopic spot image(s) were submitted for interpretation post-operatively. COMPARISON:  Right femur radiograph 02/06/2015. FINDINGS: 3 intraoperative views of the right hip demonstrate performance of right hip bipolar hemiarthroplasty. No definite periprosthetic fracture identified on this limited  examination. IMPRESSION: 1. Intraoperative documentation of right hip bipolar hemiarthroplasty. Electronically Signed   By: Trudie Reed M.D.   On: 02/09/2015 18:40   Dg Hip Unilat With Pelvis 2-3 Views Right  02/09/2015  CLINICAL DATA:  55 year old male status post right hip arthroplasty. EXAM: DG HIP (WITH OR WITHOUT PELVIS) 2-3V RIGHT COMPARISON:  No priors. FINDINGS: Two views of the right hip demonstrate postoperative changes of bipolar hemiarthroplasty in the right hip. No periprosthetic fracture or other immediate complicating features. Small amount of gas in the hip joint and overlying soft tissues, with lateral skin staples in place. Femoral head is properly located. IMPRESSION: 1. Postoperative changes of right hip bipolar hemiarthroplasty without acute complicating features, as above. Electronically Signed   By: Trudie Reed M.D.   On: 02/09/2015 19:18    Scheduled Meds: . aspirin EC  81 mg Oral QPM  . desmopressin  0.2 mg Oral QHS  . docusate sodium  100 mg Oral BID  . enoxaparin (LOVENOX) injection  40 mg Subcutaneous Q24H  . escitalopram  20 mg Oral Daily  . feeding supplement (ENSURE ENLIVE)  237 mL Oral BID WC  . hydrocortisone  1 application Rectal TID  . magnesium citrate  1 Bottle Oral Once  . memantine  28 mg Oral QPM  . mirtazapine  15 mg Oral QPM  . polyethylene glycol powder  1 Container Oral Once  . risperiDONE  1 mg Oral TID  . rivastigmine  4.6 mg Transdermal Daily   Continuous Infusions: . sodium chloride 50 mL/hr at 02/10/15 1810    Assessment/Plan:  1. Low-grade temperature. We'll get a chest x-ray, check his CBC in the a.m. wondering if this is just postoperative. 2. Constipation - continue stool softeners 3. Left kidney mass- seen by urology in 1 to watch with surveillance 4. Alzheimer's disease and depression- continue psychiatric medications 5. History of cerebral palsy 6. Right hip total arthroplasty after fracture. Patient will need 60-90 days  at skilled nursing. He currently lives at a group home in which he needs to ambulate. Length of stay will be dependent on his ability to ambulate  Code Status:     Code Status Orders        Start     Ordered   02/09/15 2018  Full code   Continuous     02/09/15 2017    Code Status History    Date Active Date Inactive Code Status Order ID Comments User Context   02/06/2015  6:28 PM 02/09/2015  8:18 PM Full Code [id number]  Enid Baas, MD Inpatient      Disposition Plan: Rehabilitation soon  Consultants:  Urology  Orthopedic  surgery  Procedures:  Right hip hemiarthroplasty  Time spent: 20 minutes  Alford HighlandWIETING, Jashan Cotten  [facility]

## 2015-02-11 NOTE — Discharge Instructions (Signed)
ANTERIOR APPROACH TOTAL HIP REPLACEMENT POSTOPERATIVE DIRECTIONS ° ° °Hip Rehabilitation, Guidelines Following Surgery  °The results of a hip operation are greatly improved after range of motion and muscle strengthening exercises. Follow all safety measures which are given to protect your hip. If any of these exercises cause increased pain or swelling in your joint, decrease the amount until you are comfortable again. Then slowly increase the exercises. Call your caregiver if you have problems or questions.  ° °HOME CARE INSTRUCTIONS  °Remove items at home which could result in a fall. This includes throw rugs or furniture in walking pathways.  °· ICE to the affected hip every three hours for 30 minutes at a time and then as needed for pain and swelling.  Continue to use ice on the hip for pain and swelling from surgery. You may notice swelling that will progress down to the foot and ankle.  This is normal after surgery.  Elevate the leg when you are not up walking on it.   °· Continue to use the breathing machine which will help keep your temperature down.  It is common for your temperature to cycle up and down following surgery, especially at night when you are not up moving around and exerting yourself.  The breathing machine keeps your lungs expanded and your temperature down. °· Do not place pillow under knee, focus on keeping the knee straight while resting ° °DIET °You may resume your previous home diet once your are discharged from the hospital. ° °DRESSING / WOUND CARE / SHOWERING °Keep the surgical dressing until follow up.  The dressing is water proof, so you can shower without any extra covering.  IF THE DRESSING FALLS OFF or the wound gets wet inside, change the dressing with sterile gauze.  Please use good hand washing techniques before changing the dressing.  Do not use any lotions or creams on the incision until instructed by your surgeon.   °Keep your dressing dry with showering.  You can keep it  covered and pat dry. °Change the surgical dressing daily and reapply a dry dressing each time. ° °ACTIVITY °Walk with your walker as instructed. °Use walker as long as suggested by your caregivers. °Avoid periods of inactivity such as sitting longer than an hour when not asleep. This helps prevent blood clots.  °You may resume a sexual relationship in one month or when given the OK by your doctor.  °You may return to work once you are cleared by your doctor.  °Do not drive a car for 6 weeks or until released by you surgeon.  °Do not drive while taking narcotics. ° °WEIGHT BEARING °Weight bearing as tolerated with assist device (walker, cane, etc) as directed, use it as long as suggested by your surgeon or therapist, typically at least 4-6 weeks. ° °POSTOPERATIVE CONSTIPATION PROTOCOL °Constipation - defined medically as fewer than three stools per week and severe constipation as less than one stool per week. ° °One of the most common issues patients have following surgery is constipation.  Even if you have a regular bowel pattern at home, your normal regimen is likely to be disrupted due to multiple reasons following surgery.  Combination of anesthesia, postoperative narcotics, change in appetite and fluid intake all can affect your bowels.  In order to avoid complications following surgery, here are some recommendations in order to help you during your recovery period. ° °Colace (docusate) - Pick up an over-the-counter form of Colace or another stool softener and take twice   a day as long as you are requiring postoperative pain medications.  Take with a full glass of water daily.  If you experience loose stools or diarrhea, hold the colace until you stool forms back up.  If your symptoms do not get better within 1 week or if they get worse, check with your doctor. ° °Dulcolax (bisacodyl) - Pick up over-the-counter and take as directed by the product packaging as needed to assist with the movement of your bowels.   Take with a full glass of water.  Use this product as needed if not relieved by Colace only.  ° °MiraLax (polyethylene glycol) - Pick up over-the-counter to have on hand.  MiraLax is a solution that will increase the amount of water in your bowels to assist with bowel movements.  Take as directed and can mix with a glass of water, juice, soda, coffee, or tea.  Take if you go more than two days without a movement. °Do not use MiraLax more than once per day. Call your doctor if you are still constipated or irregular after using this medication for 7 days in a row. ° °If you continue to have problems with postoperative constipation, please contact the office for further assistance and recommendations.  If you experience "the worst abdominal pain ever" or develop nausea or vomiting, please contact the office immediatly for further recommendations for treatment. ° °ITCHING ° If you experience itching with your medications, try taking only a single pain pill, or even half a pain pill at a time.  You can also use Benadryl over the counter for itching or also to help with sleep.  ° °TED HOSE STOCKINGS °Wear the elastic stockings on both legs for three weeks following surgery during the day but you may remove then at night for sleeping. ° °MEDICATIONS °See your medication summary on the “After Visit Summary” that the nursing staff will review with you prior to discharge.  You may have some home medications which will be placed on hold until you complete the course of blood thinner medication.  It is important for you to complete the blood thinner medication as prescribed by your surgeon.  Continue your approved medications as instructed at time of discharge. ° °PRECAUTIONS °If you experience chest pain or shortness of breath - call 911 immediately for transfer to the hospital emergency department.  °If you develop a fever greater that 101 F, purulent drainage from wound, increased redness or drainage from wound, foul odor  from the wound/dressing, or calf pain - CONTACT YOUR SURGEON.   °                                                °FOLLOW-UP APPOINTMENTS °Make sure you keep all of your appointments after your operation with your surgeon and caregivers. You should call the office at the above phone number and make an appointment for approximately two weeks after the date of your surgery or on the date instructed by your surgeon outlined in the "After Visit Summary". ° °RANGE OF MOTION AND STRENGTHENING EXERCISES  °These exercises are designed to help you keep full movement of your hip joint. Follow your caregiver's or physical therapist's instructions. Perform all exercises about fifteen times, three times per day or as directed. Exercise both hips, even if you have had only one joint replacement. These exercises can be   done on a training (exercise) mat, on the floor, on a table or on a bed. Use whatever works the best and is most comfortable for you. Use music or television while you are exercising so that the exercises are a pleasant break in your day. This will make your life better with the exercises acting as a break in routine you can look forward to.  °Lying on your back, slowly slide your foot toward your buttocks, raising your knee up off the floor. Then slowly slide your foot back down until your leg is straight again.  °Lying on your back spread your legs as far apart as you can without causing discomfort.  °Lying on your side, raise your upper leg and foot straight up from the floor as far as is comfortable. Slowly lower the leg and repeat.  °Lying on your back, tighten up the muscle in the front of your thigh (quadriceps muscles). You can do this by keeping your leg straight and trying to raise your heel off the floor. This helps strengthen the largest muscle supporting your knee.  °Lying on your back, tighten up the muscles of your buttocks both with the legs straight and with the knee bent at a comfortable angle while  keeping your heel on the floor.  ° °IF YOU ARE TRANSFERRED TO A SKILLED REHAB FACILITY °If the patient is transferred to a skilled rehab facility following release from the hospital, a list of the current medications will be sent to the facility for the patient to continue.  When discharged from the skilled rehab facility, please have the facility set up the patient's Home Health Physical Therapy prior to being released. Also, the skilled facility will be responsible for providing the patient with their medications at time of release from the facility to include their pain medication, the muscle relaxants, and their blood thinner medication. If the patient is still at the rehab facility at time of the two week follow up appointment, the skilled rehab facility will also need to assist the patient in arranging follow up appointment in our office and any transportation needs. ° °MAKE SURE YOU:  °Understand these instructions.  °Get help right away if you are not doing well or get worse.  ° ° °Pick up stool softner and laxative for home use following surgery while on pain medications. °Do not submerge incision under water. °Please use good hand washing techniques while changing dressing each day. °May shower starting three days after surgery. °Please use a clean towel to pat the incision dry following showers. °Continue to use ice for pain and swelling after surgery. °Do not use any lotions or creams on the incision until instructed by your surgeon. ° °

## 2015-02-11 NOTE — Evaluation (Signed)
Occupational Therapy Evaluation Patient Details Name: Maurice Clayton MRN: [id number] DOB: 04/24/1960 Today's Date: 02/11/2015    History of Present Illness Pt is a 55 y.o. male with PMH of CP and early onset alzheimer's dementia presenting to hospital with weakness, decreased intake by mouth, R knee and abdominal pain.  Pt admitted with severe constipation, subtle 12 mm L kidney mass, and old R hip fx (pt had fall from w/c about 3 months ago and pelvic x-rays were negative at that time per notes).  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  Of note, imaging 01/13/15 shows acute mildly displaced fx's involving distal phalanx in R 5th digit.   Clinical Impression   Patient is a 55 year old male with the above history. He lives in a care home. During this evaluation started cooperating, then shut down and did not answer any questions of participate. Answered that he is in the hospital but did not answers time, situation.No family around for previous status but chart review shows he had been modified independent with mobility using rolling walker until recently. He now shows deficits with pain, mobility and ADL. He would benefit from Occupational Therapy for ADL/functional mobility training while .staying within hip precautions (anterior approach).    Follow Up Recommendations       Equipment Recommendations       Recommendations for Other Services       Precautions / Restrictions Precautions Precautions: Fall;Anterior Hip Restrictions Weight Bearing Restrictions: Yes RLE Weight Bearing: Weight bearing as tolerated      Mobility Bed Mobility  Transfers    Balance                          ADL                                         General ADL Comments: .Patient now requires max A of 2 and sliding board to go from one surface to another and is now getting assist with self  feeding.      Vision     Perception     Praxis      Pertinent Vitals/Pain Pain Assessment:  (did not rate) Pain Score: 5  Pain Location: R hip/knee/foot with movement/WB'ing Pain Descriptors / Indicators: Guarding Pain Intervention(s): Limited activity within patient's tolerance;Monitored during session;Repositioned     Hand Dominance     Extremity/Trunk Assessment Upper Extremity Assessment Upper Extremity Assessment:  (did not follow directions but feels like ~ 3+/5)   Lower Extremity Assessment Lower Extremity Assessment: Defer to PT evaluation       Communication     Cognition Arousal/Alertness:  (Started out awake then became lathargic or passivly refusing.) Behavior During Therapy:  (a few minutes into eval, turned over and closed eyes and did not answer any further questions) Overall Cognitive Status: No family/caregiver present to determine baseline cognitive functioning (Pt oriented to name but not DOB, place, or time)       Memory: Decreased recall of precautions             General Comments       Exercises       Shoulder Instructions      Home Living Family/patient expects to be discharged to::  (has  been in a care home for 10 months.)                                        Prior Functioning/Environment Level of Independence: Needs assistance        Comments: Per notes, pt was ambulatory with RW initially when he went to care home but has had decreased mobility last 3-4 months    OT Diagnosis:     OT Problem List:     OT Treatment/Interventions:      OT Goals(Current goals can be found in the care plan section) Acute Rehab OT Goals Patient Stated Goal: None stated Time For Goal Achievement: 02/25/15 Potential to Achieve Goals: Good  OT Frequency:     Barriers to D/C:            Co-evaluation              End of Session    Activity Tolerance:   Patient left:     Time: 1610-9604 OT Time  Calculation (min): 14 min Charges:  OT General Charges $OT Visit: 1 Procedure OT Evaluation $OT Eval Low Complexity: 1 Procedure G-Codes:    Gwyndolyn Kaufman, MS/OTR/L  02/11/2015, 3:34 PM

## 2015-02-11 NOTE — Progress Notes (Signed)
Physical Therapy Treatment Patient Details Name: Maurice Clayton MRN: [id number] DOB: [birth year] Today's Date: 02/11/2015    History of Present Illness Pt is a 55 y.o. male with PMH of CP and early onset alzheimer's dementia presenting to hospital with weakness, decreased intake by mouth, R knee and abdominal pain.  Pt admitted with severe constipation, subtle 12 mm L kidney mass, and old R hip fx (pt had fall from w/c about 3 months ago and pelvic x-rays were negative at that time per notes).  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  Of note, imaging 01/13/15 shows acute mildly displaced fx's involving distal phalanx in R 5th digit.    PT Comments    Pt able to progress to taking a few steps to right with RW and 2 assist this afternoon; limited distance d/t decreasing upright posture and appearing painful (decreased stance time/WB'ing R LE).  Pt is progressing and working fairly well with physical therapy.  Continue to recommend pt discharge to STR.   Follow Up Recommendations  SNF     Equipment Recommendations   (TBD)    Recommendations for Other Services       Precautions / Restrictions Precautions Precautions: Fall;Anterior Hip Restrictions Weight Bearing Restrictions: Yes RLE Weight Bearing: Weight bearing as tolerated    Mobility  Bed Mobility Overal bed mobility: Needs Assistance Bed Mobility: Supine to Sit;Sit to Supine     Supine to sit: Min assist;Mod assist Sit to supine: Min assist;Mod assist   General bed mobility comments: pt required extra time and cueing supine to/from sit; assist for trunk and B LE's  Transfers Overall transfer level: Needs assistance Equipment used: Rolling walker (2 wheeled) Transfers: Sit to/from Stand Sit to Stand: Mod assist;Max assist         General transfer comment: pt required vc's for hand and feet placement; x4 repetitions; 3/4th stand each  time (decreased WB'ing noted R LE) but with vc's for upright posture pt able to stand briefly upright  Ambulation/Gait Ambulation/Gait assistance: Mod assist;+2 physical assistance Ambulation Distance (Feet):  (side step to R x3 steps) Assistive device: Rolling walker (2 wheeled)   Gait velocity: decreased   General Gait Details: decreased stance time R LE; vc's required for walker use (increase UE support through RW); antalgic; limited distance d/t pain   Stairs            Wheelchair Mobility    Modified Rankin (Stroke Patients Only)       Balance Overall balance assessment: Needs assistance Sitting-balance support: Bilateral upper extremity supported;Feet supported Sitting balance-Leahy Scale: Poor Sitting balance - Comments: pt intermittently with posterior lean and requiring vc's and assist for upright posture Postural control: Posterior lean Standing balance support: Bilateral upper extremity supported (on RW) Standing balance-Leahy Scale: Poor Standing balance comment: posterior lean noted                    Cognition Arousal/Alertness:  (Initially lethargic but became more awake with activity) Behavior During Therapy: Impulsive;Restless Overall Cognitive Status: No family/caregiver present to determine baseline cognitive functioning       Memory: Decreased recall of precautions              Exercises   Performed semi-supine B LE therapeutic exercise x 10 reps:  Ankle pumps (AAROM B LE's); SAQ's (AAROM R; AAROM L); heelslides (AAROM R; AAROM L), hip abd/adduction (AAROM  R; AAROM L).  Pt required vc's and tactile cues for correct technique and participation with exercises.     General Comments General comments (skin integrity, edema, etc.): R hip dressing in place; R IV line noted to be undone and leaking IV solution (nursing notified and came to address and also to change sheets d/t wet from IV solution leaking onto bed)  Nursing cleared pt for  participation in physical therapy.  Pt agreeable to PT session.      Pertinent Vitals/Pain Pain Assessment: Faces Pain Score: 5  Faces Pain Scale: Hurts even more Pain Location: R hip/knee/foot with movement/WB'ing Pain Descriptors / Indicators: Guarding Pain Intervention(s): Limited activity within patient's tolerance;Monitored during session;Repositioned    Home Living Family/patient expects to be discharged to::  (has been in a care home for 10 months.)                    Prior Function Level of Independence: Needs assistance      Comments: Per notes, pt was ambulatory with RW initially when he went to care home but has had decreased mobility last 3-4 months   PT Goals (current goals can now be found in the care plan section) Acute Rehab PT Goals Patient Stated Goal: Pt did not state any goals Progress towards PT goals: Progressing toward goals    Frequency  BID    PT Plan Current plan remains appropriate    Co-evaluation             End of Session Equipment Utilized During Treatment: Gait belt Activity Tolerance: Patient limited by pain;Patient limited by fatigue Patient left: in bed;with call bell/phone within reach;with bed alarm set     Time: 1525-1550 PT Time Calculation (min) (ACUTE ONLY): 25 min  Charges:  $Therapeutic Exercise: 8-22 mins $Therapeutic Activity: 8-22 mins                    G CodesHendricks Limes:      Neshia Mckenzie 02/11/2015, 4:31 PM Hendricks LimesEmily Akshath Mccarey, PT [phone]

## 2015-02-11 NOTE — Progress Notes (Signed)
Initial Nutrition Assessment   INTERVENTION:   Meals and Snacks: Cater to patient preferences, recommend Dysphagia III diet order for ease of chewing Coordination of Care: if pt at risk for aspiration, recommend SLP evaulation.  Also recommend new weight. Medical Food Supplement Therapy: will recommend Ensure Enlive po BID, each supplement provides 350 kcal and 20 grams of protein   NUTRITION DIAGNOSIS:   Biting/chewing difficulty related to chronic illness as evidenced by per patient/family report.  GOAL:   Patient will meet greater than or equal to 90% of their needs  MONITOR:    (Energy Intake, Digestive System, Anthropometrics, Skin)  REASON FOR ASSESSMENT:   LOS    ASSESSMENT:   Pt admitted from Group Home with h/o CP, early onset dementia. Pt admitted with left hip fracture, POD2 left hip arthroplasty.   Past Medical History  Diagnosis Date  . Cerebral palsy (HCC)     bed bound status  . Dementia   . HTN (hypertension)   . Depression   . Vitamin B 12 deficiency   . Hyperthyroidism     Diet Order:  DIET DYS 3 Room service appropriate?: Yes; Fluid consistency:: Thin    Current Nutrition: Per RN Verlon AuLeslie pt ate some hamburger at lunch today but mashed it/punched it to be able to eat it. Recorded po intake since admission varies from bites to 100%.  Food/Nutrition-Related History: Unable to determine from pt on visit. Per [facility]ST no decrease in appetite PTA.   Scheduled Medications:  . aspirin EC  81 mg Oral QPM  . desmopressin  0.2 mg Oral QHS  . docusate sodium  100 mg Oral BID  . enoxaparin (LOVENOX) injection  40 mg Subcutaneous Q24H  . escitalopram  20 mg Oral Daily  . feeding supplement (ENSURE ENLIVE)  237 mL Oral BID WC  . hydrocortisone  1 application Rectal TID  . magnesium citrate  1 Bottle Oral Once  . memantine  28 mg Oral QPM  . mirtazapine  15 mg Oral QPM  . polyethylene glycol powder  1 Container Oral Once  . risperiDONE  1 mg Oral TID  .  rivastigmine  4.6 mg Transdermal Daily    Continuous Medications:  . sodium chloride 50 mL/hr at 02/10/15 1810     Electrolyte/Renal Profile and Glucose Profile:   Recent Labs Lab 02/06/15 1201 02/07/15 0319 02/09/15 2111 02/10/15 0412  NA 139 141  --  135  K 3.9 3.9  --  3.6  CL 103 106  --  102  CO2 29 31  --  25  BUN 9 10  --  10  CREATININE 0.55* 0.83 0.55* 0.45*  CALCIUM 8.8* 8.3*  --  8.0*  GLUCOSE 96 100*  --  89   Protein Profile:   Recent Labs Lab 02/06/15 1201  ALBUMIN 3.5    Gastrointestinal Profile: Last BM:  02/10/2015   Nutrition-Focused Physical Exam Findings:  Unable to complete Nutrition-Focused physical exam at this time.    Weight Change: Pt unable to verbalize weight trend on visit. Per [facility] encounters possible weight loss of 6lbs in one month (5% weight loss in one month)   Skin:   (deep tissue injury to ankle)   Height:   Ht Readings from Last 1 Encounters:  02/06/15 5\' 7"  (1.702 m)    Weight:   Wt Readings from Last 1 Encounters:  02/06/15 107 lb (48.535 kg)    Wt Readings from Last 10 Encounters:  02/06/15 107 lb (48.535 kg)  01/13/15 107 lb (48.535 kg)  01/06/15 113 lb (51.256 kg)     Ideal Body Weight:   67kg  BMI:  Body mass index is 16.75 kg/(m^2).  Estimated Nutritional Needs:   Kcal:  using IBW of 67kg, BEE: 1449kcals, TEE: (IF 1.0-1.2)(AF 1.2) 1738-2086kcals  Protein:  67-80g protein (1.0-1.2g/kg)  Fluid:  1675-2036mL of fluid (25-98mL/kg)  EDUCATION NEEDS:   No education needs identified at this time   HIGH Care Level  Leda Quail, RD, LDN Pager [phone] Weekend/On-Call Pager [phone]

## 2015-02-11 NOTE — Clinical Social Work Note (Signed)
Level 2 pasrr still pending at this time. York SpanielMonica Kou Gucciardo MSW,LCSW [phone]

## 2015-02-11 NOTE — Clinical Social Work Note (Signed)
Dept of Mental Health/[facility] have contacted CSW and requested MD documentation regarding why patient will need STR and for approximately how long he will need it. MD is aware of the request and stated he will document this in his progress note today. CSW will await for this note and will fax to Dept of Mental Health/[facility] to Michael's attention: [phone].  York SpanielMonica Burkley Dech MSW,LCSW [phone]

## 2015-02-11 NOTE — Progress Notes (Signed)
  Subjective: 2 Days Post-Op Procedure(s) (LRB): TOTAL HIP ARTHROPLASTY ANTERIOR APPROACH (Right) Patient reports pain as mild.   Patient seen in rounds with Dr. Rosita KeaMenz. Patient is well, and has had no acute complaints or problems. The patient has minimal communication. Plan is to go Rehab after hospital stay. Negative for chest pain and shortness of breath Fever: no Gastrointestinal: Negative for nausea and vomiting. The patient has had a bowel movement.  Objective: Vital signs in last 24 hours: Temp:  [98.7 F (37.1 C)-100 F (37.8 C)] 100 F (37.8 C) (01/11 0332) Pulse Rate:  [93-110] 93 (01/11 0332) Resp:  [16-18] 18 (01/11 0332) BP: (108-124)/(47-84) 108/69 mmHg (01/11 0332) SpO2:  [97 %-100 %] 97 % (01/11 0332)  Intake/Output from previous day:  Intake/Output Summary (Last 24 hours) at 02/11/15 0722 Last data filed at 02/11/15 0500  Gross per 24 hour  Intake 2937.5 ml  Output      0 ml  Net 2937.5 ml    Intake/Output this shift:    Labs:  Recent Labs  02/09/15 2111 02/10/15 0412  HGB 12.8* 11.8*    Recent Labs  02/09/15 2111  WBC 8.2  RBC 4.21*  HCT 37.9*  PLT 170    Recent Labs  02/09/15 2111 02/10/15 0412  NA  --  135  K  --  3.6  CL  --  102  CO2  --  25  BUN  --  10  CREATININE 0.55* 0.45*  GLUCOSE  --  89  CALCIUM  --  8.0*   No results for input(s): LABPT, INR in the last 72 hours.   EXAM General - Patient is Alert and Oriented Extremity - Sensation intact distally Intact pulses distally Dorsiflexion/Plantar flexion intact Compartment soft Dressing/Incision - clean, dry, no drainage Motor Function - intact, moving foot and toes well on exam. No ambulation with physical therapy  Past Medical History  Diagnosis Date  . Cerebral palsy (HCC)     bed bound status  . Dementia   . HTN (hypertension)   . Depression   . Vitamin B 12 deficiency   . Hyperthyroidism     Assessment/Plan: 2 Days Post-Op Procedure(s) (LRB): TOTAL  HIP ARTHROPLASTY ANTERIOR APPROACH (Right) Active Problems:   Abdominal pain   Pressure ulcer  Estimated body mass index is 16.75 kg/(m^2) as calculated from the following:   Height as of this encounter: 5\' 7"  (1.702 m).   Weight as of this encounter: 48.535 kg (107 lb). Up with therapy Discharge to SNF when cleared medically  DVT Prophylaxis - Lovenox, Foot Pumps and TED hose Weight-Bearing as tolerated to right leg  Dedra Skeensodd Stephanne Greeley, PA-C Orthopaedic Surgery 02/11/2015, 7:22 AM

## 2015-02-11 NOTE — Progress Notes (Signed)
Physical Therapy Treatment Patient Details Name: Maurice LericheBarry W Clayton MRN: [id number] DOB: Dec 21, 1960 Today's Date: 02/11/2015    History of Present Illness Pt is a 55 y.o. male with PMH of CP and early onset alzheimer's dementia presenting to hospital with weakness, decreased intake by mouth, R knee and abdominal pain.  Pt admitted with severe constipation, subtle 12 mm L kidney mass, and old R hip fx (pt had fall from w/c about 3 months ago and pelvic x-rays were negative at that time per notes).  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  Of note, imaging 01/13/15 shows acute mildly displaced fx's involving distal phalanx in R 5th digit.    PT Comments    Pt demonstrating improved ability to stand today (3/4th stand x5 reps with mod to max assist x1 and use of RW) but still with decreased WB'ing through R LE (discussed over phone with MD Rosita KeaMenz regarding acute mildly displaced fx's involving distal phalanx in R 5th digit noted in 01/13/15 imaging and MD Rosita KeaMenz recommending pt be WBAT R LE).  Pt also participated in LE ex's (with assist and cueing) and sitting balance reaching activities.  Will continue to progress pt with bed mobility, transfers, balance, strengthening, and progress to ambulation when able.   Follow Up Recommendations  SNF     Equipment Recommendations   (TBD)    Recommendations for Other Services       Precautions / Restrictions Precautions Precautions: Fall;Anterior Hip Restrictions Weight Bearing Restrictions: Yes RLE Weight Bearing: Weight bearing as tolerated    Mobility  Bed Mobility Overal bed mobility: Needs Assistance Bed Mobility: Supine to Sit;Sit to Supine     Supine to sit: Min assist;Mod assist Sit to supine: Min assist;Mod assist   General bed mobility comments: pt required extra time and cueing supine to/from sit; assist for trunk and B LE's  Transfers Overall transfer  level: Needs assistance Equipment used: Rolling walker (2 wheeled) Transfers: Sit to/from Stand Sit to Stand: Mod assist;Max assist         General transfer comment: pt required vc's for hand and feet placement; x5 repetitions; 3/4th stand each time (decreased WB'ing noted R LE)--pt required vc's for upright posture  Ambulation/Gait             General Gait Details: not appropriate at this time   Stairs            Wheelchair Mobility    Modified Rankin (Stroke Patients Only)       Balance Overall balance assessment: Needs assistance Sitting-balance support: Bilateral upper extremity supported;Feet supported Sitting balance-Leahy Scale: Fair Sitting balance - Comments: pt SBA once set up in sitting; pt performed sitting reaching activities (with single UE support) B within and just outside BOS with min assist   Standing balance support: Bilateral upper extremity supported (on RW) Standing balance-Leahy Scale: Poor Standing balance comment: posterior lean noted                    Cognition Arousal/Alertness: Awake/alert Behavior During Therapy: Impulsive;Restless Overall Cognitive Status: No family/caregiver present to determine baseline cognitive functioning (Pt oriented to name but not DOB, place, or time)       Memory: Decreased recall of precautions              Exercises   Performed semi-supine B LE therapeutic exercise x 10 reps:  Ankle pumps (  AAROM B LE's); SAQ's (AAROM R; AAROM L); heelslides (AAROM R; AAROM L), hip abd/adduction (AAROM R; AAROM L).  Pt required vc's and tactile cues for correct technique and for participation with exercises.     General Comments General comments (skin integrity, edema, etc.): R hip dressing in place  Nursing cleared pt for participation in physical therapy.  Pt agreeable to PT session.      Pertinent Vitals/Pain Pain Assessment: Faces Pain Score: 5  Pain Location: R hip/knee/foot with  movement/WB'ing Pain Descriptors / Indicators: Guarding Pain Intervention(s): Limited activity within patient's tolerance;Monitored during session;Repositioned    Home Living                      Prior Function            PT Goals (current goals can now be found in the care plan section) Acute Rehab PT Goals Patient Stated Goal: Pt did not verbalize any goals when asked. Progress towards PT goals: Progressing toward goals    Frequency  BID    PT Plan Current plan remains appropriate    Co-evaluation             End of Session Equipment Utilized During Treatment: Gait belt Activity Tolerance: Patient limited by pain;Patient limited by fatigue Patient left: in bed;with call bell/phone within reach;with bed alarm set     Time: 1025-1050 PT Time Calculation (min) (ACUTE ONLY): 25 min  Charges:  $Therapeutic Exercise: 8-22 mins $Therapeutic Activity: 8-22 mins                    G CodesHendricks Limes 06-Mar-2015, 1:18 PM Hendricks Limes, PT [phone]

## 2015-02-12 LAB — CBC
HCT: 34.8 % — ABNORMAL LOW (ref 40.0–52.0)
HEMOGLOBIN: 11.8 g/dL — AB (ref 13.0–18.0)
MCH: 30.6 pg (ref 26.0–34.0)
MCHC: 33.8 g/dL (ref 32.0–36.0)
MCV: 90.5 fL (ref 80.0–100.0)
PLATELETS: 151 10*3/uL (ref 150–440)
RBC: 3.84 MIL/uL — ABNORMAL LOW (ref 4.40–5.90)
RDW: 13 % (ref 11.5–14.5)
WBC: 5.3 10*3/uL (ref 3.8–10.6)

## 2015-02-12 LAB — BASIC METABOLIC PANEL
Anion gap: 5 (ref 5–15)
BUN: 10 mg/dL (ref 6–20)
CHLORIDE: 104 mmol/L (ref 101–111)
CO2: 28 mmol/L (ref 22–32)
CREATININE: 0.51 mg/dL — AB (ref 0.61–1.24)
Calcium: 7.8 mg/dL — ABNORMAL LOW (ref 8.9–10.3)
GFR calc Af Amer: >60 mL/min (ref >60)
GFR calc non Af Amer: >60 mL/min (ref >60)
Glucose, Bld: 110 mg/dL — ABNORMAL HIGH (ref 65–99)
Potassium: 3.4 mmol/L — ABNORMAL LOW (ref 3.5–5.1)
SODIUM: 137 mmol/L (ref 135–145)

## 2015-02-12 LAB — SURGICAL PATHOLOGY

## 2015-02-12 MED ORDER — POTASSIUM CHLORIDE CRYS ER 20 MEQ PO TBCR
40.0000 meq | EXTENDED_RELEASE_TABLET | Freq: Once | ORAL | Status: AC
  Administered 2015-02-12: 40 meq via ORAL
  Filled 2015-02-12: qty 2

## 2015-02-12 MED ORDER — LEVOFLOXACIN IN D5W 750 MG/150ML IV SOLN
750.0000 mg | INTRAVENOUS | Status: DC
  Administered 2015-02-12 – 2015-02-13 (×2): 750 mg via INTRAVENOUS
  Filled 2015-02-12 (×2): qty 150

## 2015-02-12 NOTE — Clinical Social Work Note (Signed)
Paitent's level 2 pasrr came through and Tam[facility] at [facility] is aware. MD notified and CSW informed patient not to discharge today due to pneumonia. Tammy at [facility] updated. York SpanielMonica Fate Galanti MSW,LCSW

## 2015-02-12 NOTE — Clinical Social Work Note (Signed)
Information sent to [facility], still awaiting Level 2. York SpanielMonica Ernesha Ramone MSW,LCSW [phone]

## 2015-02-12 NOTE — Progress Notes (Signed)
  Subjective: 3 Days Post-Op Procedure(s) (LRB): TOTAL HIP ARTHROPLASTY ANTERIOR APPROACH (Right) Patient reports pain as mild.   Patient seen in rounds with Dr. Rosita KeaMenz. Patient is well, and has had no acute complaints or problems. The patient has minimal communication this morning, as he was resting. He was able to wake up with minimal answers to questions. Plan is to go Rehab after hospital stay. Negative for chest pain and shortness of breath Fever: no Gastrointestinal: Negative for nausea and vomiting. The patient has had a bowel movement.  Objective: Vital signs in last 24 hours: Temp:  [98.5 F (36.9 C)-100.1 F (37.8 C)] 99.2 F (37.3 C) (01/12 0408) Pulse Rate:  [91-106] 106 (01/12 0408) Resp:  [16-20] 16 (01/12 0408) BP: (95-130)/(53-77) 130/77 mmHg (01/12 0408) SpO2:  [94 %-98 %] 98 % (01/12 0408) Weight:  [50.213 kg (110 lb 11.2 oz)] 50.213 kg (110 lb 11.2 oz) (01/12 0500)  Intake/Output from previous day:  Intake/Output Summary (Last 24 hours) at 02/12/15 0619 Last data filed at 02/11/15 1300  Gross per 24 hour  Intake    240 ml  Output      0 ml  Net    240 ml    Intake/Output this shift:    Labs:  Recent Labs  02/09/15 2111 02/10/15 0412 02/12/15 0602  HGB 12.8* 11.8* 11.8*    Recent Labs  02/09/15 2111 02/12/15 0602  WBC 8.2 5.3  RBC 4.21* 3.84*  HCT 37.9* 34.8*  PLT 170 151    Recent Labs  02/09/15 2111 02/10/15 0412  NA  --  135  K  --  3.6  CL  --  102  CO2  --  25  BUN  --  10  CREATININE 0.55* 0.45*  GLUCOSE  --  89  CALCIUM  --  8.0*   No results for input(s): LABPT, INR in the last 72 hours.   EXAM General - Patient is Alert and Oriented Extremity - Sensation intact distally Intact pulses distally Dorsiflexion/Plantar flexion intact Compartment soft Dressing/Incision - clean, dry, no drainage Motor Function - intact, moving foot and toes well on exam. Bed to chair ambulation with physical therapy  Past Medical History   Diagnosis Date  . Cerebral palsy (HCC)     bed bound status  . Dementia   . HTN (hypertension)   . Depression   . Vitamin B 12 deficiency   . Hyperthyroidism     Assessment/Plan: 3 Days Post-Op Procedure(s) (LRB): TOTAL HIP ARTHROPLASTY ANTERIOR APPROACH (Right) Active Problems:   Abdominal pain   Pressure ulcer  Estimated body mass index is 17.33 kg/(m^2) as calculated from the following:   Height as of this encounter: 5\' 7"  (1.702 m).   Weight as of this encounter: 50.213 kg (110 lb 11.2 oz). Up with therapy Discharge to SNF when cleared medically  DVT Prophylaxis - Lovenox, Foot Pumps and TED hose Weight-Bearing as tolerated to right leg  Dedra Skeensodd Juwann Sherk, PA-C Orthopaedic Surgery 02/12/2015, 6:19 AM

## 2015-02-12 NOTE — Progress Notes (Signed)
Patient ID: Maurice Clayton, male   DOB: January 10, 1961, 55 y.o.   MRN: [id number] [facility] PROGRESS NOTE  PCP: No primary care provider on file.  HPI/Subjective: Patient answers some yes or no questions. Offers no complaints. No complaints of pain.  Objective: Filed Vitals:   02/12/15 0408 02/12/15 0801  BP: 130/77 113/67  Pulse: 106 96  Temp: 99.2 F (37.3 C) 99.6 F (37.6 C)  Resp: 16 18    Filed Weights   02/06/15 1155 02/12/15 0500  Weight: 48.535 kg (107 lb) 50.213 kg (110 lb 11.2 oz)    ROS: Review of Systems  Constitutional: Negative for fever and chills.  Eyes: Negative for blurred vision.  Respiratory: Negative for cough and shortness of breath.   Cardiovascular: Negative for chest pain.  Gastrointestinal: Negative for nausea, vomiting, abdominal pain, diarrhea and constipation.  Genitourinary: Negative for dysuria.  Musculoskeletal: Positive for joint pain.  Neurological: Negative for dizziness and headaches.   Exam: Physical Exam  HENT:  Nose: No mucosal edema.  Mouth/Throat: No oropharyngeal exudate or posterior oropharyngeal edema.  Eyes: Conjunctivae, EOM and lids are normal. Pupils are equal, round, and reactive to light.  Neck: No JVD present. Carotid bruit is not present. No edema present. No thyroid mass and no thyromegaly present.  Cardiovascular: S1 normal and S2 normal.  Exam reveals no gallop.   No murmur heard. Pulses:      Dorsalis pedis pulses are 2+ on the right side, and 2+ on the left side.  Respiratory: No respiratory distress. He has no wheezes. He has rhonchi in the right lower field and the left lower field. He has no rales.  GI: Soft. Bowel sounds are normal. There is no tenderness.  Musculoskeletal:       Right ankle: He exhibits no swelling.       Left ankle: He exhibits no swelling.  Lymphadenopathy:    He has no cervical adenopathy.  Neurological: He is alert.  Skin: Skin is warm. No rash noted. Nails show no  clubbing.  Surgical site looks okay  Psychiatric: He has a normal mood and affect.    Data Reviewed: Basic Metabolic Panel:  Recent Labs Lab 02/06/15 1201 02/07/15 0319 02/09/15 2111 02/10/15 0412 02/12/15 0602  NA 139 141  --  135 137  K 3.9 3.9  --  3.6 3.4*  CL 103 106  --  102 104  CO2 29 31  --  25 28  GLUCOSE 96 100*  --  89 110*  BUN 9 10  --  10 10  CREATININE 0.55* 0.83 0.55* 0.45* 0.51*  CALCIUM 8.8* 8.3*  --  8.0* 7.8*   Liver Function Tests:  Recent Labs Lab 02/06/15 1201  AST 11*  ALT 12*  ALKPHOS 101  BILITOT 0.4  PROT 6.2*  ALBUMIN 3.5   CBC:  Recent Labs Lab 02/06/15 1201 02/07/15 0319 02/09/15 2111 02/10/15 0412 02/12/15 0602  WBC 6.3 7.6 8.2  --  5.3  NEUTROABS 4.3  --   --   --   --   HGB 15.2 14.8 12.8* 11.8* 11.8*  HCT 46.1 44.0 37.9*  --  34.8*  MCV 90.7 94.0 90.2  --  90.5  PLT 281 259 170  --  151   Cardiac Enzymes:  Recent Labs Lab 02/06/15 1201  TROPONINI <0.03     Recent Results (from the past 240 hour(s))  MRSA PCR Screening     Status: None   Collection Time: 02/08/15  3:32 PM  Result Value Ref Range Status   MRSA by PCR NEGATIVE NEGATIVE Final    Comment:        The GeneXpert MRSA Assay (FDA approved for NASAL specimens only), is one component of a comprehensive MRSA colonization surveillance program. It is not intended to diagnose MRSA infection nor to guide or monitor treatment for MRSA infections.      Studies: Dg Chest 1 View  02/11/2015  CLINICAL DATA:  Patient has fever post admittance. Patient has a HX of HTN, dementia, and cerebral palsy. Best images possible for patient condition. EXAM: CHEST 1 VIEW COMPARISON:  02/06/2015 FINDINGS: Shallow lung inflation. Heart size is normal. There is patchy density in both medial lower lobes. Findings are consistent with infectious infiltrate. No pulmonary edema. IMPRESSION: Bilateral lower lobe infiltrates. Electronically Signed   By: Norva PavlovElizabeth  Brown M.D.    On: 02/11/2015 17:34    Scheduled Meds: . aspirin EC  81 mg Oral QPM  . desmopressin  0.2 mg Oral QHS  . docusate sodium  100 mg Oral BID  . enoxaparin (LOVENOX) injection  40 mg Subcutaneous Q24H  . escitalopram  20 mg Oral Daily  . feeding supplement (ENSURE ENLIVE)  237 mL Oral BID WC  . hydrocortisone  1 application Rectal TID  . levofloxacin (LEVAQUIN) IV  750 mg Intravenous Q24H  . magnesium citrate  1 Bottle Oral Once  . memantine  28 mg Oral QPM  . mirtazapine  15 mg Oral QPM  . polyethylene glycol powder  1 Container Oral Once  . potassium chloride  40 mEq Oral Once  . risperiDONE  1 mg Oral TID  . rivastigmine  4.6 mg Transdermal Daily   Continuous Infusions: . sodium chloride 50 mL/hr at 02/10/15 1810    Assessment/Plan:  1. Pneumonia postoperatively, Low-grade temperature yesterday. High-dose IV Levaquin this morning and tomorrow morning and then switch over to oral upon discharge to rehabilitation 2. Constipation - continue stool softeners 3. Left kidney mass- seen by urology and patient will  watch with surveillance 4. Alzheimer's disease and depression- continue psychiatric medications 5. History of cerebral palsy 6. Right hip total arthroplasty after fracture. Patient will need 60-90 days at skilled nursing. He currently lives at a group home in which he needs to ambulate. Length of stay will be dependent on his ability to ambulate  Code Status:     Code Status Orders        Start     Ordered   02/09/15 2018  Full code   Continuous     02/09/15 2017    Code Status History    Date Active Date Inactive Code Status Order ID Comments User Context   02/06/2015  6:28 PM 02/09/2015  8:18 PM Full Code [id number]  Enid Baasadhika Kalisetti, MD Inpatient      Disposition Plan: Rehabilitation potentially tomorrow if remains afebrile today  Consultants:  Urology  Orthopedic surgery  Procedures:  Right hip hemiarthroplasty  Time spent: 20 minutes  Alford HighlandWIETING,  Arrin Pintor  [facility]

## 2015-02-12 NOTE — Progress Notes (Signed)
Physical Therapy Treatment Patient Details Name: Maurice Clayton MRN: [id number] DOB: [birth year] Today's Date: 02/12/2015    History of Present Illness Pt is a 55 y.o. male with PMH of CP and early onset alzheimer's dementia presenting to hospital with weakness, decreased intake by mouth, R knee and abdominal pain.  Pt admitted with severe constipation, subtle 12 mm L kidney mass, and old R hip fx (pt had fall from w/c about 3 months ago and pelvic x-rays were negative at that time per notes).  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  Of note, imaging 01/13/15 shows acute mildly displaced fx's involving distal phalanx in R 5th digit.    PT Comments    Pt took a couple steps forwards and backwards with RW & 2 assist but limited d/t c/o R LE pain and fatigue.  Pt does fairly well participating in PT session and following simple commands.  Will continue to focus on transfers and progressive gait training per pt tolerance.   Follow Up Recommendations  SNF     Equipment Recommendations   (TBD)    Recommendations for Other Services       Precautions / Restrictions Precautions Precautions: Fall;Anterior Hip Restrictions Weight Bearing Restrictions: Yes RLE Weight Bearing: Weight bearing as tolerated    Mobility  Bed Mobility Overal bed mobility: Needs Assistance Bed Mobility: Supine to Sit;Sit to Supine     Supine to sit: Min assist;Mod assist Sit to supine: Min assist;Mod assist   General bed mobility comments: pt required extra time and cueing supine to/from sit; assist for trunk and B LE's  Transfers Overall transfer level: Needs assistance Equipment used: Rolling walker (2 wheeled) Transfers: Sit to/from Stand Sit to Stand: Mod assist;Max assist         General transfer comment: pt required vc's for hand and feet placement; x5 repetitions; 3/4th stand each time (decreased WB'ing noted R LE)  but with vc's for upright posture pt able to stand briefly upright  Ambulation/Gait Ambulation/Gait assistance: Mod assist;+2 physical assistance Ambulation Distance (Feet):  (2 steps forward and 2 steps backwards) Assistive device: Rolling walker (2 wheeled)   Gait velocity: decreased   General Gait Details: decreased stance time R LE; vc's required for walker use (increase UE support through RW); antalgic; limited distance d/t pain and pt trying to sit down early   Stairs            Wheelchair Mobility    Modified Rankin (Stroke Patients Only)       Balance Overall balance assessment: Needs assistance Sitting-balance support: Bilateral upper extremity supported;Feet supported Sitting balance-Leahy Scale: Fair Sitting balance - Comments: SBA once set up on edge of bed   Standing balance support: Bilateral upper extremity supported (on RW) Standing balance-Leahy Scale: Poor Standing balance comment: posterior lean noted                    Cognition Arousal/Alertness: Awake/alert Behavior During Therapy: Impulsive;Restless Overall Cognitive Status: No family/caregiver present to determine baseline cognitive functioning       Memory: Decreased recall of precautions              Exercises      General Comments General comments (skin integrity, edema, etc.): R hip dressing in place  Nursing cleared pt for participation in physical therapy.  Pt agreeable to PT session.      Pertinent  Vitals/Pain Pain Assessment: Faces Pain Score: 5  Pain Location: R hip/knee/foot with movement/WB'ing Pain Descriptors / Indicators: Guarding Pain Intervention(s): Limited activity within patient's tolerance;Monitored during session;Repositioned  Vitals stable and WFL throughout treatment session.    Home Living                      Prior Function            PT Goals (current goals can now be found in the care plan section) Acute Rehab PT Goals Patient  Stated Goal: Pt did not state any goals Progress towards PT goals: Progressing toward goals    Frequency  BID    PT Plan Current plan remains appropriate    Co-evaluation             End of Session Equipment Utilized During Treatment: Gait belt Activity Tolerance: Patient limited by pain;Patient limited by fatigue Patient left: in bed;with call bell/phone within reach;with bed alarm set     Time: 1132-1155 PT Time Calculation (min) (ACUTE ONLY): 23 min  Charges:  $Therapeutic Activity: 23-37 mins                    G CodesHendricks Limes Mar 14, 2015, 12:40 PM Hendricks Limes, PT [phone]

## 2015-02-12 NOTE — Progress Notes (Signed)
Physical Therapy Treatment Patient Details Name: Maurice Clayton MRN: [id number] DOB: 1960/04/02 Today's Date: 02/12/2015    History of Present Illness Pt is a 55 y.o. male with PMH of CP and early onset alzheimer's dementia presenting to hospital with weakness, decreased intake by mouth, R knee and abdominal pain.  Pt admitted with severe constipation, subtle 12 mm L kidney mass, and old R hip fx (pt had fall from w/c about 3 months ago and pelvic x-rays were negative at that time per notes).  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  Of note, imaging 01/13/15 shows acute mildly displaced fx's involving distal phalanx in R 5th digit.    PT Comments    Pt continues to participate fairly well in physical therapy and does well with simple commands.  Pt worked on marching in place with 2 assist and RW but limited d/t c/o R LE pain with WB'ing.  Pt did fairly well with scooting R and L on edge of bed with max assist x1.  Will continue to progress pt per pt tolerance with functional mobility.   Follow Up Recommendations  SNF     Equipment Recommendations   (TBD)    Recommendations for Other Services       Precautions / Restrictions Precautions Precautions: Fall;Anterior Hip Restrictions Weight Bearing Restrictions: Yes RLE Weight Bearing: Weight bearing as tolerated    Mobility  Bed Mobility Overal bed mobility: Needs Assistance Bed Mobility: Supine to Sit;Sit to Supine;Rolling Rolling: Supervision   Supine to sit: Min assist;Mod assist Sit to supine: Min assist;Mod assist   General bed mobility comments: assist for trunk and B LE's; vc's for technique  Transfers Overall transfer level: Needs assistance Equipment used: Rolling walker (2 wheeled) Transfers: Sit to/from Stand;Lateral/Scoot Transfers Sit to Stand: Min assist;Mod assist        Lateral/Scoot Transfers: Max assist (x3 reps to L and  x2 reps to R sitting on edge of bed) General transfer comment: pt required vc's for hand and feet placement; x5 repetitions; pt requiring vc's for upright posture (pt able to stand briefly upright with RW); decreased WB'ing R LE noted  Ambulation/Gait Ambulation/Gait assistance: Mod assist;+2 physical assistance   Assistive device: Rolling walker (2 wheeled)       General Gait Details: decreased stance time R LE; vc's required for walker use (increase UE support through RW); antalgic; pt marched in place 3 trials (4 marches; 3 marches; 1 march)--limited d/t R LE pain   Stairs            Wheelchair Mobility    Modified Rankin (Stroke Patients Only)       Balance Overall balance assessment: Needs assistance Sitting-balance support: Bilateral upper extremity supported;Feet supported Sitting balance-Leahy Scale: Fair Sitting balance - Comments: SBA once set up on edge of bed   Standing balance support: Bilateral upper extremity supported (on RW) Standing balance-Leahy Scale: Poor Standing balance comment: posterior lean noted                    Cognition Arousal/Alertness: Awake/alert Behavior During Therapy: Impulsive Overall Cognitive Status: No family/caregiver present to determine baseline cognitive functioning       Memory: Decreased recall of precautions              Exercises      General Comments General comments (skin integrity, edema, etc.): R hip dressing in  place      Pertinent Vitals/Pain Pain Assessment: Faces Faces Pain Scale: Hurts even more Pain Location: R hip/knee/foot with movement/WB'ing Pain Descriptors / Indicators: Operative site guarding;Guarding Pain Intervention(s): Limited activity within patient's tolerance;Monitored during session;Repositioned  Vitals stable and WFL throughout treatment session.    Home Living                      Prior Function            PT Goals (current goals can now be found in  the care plan section) Acute Rehab PT Goals Patient Stated Goal: Pt did not state any goals Progress towards PT goals: Progressing toward goals    Frequency  BID    PT Plan Current plan remains appropriate    Co-evaluation             End of Session Equipment Utilized During Treatment: Gait belt Activity Tolerance: Patient limited by pain;Patient limited by fatigue Patient left: in bed;with call bell/phone within reach;with bed alarm set     Time: 1540-1603 PT Time Calculation (min) (ACUTE ONLY): 23 min  Charges:  $Therapeutic Activity: 23-37 mins                    G CodesHendricks Limes:      Shonda Mandarino 02/12/2015, 4:56 PM Hendricks LimesEmily Raechell Singleton, PT [phone]

## 2015-02-13 MED ORDER — ENSURE ENLIVE PO LIQD: 237.0000 mL | Freq: Two times a day (BID) | ORAL | Status: DC

## 2015-02-13 MED ORDER — LEVOFLOXACIN 500 MG PO TABS: 750.0000 mg | ORAL_TABLET | Freq: Every day | ORAL | Status: DC

## 2015-02-13 MED ORDER — LEVOFLOXACIN 750 MG PO TABS: ORAL_TABLET | ORAL | Status: DC

## 2015-02-13 MED ORDER — DOCUSATE SODIUM 100 MG PO CAPS: 100.0000 mg | ORAL_CAPSULE | Freq: Two times a day (BID) | ORAL | Status: AC

## 2015-02-13 NOTE — Discharge Summary (Signed)
[facility] -  at [facility]   PATIENT NAME: Maurice Clayton    MR#:  [id number]  DATE OF BIRTH:  03/18/1960  DATE OF ADMISSION:  02/06/2015 ADMITTING PHYSICIAN: Enid Baas, MD  DATE OF DISCHARGE: 02/13/2015  PRIMARY CARE PHYSICIAN: No primary care provider on file.    ADMISSION DIAGNOSIS:  Fecal impaction of colon (HCC) [K56.41] Pain [R52] Renal mass [N28.89] Hip fracture, right, closed, initial encounter (HCC) [S72.001A]  DISCHARGE DIAGNOSIS:  Active Problems:   Abdominal pain   Pressure ulcer   SECONDARY DIAGNOSIS:   Past Medical History  Diagnosis Date  . Cerebral palsy (HCC)     bed bound status  . Dementia   . HTN (hypertension)   . Depression   . Vitamin B 12 deficiency   . Hyperthyroidism     HOSPITAL COURSE:   1. Right hip fracture requiring operative repair. Please see operative report by orthopedic surgery. Patient had a right total hip arthroplasty on 02/09/2015. Please see orthopedic discharge instructions. 2. Postoperative pneumonia- patient had a low-grade fever and chest x-ray showing bilateral pneumonia. Patient was started on high-dose Levaquin and will complete a course. Antibiotics to be stopped after 5 days. 3. Severe constipation with fecal impaction. We got very aggressive with anti-constipation medications and patient had numerous bowel movements. Patient will be on Colace twice a day. 4. History of cerebral palsy 5. History of Alzheimer's disease and depression- continue psychiatric medications 6. Left kidney mass. Seen by urology- they will watch with surveillance. Patient seen in consultation by Dr. Wilkie Aye. And he recommended follow-up in 2-3 weeks.  DISCHARGE CONDITIONS:   Fair  CONSULTS OBTAINED:  Treatment Team:  Kennedy Bucker, MD Malen Gauze, MD  DRUG ALLERGIES:  No Known Allergies  DISCHARGE MEDICATIONS:   Current Discharge Medication List    START taking these medications    Details  docusate sodium (COLACE) 100 MG capsule Take 1 capsule (100 mg total) by mouth 2 (two) times daily. Qty: 10 capsule, Refills: 0    enoxaparin (LOVENOX) 40 MG/0.4ML injection Inject 0.4 mLs (40 mg total) into the skin daily. Qty: 14 Syringe, Refills: 0    feeding supplement, ENSURE ENLIVE, (ENSURE ENLIVE) LIQD Take 237 mLs by mouth 2 (two) times daily with a meal. Qty: 237 mL, Refills: 12    levofloxacin (LEVAQUIN) 750 MG tablet One a day for five days then stop Qty: 5 tablet, Refills: 0    oxyCODONE (OXY IR/ROXICODONE) 5 MG immediate release tablet Take 1-2 tablets (5-10 mg total) by mouth every 4 (four) hours as needed for breakthrough pain. Qty: 30 tablet, Refills: 0      CONTINUE these medications which have NOT CHANGED   Details  acetaminophen (TYLENOL) 325 MG tablet Take 325 mg by mouth every 6 (six) hours as needed for mild pain, fever or headache.    aspirin EC 81 MG tablet Take 81 mg by mouth every evening.    desmopressin (DDAVP) 0.1 MG tablet Take 0.25 mg by mouth at bedtime.     escitalopram (LEXAPRO) 20 MG tablet Take 20 mg by mouth daily.    hydrocortisone (ANUSOL-HC) 2.5 % rectal cream Place 1 application rectally 3 (three) times daily.    memantine (NAMENDA XR) 28 MG CP24 24 hr capsule Take 28 mg by mouth every evening.    mirtazapine (REMERON) 15 MG tablet Take 15 mg by mouth every evening.     risperiDONE (RISPERDAL) 1 MG tablet Take 1 mg by mouth 3 (  three) times daily.    rivastigmine (EXELON) 4.6 mg/24hr Place 4.6 mg onto the skin daily.         DISCHARGE INSTRUCTIONS:    Follow-up with Dr. rehabilitation one day Follow-up with Dr. Rosita Kea orthopedic surgery as scheduled  Follow-up with urology 2-3 weeks   If you experience worsening of your admission symptoms, develop shortness of breath, life threatening emergency, suicidal or homicidal thoughts you must seek medical attention immediately by calling 911 or calling your MD immediately  if  symptoms less severe.  You Must read complete instructions/literature along with all the possible adverse reactions/side effects for all the Medicines you take and that have been prescribed to you. Take any new Medicines after you have completely understood and accept all the possible adverse reactions/side effects.   Please note  You were cared for by a hospitalist during your hospital stay. If you have any questions about your discharge medications or the care you received while you were in the hospital after you are discharged, you can call the unit and asked to speak with the hospitalist on call if the hospitalist that took care of you is not available. Once you are discharged, your primary care physician will handle any further medical issues. Please note that NO REFILLS for any discharge medications will be authorized once you are discharged, as it is imperative that you return to your primary care physician (or establish a relationship with a primary care physician if you do not have one) for your aftercare needs so that they can reassess your need for medications and monitor your lab values.    Today   CHIEF COMPLAINT:   Chief Complaint  Patient presents with  . Weakness    HISTORY OF PRESENT ILLNESS:  Maurice Clayton  is a 55 y.o. male with a known history of cerebral palsy presented with weakness   VITAL SIGNS:  Blood pressure 107/74, pulse 100, temperature 98.3 F (36.8 C), temperature source Oral, resp. rate 18, height 5\' 7"  (1.702 m), weight 52.753 kg (116 lb 4.8 oz), SpO2 96 %.    PHYSICAL EXAMINATION:  GENERAL:  55 y.o.-year-old patient lying in the bed with no acute distress.  EYES: Pupils equal, round, reactive to light and accommodation.  HEENT: Head atraumatic, normocephalic. Oropharynx and nasopharynx clear.  NECK:  Supple, no jugular venous distention. No thyroid enlargement, no tenderness.  LUNGS: Normal breath sounds bilaterally, no wheezing, rales,rhonchi or  crepitation. No use of accessory muscles of respiration.  CARDIOVASCULAR: S1, S2 normal.  and a 6 systolic murmurs,  no rubs, or gallops.  ABDOMEN: Soft, non-tender, non-distended. Bowel sounds present. No organomegaly or mass.  EXTREMITIES: No pedal edema, cyanosis, or clubbing.  NEUROLOGIC: Cranial nerves II through XII are intact. Muscle strength 5/5 in all extremities. Sensation intact. Gait not checked.  PSYCHIATRIC: The patient is alert. Answers some yes or no no questions  SKIN: No obvious rash, lesion, or ulcer. Surgical site is clean and dry  DATA REVIEW:   CBC  Recent Labs Lab 02/12/15 0602  WBC 5.3  HGB 11.8*  HCT 34.8*  PLT 151    Chemistries   Recent Labs Lab 02/06/15 1201  02/12/15 0602  NA 139  < > 137  K 3.9  < > 3.4*  CL 103  < > 104  CO2 29  < > 28  GLUCOSE 96  < > 110*  BUN 9  < > 10  CREATININE 0.55*  < > 0.51*  CALCIUM 8.8*  < >  7.8*  AST 11*  --   --   ALT 12*  --   --   ALKPHOS 101  --   --   BILITOT 0.4  --   --   < > = values in this interval not displayed.  Cardiac Enzymes  Recent Labs Lab 02/06/15 1201  TROPONINI <0.03    Microbiology Results  Results for orders placed or performed during the hospital encounter of 02/06/15  MRSA PCR Screening     Status: None   Collection Time: 02/08/15  3:32 PM  Result Value Ref Range Status   MRSA by PCR NEGATIVE NEGATIVE Final    Comment:        The GeneXpert MRSA Assay (FDA approved for NASAL specimens only), is one component of a comprehensive MRSA colonization surveillance program. It is not intended to diagnose MRSA infection nor to guide or monitor treatment for MRSA infections.     RADIOLOGY:  Dg Chest 1 View  02/11/2015  CLINICAL DATA:  Patient has fever post admittance. Patient has a HX of HTN, dementia, and cerebral palsy. Best images possible for patient condition. EXAM: CHEST 1 VIEW COMPARISON:  02/06/2015 FINDINGS: Shallow lung inflation. Heart size is normal. There is  patchy density in both medial lower lobes. Findings are consistent with infectious infiltrate. No pulmonary edema. IMPRESSION: Bilateral lower lobe infiltrates. Electronically Signed   By: Norva PavlovElizabeth  Brown M.D.   On: 02/11/2015 17:34    Management plans discussed with the patient, family and they are in agreement.  CODE STATUS:     Code Status Orders        Start     Ordered   02/09/15 2018  Full code   Continuous     02/09/15 2017    Code Status History    Date Active Date Inactive Code Status Order ID Comments User Context   02/06/2015  6:28 PM 02/09/2015  8:18 PM Full Code [id number]  Enid Baasadhika Kalisetti, MD Inpatient      TOTAL TIME TAKING CARE OF THIS PATIENT: 32  minutes.    Alford HighlandWIETING, Naisha Wisdom M.D on 02/13/2015 at 7:57 AM  Between 7am to 6pm - Pager - [phone]  After 6pm go to [email or web address] - password [email or web address] [email or web address]  [facility]  Office  [phone]  CC: Primary care physician; No primary care provider on file.

## 2015-02-13 NOTE — Progress Notes (Signed)
Physical Therapy Treatment Patient Details Name: Maurice Clayton MRN: [id number] DOB: 04/08/1960 Today's Date: 02/13/2015    History of Present Illness Pt is a 56 y.o. male with PMH of CP and early onset alzheimer's dementia presenting to hospital with weakness, decreased intake by mouth, R knee and abdominal pain.  Pt admitted with severe constipation, subtle 12 mm L kidney mass, and old R hip fx (pt had fall from w/c about 3 months ago and pelvic x-rays were negative at that time per notes).  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  Of note, imaging 01/13/15 shows acute mildly displaced fx's involving distal phalanx in R 5th digit.    PT Comments    Pt initially mod assist to stand with RW but with repetitions decreased to min assist.  Pt required cueing for upright posture each standing attempt but pt able to stand for 10 seconds with assist on 5th standing attempt.  Pt easily woke up for PT session and did fairly well participating.  Pt did vocalize specifically that his right "hip" hurt and also asked the PT her name during the session and then repeated her name.  Pt does fairly well with simple commands and yes/no questions (pt will verbalize other words at times too).  Plan for discharge to STR today.   Follow Up Recommendations  SNF     Equipment Recommendations   (TBD)    Recommendations for Other Services       Precautions / Restrictions Precautions Precautions: Fall;Anterior Hip Restrictions Weight Bearing Restrictions: Yes RLE Weight Bearing: Weight bearing as tolerated    Mobility  Bed Mobility Overal bed mobility: Needs Assistance Bed Mobility: Supine to Sit;Sit to Supine;Rolling Rolling: Supervision (to R and L)   Supine to sit: Min assist;Mod assist Sit to supine: Min assist;Mod assist   General bed mobility comments: assist for trunk and B LE's; vc's for  technique  Transfers Overall transfer level: Needs assistance Equipment used: Rolling walker (2 wheeled) Transfers: Sit to/from Stand Sit to Stand: Min assist;Mod assist         General transfer comment: pt required initial vc's for hand and feet placement; x5 repetitions; pt requiring vc's for upright posture (pt able to stand briefly upright with RW and on 5th attempt stood for 10 seconds with assist and cueing); decreased WB'ing R LE noted  Ambulation/Gait             General Gait Details: deferred d/t 2nd assist not available for session   Stairs            Wheelchair Mobility    Modified Rankin (Stroke Patients Only)       Balance Overall balance assessment: Needs assistance Sitting-balance support: Bilateral upper extremity supported;Feet supported Sitting balance-Leahy Scale: Fair Sitting balance - Comments: SBA once set up on edge of bed   Standing balance support: Bilateral upper extremity supported (on RW) Standing balance-Leahy Scale: Poor Standing balance comment: mild posterior lean but decreased with repetitions standing                    Cognition Arousal/Alertness:  (Pt sleeping upon arrival but woke up easily with activity and cueing) Behavior During Therapy:  (mildly impulsive but easily redirected) Overall Cognitive Status: No family/caregiver present to determine baseline cognitive functioning       Memory: Decreased recall of precautions  Exercises   Performed semi-supine B LE therapeutic exercise x 10 reps:  Ankle pumps (AAROM B LE's); SAQ's (AAROM R; AAROM L); heelslides (AAROM R; AAROM L), hip abd/adduction (AAROM R; AAROM L).  Pt required vc's and tactile cues for correct technique and participation with exercises.     General Comments General comments (skin integrity, edema, etc.): mild drainage noted in R hip dressing  Nursing cleared pt for participation in physical therapy.  Pt agreeable to PT  session.      Pertinent Vitals/Pain Pain Assessment: Faces Faces Pain Scale: Hurts even more Pain Location: R hip with movement/WB'ing Pain Descriptors / Indicators: Operative site guarding Pain Intervention(s): Limited activity within patient's tolerance;Monitored during session;Repositioned  Vitals stable and WFL throughout treatment session.    Home Living                      Prior Function            PT Goals (current goals can now be found in the care plan section) Acute Rehab PT Goals Patient Stated Goal: Pt did not state any goals Progress towards PT goals: Progressing toward goals    Frequency  BID    PT Plan Current plan remains appropriate    Co-evaluation             End of Session Equipment Utilized During Treatment: Gait belt Activity Tolerance: Patient limited by pain;Patient limited by fatigue Patient left: in bed;with call bell/phone within reach;with bed alarm set     Time: 0937-1001 PT Time Calculation (min) (ACUTE ONLY): 24 min  Charges:  $Therapeutic Exercise: 8-22 mins $Therapeutic Activity: 8-22 mins                    G CodesHendricks Limes:      Maurice Clayton 02/13/2015, 10:53 AM Hendricks LimesEmily Zani Kyllonen, PT [phone]

## 2015-02-13 NOTE — NC FL2 (Signed)
[place] MEDICAID FL2 LEVEL OF CARE SCREENING TOOL     IDENTIFICATION  Patient Name: Maurice Clayton Birthdate: 04/07/1960 Sex: male Admission Date (Current Location): 02/06/2015  [facility] and [place] Number:  [address]  ([id number] [facility]) Facility and Address:  [facility], [address] [place], [place] [address]      Provider Number: [id number]  Attending Physician Name and Address:  Alford Highland, MD  Relative Name and Phone Number:       Current Level of Care: Hospital Recommended Level of Care: Skilled Nursing Facility Prior Approval Number:    Date Approved/Denied:   PASRR Number:  [id number])  Discharge Plan: SNF    Current Diagnoses: Patient Active Problem List   Diagnosis Date Noted  . Pressure ulcer 02/07/2015  . Abdominal pain 02/06/2015    Orientation RESPIRATION BLADDER Height & Weight       Normal Incontinent 5\' 7"  (170.2 cm) 107 lbs.  BEHAVIORAL SYMPTOMS/MOOD NEUROLOGICAL BOWEL NUTRITION STATUS      Incontinent Diet (regular )  AMBULATORY STATUS COMMUNICATION OF NEEDS Skin   Total Care Verbally (difficult to understand) PU Stage and Appropriate Care                       Personal Care Assistance Level of Assistance  Bathing, Feeding, Dressing, Total care Bathing Assistance: Maximum assistance Feeding assistance: Limited assistance Dressing Assistance: Maximum assistance Total Care Assistance: Maximum assistance   Functional Limitations Info  Sight, Hearing, Speech Sight Info: Impaired Hearing Info: Adequate Speech Info: Impaired    SPECIAL CARE FACTORS FREQUENCY  PT (By licensed PT)                    Contractures      Additional Factors Info  Allergies   Allergies Info:  (NKA)           Current Medications (02/13/2015):  This is the current hospital active medication list Current Facility-Administered Medications  Medication Dose Route Frequency Provider Last Rate Last Dose  . acetaminophen  (TYLENOL) tablet 650 mg  650 mg Oral Q6H PRN Enid Baas, MD   650 mg at 02/11/15 1846   Or  . acetaminophen (TYLENOL) suppository 650 mg  650 mg Rectal Q6H PRN Enid Baas, MD      . alum & mag hydroxide-simeth (MAALOX/MYLANTA) 200-200-20 MG/5ML suspension 30 mL  30 mL Oral Q4H PRN Kennedy Bucker, MD      . aspirin EC tablet 81 mg  81 mg Oral QPM Enid Baas, MD   81 mg at 02/12/15 1639  . bisacodyl (DULCOLAX) suppository 10 mg  10 mg Rectal Daily PRN Enid Baas, MD      . desmopressin (DDAVP) tablet 0.2 mg  0.2 mg Oral QHS Enid Baas, MD   0.2 mg at 02/12/15 2221  . diphenhydrAMINE (BENADRYL) 12.5 MG/5ML elixir 12.5-25 mg  12.5-25 mg Oral Q4H PRN Kennedy Bucker, MD      . docusate sodium (COLACE) capsule 100 mg  100 mg Oral BID Enid Baas, MD   100 mg at 02/12/15 2221  . enoxaparin (LOVENOX) injection 40 mg  40 mg Subcutaneous Q24H Enid Baas, MD   40 mg at 02/12/15 1640  . escitalopram (LEXAPRO) tablet 20 mg  20 mg Oral Daily Enid Baas, MD   20 mg at 02/12/15 1028  . feeding supplement (ENSURE ENLIVE) (ENSURE ENLIVE) liquid 237 mL  237 mL Oral BID WC Alford Highland, MD   237 mL at 02/12/15 1643  .  HYDROcodone-acetaminophen (NORCO/VICODIN) 5-325 MG per tablet 2 tablet  2 tablet Oral Q6H PRN Ihor AustinPavan Pyreddy, MD   2 tablet at 02/09/15 0930  . hydrocortisone (ANUSOL-HC) 2.5 % rectal cream 1 application  1 application Rectal TID Enid Baasadhika Kalisetti, MD   1 application at 02/12/15 2221  . levofloxacin (LEVAQUIN) IVPB 750 mg  750 mg Intravenous Q24H Alford Highlandichard Wieting, MD   750 mg at 02/12/15 1028  . [START ON 02/14/2015] levofloxacin (LEVAQUIN) tablet 750 mg  750 mg Oral Daily Alford Highlandichard Wieting, MD      . magnesium citrate solution 1 Bottle  1 Bottle Oral Once Kennedy BuckerMichael Menz, MD   1 Bottle at 02/10/15 1515  . magnesium hydroxide (MILK OF MAGNESIA) suspension 30 mL  30 mL Oral Daily PRN Enid Baasadhika Kalisetti, MD      . memantine (NAMENDA XR) 24 hr capsule 28 mg  28 mg  Oral QPM Enid Baasadhika Kalisetti, MD   28 mg at 02/12/15 1639  . menthol-cetylpyridinium (CEPACOL) lozenge 3 mg  1 lozenge Oral PRN Kennedy BuckerMichael Menz, MD       Or  . phenol [facility](CHLORASEPTIC) mouth spray 1 spray  1 spray Mouth/Throat PRN Kennedy BuckerMichael Menz, MD      . mirtazapine (REMERON) tablet 15 mg  15 mg Oral QPM Enid Baasadhika Kalisetti, MD   15 mg at 02/12/15 2221  . morphine 2 MG/ML injection 2 mg  2 mg Intravenous Q1H PRN Kennedy BuckerMichael Menz, MD   2 mg at 02/10/15 1147  . ondansetron (ZOFRAN) tablet 4 mg  4 mg Oral Q6H PRN Enid Baasadhika Kalisetti, MD       Or  . ondansetron (ZOFRAN) injection 4 mg  4 mg Intravenous Q6H PRN Enid Baasadhika Kalisetti, MD      . oxyCODONE (Oxy IR/ROXICODONE) immediate release tablet 5-10 mg  5-10 mg Oral Q3H PRN Kennedy BuckerMichael Menz, MD   5 mg at 02/10/15 0954  . polyethylene glycol powder (GLYCOLAX/MIRALAX) container 255 g  1 Container Oral Once Enid Baasadhika Kalisetti, MD   255 g at 02/07/15 2002  . risperiDONE (RISPERDAL) tablet 1 mg  1 mg Oral TID Enid Baasadhika Kalisetti, MD   1 mg at 02/12/15 2221  . rivastigmine (EXELON) 4.6 mg/24hr 4.6 mg  4.6 mg Transdermal Daily Enid Baasadhika Kalisetti, MD   4.6 mg at 02/12/15 1029     Discharge Medications: Please see discharge summary for a list of discharge medications.  Relevant Imaging Results:  Relevant Lab Results:   Additional Information  (ZO:[id number])  Soundra PilonMoore, Deborah H, LCSW

## 2015-02-13 NOTE — Clinical Social Work Placement (Signed)
   CLINICAL SOCIAL WORK PLACEMENT  NOTE  Date:  02/13/2015  Patient Details  Name: Maurice Clayton MRN: [id number] Date of Birth: 04/22/1960  Clinical Social Work is seeking post-discharge placement for this patient at the Skilled  Nursing Facility level of care (*CSW will initial, date and re-position this form in  chart as items are completed):  No   Patient/family provided with [facility] Clinical Social Work Department's list of facilities offering this level of care within the geographic area requested by the patient (or if unable, by the patient's family).  Yes   Patient/family informed of their freedom to choose among providers that offer the needed level of care, that participate in Medicare, Medicaid or managed care program needed by the patient, have an available bed and are willing to accept the patient.  Yes   Patient/family informed of [facility] ownership interest in [facility] and [facility], as well as of the fact that they are under no obligation to receive care at these facilities.  PASRR submitted to EDS on 02/07/15     PASRR number received on 02/12/15     Existing PASRR number confirmed on       FL2 transmitted to all facilities in geographic area requested by pt/family on 02/07/15     FL2 transmitted to all facilities within larger geographic area on 02/08/15     Patient informed that his/her managed care company has contracts with or will negotiate with certain facilities, including the following:        Yes   Patient/family informed of bed offers received.  Patient chooses bed at  [facility] [place])     Physician recommends and patient chooses bed at      Patient to be transferred to  [facility] [place]) on 02/13/15.  Patient to be transferred to facility by  (EMS)     Patient family notified on 02/13/15 of transfer.  Name of family member notified:   Merlyn Albert([name])     PHYSICIAN Please sign FL2     Additional Comment:     _______________________________________________ Soundra PilonMoore, Katera Rybka H, LCSW 02/13/2015, 8:55 AM

## 2015-02-13 NOTE — Progress Notes (Addendum)
  Subjective: 4 Days Post-Op Procedure(s) (LRB): TOTAL HIP ARTHROPLASTY ANTERIOR APPROACH (Right) Patient reports pain as mild.   Patient is well, and has had no acute complaints or problems. The patient has minimal communication this morning, as he was resting. He was able to wake up with minimal answers to questions. Plan is to go Rehab after hospital stay. Negative for chest pain and shortness of breath Fever: no Gastrointestinal: Negative for nausea and vomiting. The patient has had a bowel movement.  Objective: Vital signs in last 24 hours: Temp:  [98.3 F (36.8 C)-99.6 F (37.6 C)] 98.3 F (36.8 C) (01/13 0433) Pulse Rate:  [90-100] 100 (01/13 0433) Resp:  [16-18] 18 (01/13 0433) BP: (107-117)/(53-74) 107/74 mmHg (01/13 0433) SpO2:  [95 %-97 %] 96 % (01/13 0433) Weight:  [52.753 kg (116 lb 4.8 oz)] 52.753 kg (116 lb 4.8 oz) (01/13 0616)  Intake/Output from previous day:  Intake/Output Summary (Last 24 hours) at 02/13/15 0736 Last data filed at 02/12/15 1800  Gross per 24 hour  Intake    480 ml  Output      0 ml  Net    480 ml    Intake/Output this shift:    Labs:  Recent Labs  02/12/15 0602  HGB 11.8*    Recent Labs  02/12/15 0602  WBC 5.3  RBC 3.84*  HCT 34.8*  PLT 151    Recent Labs  02/12/15 0602  NA 137  K 3.4*  CL 104  CO2 28  BUN 10  CREATININE 0.51*  GLUCOSE 110*  CALCIUM 7.8*   No results for input(s): LABPT, INR in the last 72 hours.   EXAM General - Patient is Alert and Oriented Extremity - Sensation intact distally Intact pulses distally Dorsiflexion/Plantar flexion intact Compartment soft Dressing/Incision - clean, dry, no drainage Motor Function - intact, moving foot and toes well on exam. Bed to chair ambulation with physical therapy  Past Medical History  Diagnosis Date  . Cerebral palsy (HCC)     bed bound status  . Dementia   . HTN (hypertension)   . Depression   . Vitamin B 12 deficiency   . Hyperthyroidism      Assessment/Plan: 4 Days Post-Op Procedure(s) (LRB): TOTAL HIP ARTHROPLASTY ANTERIOR APPROACH (Right) Active Problems:   Abdominal pain   Pressure ulcer  Estimated body mass index is 18.21 kg/(m^2) as calculated from the following:   Height as of this encounter: 5\' 7"  (1.702 m).   Weight as of this encounter: 52.753 kg (116 lb 4.8 oz). Up with therapy Discharge to SNF when cleared medically   Staples can be removed in 2 weeks at rehab facility. Follow-up with [facility] Rosita [facility]Menz [facility] in 6 weeks for x-rays of the right hip.  DVT Prophylaxis - Lovenox, Foot Pumps and TED hose Weight-Bearing as tolerated to right leg  Dedra Skeensodd Mundy, PA-C Orthopaedic Surgery 02/13/2015, 7:36 AM

## 2015-02-13 NOTE — Progress Notes (Signed)
Clinical Social Worker informed by Alford Highlandichard Wieting, MD that patient is medically ready to discharge to SNF, Patient and  Guardians are in a agreement with plan.  [facility] bed [facility] ready. Provided patient's room number 156-B and number to call for report [phone] . All discharge information faxed to  Facility. Rx's added to discharge packet.   RN will call report and patient will [facility] via [facility].  Sammuel Hineseborah Moore. LCSWA Clinical Social Work Department [phone] 9:34 AM

## 2015-02-13 NOTE — Progress Notes (Signed)
During a diaper change, NT and RN noticed patient was bleeding slightly at the tip of his penis. MD notified. No orders received.

## 2015-02-14 NOTE — Progress Notes (Signed)
[facility]

## 2015-02-17 ENCOUNTER — Encounter: Payer: Self-pay | Admitting: Internal Medicine

## 2015-02-17 ENCOUNTER — Non-Acute Institutional Stay (SKILLED_NURSING_FACILITY): Payer: Medicare Other | Admitting: Internal Medicine

## 2015-02-17 DIAGNOSIS — F329 Major depressive disorder, single episode, unspecified: Secondary | ICD-10-CM

## 2015-02-17 DIAGNOSIS — I10 Essential (primary) hypertension: Secondary | ICD-10-CM

## 2015-02-17 DIAGNOSIS — K5909 Other constipation: Secondary | ICD-10-CM

## 2015-02-17 DIAGNOSIS — J189 Pneumonia, unspecified organism: Secondary | ICD-10-CM

## 2015-02-17 DIAGNOSIS — E538 Deficiency of other specified B group vitamins: Secondary | ICD-10-CM

## 2015-02-17 DIAGNOSIS — F0390 Unspecified dementia without behavioral disturbance: Secondary | ICD-10-CM

## 2015-02-17 DIAGNOSIS — G894 Chronic pain syndrome: Secondary | ICD-10-CM

## 2015-02-17 DIAGNOSIS — L8990 Pressure ulcer of unspecified site, unspecified stage: Secondary | ICD-10-CM

## 2015-02-17 DIAGNOSIS — Z8639 Personal history of other endocrine, nutritional and metabolic disease: Secondary | ICD-10-CM

## 2015-02-17 DIAGNOSIS — G809 Cerebral palsy, unspecified: Secondary | ICD-10-CM

## 2015-02-17 DIAGNOSIS — S72141A Displaced intertrochanteric fracture of right femur, initial encounter for closed fracture: Secondary | ICD-10-CM | POA: Diagnosis not present

## 2015-02-17 DIAGNOSIS — N2889 Other specified disorders of kidney and ureter: Secondary | ICD-10-CM

## 2015-02-17 DIAGNOSIS — F32A Depression, unspecified: Secondary | ICD-10-CM

## 2015-02-17 DIAGNOSIS — Z96641 Presence of right artificial hip joint: Secondary | ICD-10-CM | POA: Diagnosis not present

## 2015-02-17 DIAGNOSIS — J9588 Other intraoperative complications of respiratory system, not elsewhere classified: Secondary | ICD-10-CM

## 2015-02-17 DIAGNOSIS — J9589 Other postprocedural complications and disorders of respiratory system, not elsewhere classified: Secondary | ICD-10-CM

## 2015-02-17 NOTE — Progress Notes (Signed)
Patient ID: Maurice Clayton, male   DOB: 12/15/1960, 55 y.o.   MRN: [id number]    HISTORY AND PHYSICAL   DATE: 02/17/15  Location:  [place] of Service: SNF [phone])   Extended Emergency Contact Information Primary Emergency Contact: [place] Phone: [phone] Relation: Other Secondary Emergency Contact: Delane Ginger Address: [place], [place] [address] Johnnette Litter of [place] Phone: [phone] Relation: Other  Advanced Directive information  DNR  Chief Complaint  Patient presents with  . New Admit To SNF    HPI:  55 yo male seen today as a new admission into SNF following hospital stay at [facility] for closed right hip fx, weakness, fecal impaction, left renal mass, pressure ulcer, post op pneumonia, hx cerebral palsy, hyperthyroid, HTN, dementia, depression, b12 deficiency. Placed on lovenox for dvt prophylaxis. rx levaquin for pneumonia. Constipation tx aggressively, pain controlled with Oxy IR  He has no c/o. He is a poor historian due to dementia. Hx obtained from chart. No nursing issues. No falls.   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 Mood stable on lexapro and remeron. Has risperdal for psychosis. He gets nutritional supplements per facility protocol. Albumin 3.5  HTN - BP diet controlled.  He takes ASA daily  Hyperthyroid - stable. He does not take any meds. TSH 1.685 with free T4 of 0.64 in 08/2014  b12 deficiency - stable. hgb 11.8 at d/c  Left kidney mass - followed by urology. Cr 0.51 at d/c  Past Medical History  Diagnosis Date  . Cerebral palsy ([place])     bed bound status  . Dementia   . HTN (hypertension)   . Depression   . Vitamin B 12 deficiency   . Hyperthyroidism     Past Surgical History  Procedure Laterality Date  . Dental procedure    . Foot surgeries      bilateral  for cerebral palsy  . Total hip arthroplasty Right 02/09/2015    Procedure: TOTAL HIP ARTHROPLASTY ANTERIOR APPROACH;  Surgeon: Hessie Knows, MD;  Location: [facility] ORS;  Service: Orthopedics;  Laterality: Right;    No care team member to display  Social History   Social History  . Marital Status: Single    Spouse Name: N/A  . Number of Children: N/A  . Years of Education: N/A   Occupational History  . Not on file.   Social History Main Topics  . Smoking status: Never Smoker   . Smokeless tobacco: Not on file  . Alcohol Use: No  . Drug Use: No  . Sexual Activity: No   Other Topics Concern  . Not on file   Social History Narrative   [facility]. Bedbound now and minimal verbal communication.     reports that he has never smoked. He does not have any smokeless tobacco history on file. He reports that he does not drink alcohol or use illicit drugs.  Family History  Problem Relation Age of Onset  . Dementia Mother   . CAD Brother   . Aortic aneurysm Brother   . Aortic aneurysm Father    No family status information on file.     There is no immunization history on file for this patient.  No Known Allergies  Medications: Patient's Medications  New Prescriptions   No medications on file  Previous Medications   ACETAMINOPHEN (TYLENOL) 325 MG TABLET    Take 325 mg by mouth every 6 (six) hours as needed for mild pain, fever or headache.   ASPIRIN EC 81 MG TABLET    Take 81 mg by mouth every evening.   DESMOPRESSIN (DDAVP) 0.1 MG TABLET    Take 0.25 mg by mouth at bedtime.    DOCUSATE SODIUM (COLACE) 100 MG CAPSULE    Take 1 capsule (100 mg total) by mouth 2 (two) times daily.   ENOXAPARIN (LOVENOX) 40 MG/0.4ML INJECTION    Inject 0.4 mLs (40 mg total) into the skin daily.   ESCITALOPRAM (LEXAPRO) 20 MG TABLET    Take 20 mg by mouth daily.   FEEDING SUPPLEMENT, ENSURE ENLIVE, (ENSURE ENLIVE) LIQD    Take 237 mLs by mouth 2 (two) times daily with a meal.    HYDROCORTISONE (ANUSOL-HC) 2.5 % RECTAL CREAM    Place 1 application rectally 3 (three) times daily.   LEVOFLOXACIN (LEVAQUIN) 750 MG TABLET    One a day for five days then stop   MEMANTINE (NAMENDA XR) 28 MG CP24 24 HR CAPSULE    Take 28 mg by mouth every evening.   MIRTAZAPINE (REMERON) 15 MG TABLET    Take 15 mg by mouth every evening.    OXYCODONE (OXY IR/ROXICODONE) 5 MG IMMEDIATE RELEASE TABLET    Take 1-2 tablets (5-10 mg total) by mouth every 4 (four) hours as needed for breakthrough pain.   RISPERIDONE (RISPERDAL) 1 MG TABLET    Take 1 mg by mouth 3 (three) times daily.   RIVASTIGMINE (EXELON) 4.6 MG/24HR    Place 4.6 mg onto the skin daily.  Modified Medications   No medications on file  Discontinued Medications   No medications on file    Review of Systems  Unable to perform ROS: Dementia    Filed Vitals:   02/17/15 1130  BP: 118/68  Pulse: 76  Temp: 97.5 F (36.4 C)  Weight: 104 lb 3.2 oz (47.265 kg)  SpO2: 97%   Body mass index is 16.32 kg/(m^2).  Physical Exam  Constitutional:  Frail appearing with temporal wasting. Lying in bed on left side in NAD  HENT:  Mouth/Throat: Oropharynx is clear and moist.  Eyes: Pupils are equal, round, and reactive to light. No scleral icterus.  Neck: Neck supple. Carotid bruit is not present. No thyromegaly present.  Cardiovascular: Regular rhythm, normal heart sounds and intact distal pulses.  Tachycardia present.  Exam reveals no gallop and no friction rub.   No murmur heard. Pulse bounding. No LE edema b/l. No calf TTP  Pulmonary/Chest: Effort normal and breath sounds normal. He has no wheezes. He has no rales. He exhibits no tenderness.  Abdominal: Soft. Bowel sounds are normal. He exhibits no distension, no abdominal bruit, no pulsatile midline mass and no mass. There is no tenderness. There is no rebound and no guarding.  Musculoskeletal: He exhibits edema (right greater trochanteric area) and tenderness.  Contractures of  extremities present. Right lateral thigh bandage c/d/i.   Lymphadenopathy:    He has no cervical adenopathy.  Neurological: He is alert.  Skin: Skin is warm and dry. No rash noted.  Right lateral malleolus bandage c/d/i  Psychiatric: He has a normal mood and affect. His behavior is normal.     Labs reviewed: Admission on 02/06/2015, Discharged on 02/13/2015  Component Date Value Ref Range Status  . WBC 02/06/2015 6.3  3.8 - 10.6 K/uL Final  .  RBC 02/06/2015 5.08  4.40 - 5.90 MIL/uL Final  . Hemoglobin 02/06/2015 15.2  13.0 - 18.0 g/dL Final  . HCT 02/06/2015 46.1  40.0 - 52.0 % Final  . MCV 02/06/2015 90.7  80.0 - 100.0 fL Final  . MCH 02/06/2015 30.0  26.0 - 34.0 pg Final  . MCHC 02/06/2015 33.1  32.0 - 36.0 g/dL Final  . RDW 02/06/2015 13.1  11.5 - 14.5 % Final  . Platelets 02/06/2015 281  150 - 440 K/uL Final  . Neutrophils Relative % 02/06/2015 67   Final  . Neutro Abs 02/06/2015 4.3  1.4 - 6.5 K/uL Final  . Lymphocytes Relative 02/06/2015 23   Final  . Lymphs Abs 02/06/2015 1.4  1.0 - 3.6 K/uL Final  . Monocytes Relative 02/06/2015 7   Final  . Monocytes Absolute 02/06/2015 0.4  0.2 - 1.0 K/uL Final  . Eosinophils Relative 02/06/2015 2   Final  . Eosinophils Absolute 02/06/2015 0.1  0 - 0.7 K/uL Final  . Basophils Relative 02/06/2015 1   Final  . Basophils Absolute 02/06/2015 0.0  0 - 0.1 K/uL Final  . Sodium 02/06/2015 139  135 - 145 mmol/L Final  . Potassium 02/06/2015 3.9  3.5 - 5.1 mmol/L Final  . Chloride 02/06/2015 103  101 - 111 mmol/L Final  . CO2 02/06/2015 29  22 - 32 mmol/L Final  . Glucose, Bld 02/06/2015 96  65 - 99 mg/dL Final  . BUN 02/06/2015 9  6 - 20 mg/dL Final  . Creatinine, Ser 02/06/2015 0.55* 0.61 - 1.24 mg/dL Final  . Calcium 02/06/2015 8.8* 8.9 - 10.3 mg/dL Final  . Total Protein 02/06/2015 6.2* 6.5 - 8.1 g/dL Final  . Albumin 02/06/2015 3.5  3.5 - 5.0 g/dL Final  . AST 02/06/2015 11* 15 - 41 U/L Final  . ALT 02/06/2015 12* 17 - 63 U/L Final    . Alkaline Phosphatase 02/06/2015 101  38 - 126 U/L Final  . Total Bilirubin 02/06/2015 0.4  0.3 - 1.2 mg/dL Final  . GFR calc non Af Amer 02/06/2015 >60  >60 mL/min Final  . GFR calc Af Amer 02/06/2015 >60  >60 mL/min Final   Comment: (NOTE) The eGFR has been calculated using the CKD EPI equation. This calculation has not been validated in all clinical situations. eGFR's persistently <60 mL/min signify possible Chronic Kidney Disease.   . Anion gap 02/06/2015 7  5 - 15 Final  . Troponin I 02/06/2015 <0.03  <0.031 ng/mL Final   Comment:        NO INDICATION OF MYOCARDIAL INJURY.   . Color, Urine 02/06/2015 YELLOW* YELLOW Final  . APPearance 02/06/2015 CLEAR* CLEAR Final  . Glucose, UA 02/06/2015 NEGATIVE  NEGATIVE mg/dL Final  . Bilirubin Urine 02/06/2015 NEGATIVE  NEGATIVE Final  . Ketones, ur 02/06/2015 NEGATIVE  NEGATIVE mg/dL Final  . Specific Gravity, Urine 02/06/2015 1.011  1.005 - 1.030 Final  . Hgb urine dipstick 02/06/2015 NEGATIVE  NEGATIVE Final  . pH 02/06/2015 6.0  5.0 - 8.0 Final  . Protein, ur 02/06/2015 NEGATIVE  NEGATIVE mg/dL Final  . Nitrite 02/06/2015 NEGATIVE  NEGATIVE Final  . Leukocytes, UA 02/06/2015 NEGATIVE  NEGATIVE Final  . RBC / HPF 02/06/2015 0-5  0 - 5 RBC/hpf Final  . WBC, UA 02/06/2015 0-5  0 - 5 WBC/hpf Final  . Bacteria, UA 02/06/2015 NONE SEEN  NONE SEEN Final  . Squamous Epithelial / LPF 02/06/2015 NONE SEEN  NONE SEEN Final  . Mucous 02/06/2015  PRESENT   Final  . Sed Rate 02/06/2015 2  0 - 20 mm/hr Final  . Sodium 02/07/2015 141  135 - 145 mmol/L Final  . Potassium 02/07/2015 3.9  3.5 - 5.1 mmol/L Final  . Chloride 02/07/2015 106  101 - 111 mmol/L Final  . CO2 02/07/2015 31  22 - 32 mmol/L Final  . Glucose, Bld 02/07/2015 100* 65 - 99 mg/dL Final  . BUN 02/07/2015 10  6 - 20 mg/dL Final  . Creatinine, Ser 02/07/2015 0.83  0.61 - 1.24 mg/dL Final  . Calcium 02/07/2015 8.3* 8.9 - 10.3 mg/dL Final  . GFR calc non Af Amer 02/07/2015 >60   >60 mL/min Final  . GFR calc Af Amer 02/07/2015 >60  >60 mL/min Final   Comment: (NOTE) The eGFR has been calculated using the CKD EPI equation. This calculation has not been validated in all clinical situations. eGFR's persistently <60 mL/min signify possible Chronic Kidney Disease.   . Anion gap 02/07/2015 4* 5 - 15 Final  . WBC 02/07/2015 7.6  3.8 - 10.6 K/uL Final  . RBC 02/07/2015 4.68  4.40 - 5.90 MIL/uL Final  . Hemoglobin 02/07/2015 14.8  13.0 - 18.0 g/dL Final  . HCT 02/07/2015 44.0  40.0 - 52.0 % Final  . MCV 02/07/2015 94.0  80.0 - 100.0 fL Final  . MCH 02/07/2015 31.7  26.0 - 34.0 pg Final  . MCHC 02/07/2015 33.7  32.0 - 36.0 g/dL Final  . RDW 02/07/2015 13.1  11.5 - 14.5 % Final  . Platelets 02/07/2015 259  150 - 440 K/uL Final  . MRSA by PCR 02/08/2015 NEGATIVE  NEGATIVE Final   Comment:        The GeneXpert MRSA Assay (FDA approved for NASAL specimens only), is one component of a comprehensive MRSA colonization surveillance program. It is not intended to diagnose MRSA infection nor to guide or monitor treatment for MRSA infections.   . Hemoglobin 02/10/2015 11.8* 13.0 - 18.0 g/dL Final  . SURGICAL PATHOLOGY 02/09/2015    Final                   Value:Surgical Pathology CASE: [id number] PATIENT: Brunilda Payor Surgical Pathology Report     SPECIMEN SUBMITTED: A. Femoral head, right  CLINICAL HISTORY:   PRE-OPERATIVE DIAGNOSIS: Chronic hip fracture and arthritis right  POST-OPERATIVE DIAGNOSIS:      DIAGNOSIS: A. FEMORAL HEAD, RIGHT; TOTAL HIP ARTHROPLASTY: - SEVERE DEGENERATIVE CHANGES CONSISTENT WITH CLINICAL HISTORY OF CHRONIC FRACTURE.   GROSS DESCRIPTION: A. Labeled: Right femoral head  Size of specimen:      Head - 4.2 x 3.2 x 2.2 cm      Additional bone - 4 x 3.5 x 1.5 cm  Articular surface: Pink red eroded and ragged  Cut surface: The resected margin is hemorrhagic but there are no masses or cysts grossly noted  Other  findings: None  Block summary: 1-2 - representative section(s), post decalcification  Final Diagnosis performed by Bryan Lemma, MD.  Electronically signed 02/12/2015 11:29:17AM    The electronic signature indicates that the named Attending Pathologist has evaluate                         d the specimen  Technical component performed at [facility], [address], [place], [place] [address] Lab: [phone] Dir: Darrick Penna. Evette Doffing, MD  Professional component performed at [facility], [facility], [place], [place], [place] [address] Lab: [phone] Dir:  Dellia Nims Rubinas, MD    . WBC 02/09/2015 8.2  3.8 - 10.6 K/uL Final  . RBC 02/09/2015 4.21* 4.40 - 5.90 MIL/uL Final  . Hemoglobin 02/09/2015 12.8* 13.0 - 18.0 g/dL Final  . HCT 02/09/2015 37.9* 40.0 - 52.0 % Final  . MCV 02/09/2015 90.2  80.0 - 100.0 fL Final  . MCH 02/09/2015 30.3  26.0 - 34.0 pg Final  . MCHC 02/09/2015 33.6  32.0 - 36.0 g/dL Final  . RDW 02/09/2015 13.1  11.5 - 14.5 % Final  . Platelets 02/09/2015 170  150 - 440 K/uL Final  . Creatinine, Ser 02/09/2015 0.55* 0.61 - 1.24 mg/dL Final  . GFR calc non Af Amer 02/09/2015 >60  >60 mL/min Final  . GFR calc Af Amer 02/09/2015 >60  >60 mL/min Final   Comment: (NOTE) The eGFR has been calculated using the CKD EPI equation. This calculation has not been validated in all clinical situations. eGFR's persistently <60 mL/min signify possible Chronic Kidney Disease.   . Sodium 02/10/2015 135  135 - 145 mmol/L Final  . Potassium 02/10/2015 3.6  3.5 - 5.1 mmol/L Final  . Chloride 02/10/2015 102  101 - 111 mmol/L Final  . CO2 02/10/2015 25  22 - 32 mmol/L Final  . Glucose, Bld 02/10/2015 89  65 - 99 mg/dL Final  . BUN 02/10/2015 10  6 - 20 mg/dL Final  . Creatinine, Ser 02/10/2015 0.45* 0.61 - 1.24 mg/dL Final  . Calcium 02/10/2015 8.0* 8.9 - 10.3 mg/dL Final  . GFR calc non Af Amer 02/10/2015 >60  >60 mL/min Final  . GFR calc Af Amer 02/10/2015 >60   >60 mL/min Final   Comment: (NOTE) The eGFR has been calculated using the CKD EPI equation. This calculation has not been validated in all clinical situations. eGFR's persistently <60 mL/min signify possible Chronic Kidney Disease.   . Anion gap 02/10/2015 8  5 - 15 Final  . WBC 02/12/2015 5.3  3.8 - 10.6 K/uL Final  . RBC 02/12/2015 3.84* 4.40 - 5.90 MIL/uL Final  . Hemoglobin 02/12/2015 11.8* 13.0 - 18.0 g/dL Final  . HCT 02/12/2015 34.8* 40.0 - 52.0 % Final  . MCV 02/12/2015 90.5  80.0 - 100.0 fL Final  . MCH 02/12/2015 30.6  26.0 - 34.0 pg Final  . MCHC 02/12/2015 33.8  32.0 - 36.0 g/dL Final  . RDW 02/12/2015 13.0  11.5 - 14.5 % Final  . Platelets 02/12/2015 151  150 - 440 K/uL Final  . Sodium 02/12/2015 137  135 - 145 mmol/L Final  . Potassium 02/12/2015 3.4* 3.5 - 5.1 mmol/L Final  . Chloride 02/12/2015 104  101 - 111 mmol/L Final  . CO2 02/12/2015 28  22 - 32 mmol/L Final  . Glucose, Bld 02/12/2015 110* 65 - 99 mg/dL Final  . BUN 02/12/2015 10  6 - 20 mg/dL Final  . Creatinine, Ser 02/12/2015 0.51* 0.61 - 1.24 mg/dL Final  . Calcium 02/12/2015 7.8* 8.9 - 10.3 mg/dL Final  . GFR calc non Af Amer 02/12/2015 >60  >60 mL/min Final  . GFR calc Af Amer 02/12/2015 >60  >60 mL/min Final   Comment: (NOTE) The eGFR has been calculated using the CKD EPI equation. This calculation has not been validated in all clinical situations. eGFR's persistently <60 mL/min signify possible Chronic Kidney Disease.   . Anion gap 02/12/2015 5  5 - 15 Final  Admission on 01/06/2015, Discharged on 01/06/2015  Component Date Value Ref Range Status  . Prothrombin  Time 01/06/2015 13.7  11.4 - 15.0 seconds Final  . INR 01/06/2015 1.03   Final  . aPTT 01/06/2015 30  24 - 36 seconds Final  . WBC 01/06/2015 7.5  3.8 - 10.6 K/uL Final  . RBC 01/06/2015 5.16  4.40 - 5.90 MIL/uL Final  . Hemoglobin 01/06/2015 15.7  13.0 - 18.0 g/dL Final  . HCT 01/06/2015 47.2  40.0 - 52.0 % Final  . MCV 01/06/2015 91.5   80.0 - 100.0 fL Final  . MCH 01/06/2015 30.5  26.0 - 34.0 pg Final  . MCHC 01/06/2015 33.3  32.0 - 36.0 g/dL Final  . RDW 01/06/2015 12.9  11.5 - 14.5 % Final  . Platelets 01/06/2015 195  150 - 440 K/uL Final  . Neutrophils Relative % 01/06/2015 60   Final  . Neutro Abs 01/06/2015 4.5  1.4 - 6.5 K/uL Final  . Lymphocytes Relative 01/06/2015 30   Final  . Lymphs Abs 01/06/2015 2.2  1.0 - 3.6 K/uL Final  . Monocytes Relative 01/06/2015 5   Final  . Monocytes Absolute 01/06/2015 0.4  0.2 - 1.0 K/uL Final  . Eosinophils Relative 01/06/2015 4   Final  . Eosinophils Absolute 01/06/2015 0.3  0 - 0.7 K/uL Final  . Basophils Relative 01/06/2015 1   Final  . Basophils Absolute 01/06/2015 0.1  0 - 0.1 K/uL Final  . Sodium 01/06/2015 142  135 - 145 mmol/L Final  . Potassium 01/06/2015 4.0  3.5 - 5.1 mmol/L Final  . Chloride 01/06/2015 109  101 - 111 mmol/L Final  . CO2 01/06/2015 28  22 - 32 mmol/L Final  . Glucose, Bld 01/06/2015 102* 65 - 99 mg/dL Final  . BUN 01/06/2015 12  6 - 20 mg/dL Final  . Creatinine, Ser 01/06/2015 0.58* 0.61 - 1.24 mg/dL Final  . Calcium 01/06/2015 8.8* 8.9 - 10.3 mg/dL Final  . Total Protein 01/06/2015 6.2* 6.5 - 8.1 g/dL Final  . Albumin 01/06/2015 3.9  3.5 - 5.0 g/dL Final  . AST 01/06/2015 12* 15 - 41 U/L Final  . ALT 01/06/2015 8* 17 - 63 U/L Final  . Alkaline Phosphatase 01/06/2015 61  38 - 126 U/L Final  . Total Bilirubin 01/06/2015 0.4  0.3 - 1.2 mg/dL Final  . GFR calc non Af Amer 01/06/2015 >60  >60 mL/min Final  . GFR calc Af Amer 01/06/2015 >60  >60 mL/min Final   Comment: (NOTE) The eGFR has been calculated using the CKD EPI equation. This calculation has not been validated in all clinical situations. eGFR's persistently <60 mL/min signify possible Chronic Kidney Disease.   . Anion gap 01/06/2015 5  5 - 15 Final  . Troponin I 01/06/2015 <0.03  <0.031 ng/mL Final   Comment:        NO INDICATION OF MYOCARDIAL INJURY.   . Glucose-Capillary  01/06/2015 86  65 - 99 mg/dL Final  . Color, Urine 01/06/2015 YELLOW* YELLOW Final  . APPearance 01/06/2015 CLEAR* CLEAR Final  . Glucose, UA 01/06/2015 NEGATIVE  NEGATIVE mg/dL Final  . Bilirubin Urine 01/06/2015 NEGATIVE  NEGATIVE Final  . Ketones, ur 01/06/2015 NEGATIVE  NEGATIVE mg/dL Final  . Specific Gravity, Urine 01/06/2015 1.018  1.005 - 1.030 Final  . Hgb urine dipstick 01/06/2015 NEGATIVE  NEGATIVE Final  . pH 01/06/2015 6.0  5.0 - 8.0 Final  . Protein, ur 01/06/2015 NEGATIVE  NEGATIVE mg/dL Final  . Nitrite 01/06/2015 NEGATIVE  NEGATIVE Final  . Leukocytes, UA 01/06/2015 NEGATIVE  NEGATIVE Final  .  RBC / HPF 01/06/2015 0-5  0 - 5 RBC/hpf Final  . WBC, UA 01/06/2015 0-5  0 - 5 WBC/hpf Final  . Bacteria, UA 01/06/2015 RARE* NONE SEEN Final  . Squamous Epithelial / LPF 01/06/2015 NONE SEEN  NONE SEEN Final  . Mucous 01/06/2015 PRESENT   Final    Dg Chest 1 View  02/11/2015  CLINICAL DATA:  Patient has fever post admittance. Patient has a HX of HTN, dementia, and cerebral palsy. Best images possible for patient condition. EXAM: CHEST 1 VIEW COMPARISON:  02/06/2015 FINDINGS: Shallow lung inflation. Heart size is normal. There is patchy density in both medial lower lobes. Findings are consistent with infectious infiltrate. No pulmonary edema. IMPRESSION: Bilateral lower lobe infiltrates. Electronically Signed   By: Nolon Nations M.D.   On: 02/11/2015 17:34   Dg Tibia/fibula Right  02/06/2015  CLINICAL DATA:  Increased lethargy. Possible right lower leg injury. Initial encounter. EXAM: RIGHT TIBIA AND FIBULA - 2 VIEW COMPARISON:  None. FINDINGS: No acute bony or joint abnormality is seen. Talonavicular degenerative disease is identified. Musculature of the lower leg appears atrophic. IMPRESSION: No acute abnormality. Atrophic musculature right lower leg. Talonavicular degenerative change. Electronically Signed   By: Inge Rise M.D.   On: 02/06/2015 15:49   Dg Abd 1  View  02/08/2015  CLINICAL DATA:  Constipation EXAM: ABDOMEN - 1 VIEW COMPARISON:  None. FINDINGS: There is a large stool ball in the rectal vault. Fairly large amount of stool is also seen throughout the colon. No dilated large or small bowel loops seen. Detailed characterization is otherwise limited due to patient motion artifact. No obvious evidence of free intraperitoneal air, soft tissue mass or abnormal fluid collection. There is an old right femoral neck fracture, as also described on the recent CT of 02/06/2015. IMPRESSION: Findings are compatible with the given history of constipation. Large stool ball distending the rectal vault, measuring at least 11 x 9 cm. Large amount of stool throughout the colon. Electronically Signed   By: Franki Cabot M.D.   On: 02/08/2015 10:36   Ct Head Wo Contrast  02/06/2015  CLINICAL DATA:  Patient is alert and nonverbal. Seems more lethargic than usual. EXAM: CT HEAD WITHOUT CONTRAST TECHNIQUE: Contiguous axial images were obtained from the base of the skull through the vertex without intravenous contrast. COMPARISON:  CT brain 01/06/2015. FINDINGS: Limited exam due to motion artifact. Ventricles and sulci are stable in appearance with mega cisterna magna re- demonstrated. No evidence for acute cortically based infarct, intracranial hemorrhage, mass lesion or mass-effect. Chronic calcifications are re-demonstrated within the parietal lobes bilaterally. Orbits are unremarkable. Mucosal thickening involving the left greater than right maxillary sinuses. Mastoid air cells are unremarkable. Calvarium is intact. IMPRESSION: No acute intracranial process. Mega cisterna magna. Mild cortical volume loss. Electronically Signed   By: Lovey Newcomer M.D.   On: 02/06/2015 14:59   Ct Abdomen Pelvis W Contrast  02/06/2015  CLINICAL DATA:  Decreased appetite.  Altered mental status. EXAM: CT ABDOMEN AND PELVIS WITH CONTRAST TECHNIQUE: Multidetector CT imaging of the abdomen and pelvis was  performed using the standard protocol following bolus administration of intravenous contrast. CONTRAST:  115m OMNIPAQUE IOHEXOL 300 MG/ML  SOLN COMPARISON:  None. FINDINGS: Lower chest: Lung bases are clear. Heart size is normal. Tiny nonspecific pericardial effusion. Hepatobiliary: The liver parenchyma is normal. There is suggestion of faint stones in the gallbladder. Minimal prominence of the intrahepatic bile ducts. Is the patient's bilirubin normal? Pancreas: Normal. Spleen: Normal.  Adrenals/Urinary Tract: There is a subtle 12 mm mass on the posterior aspect of the lower pole the left kidney worrisome for malignancy. 8 mm cyst on the upper pole of the left kidney. Adrenal glands are normal. Right kidney is normal. No hydronephrosis. Bladder is normal. Stomach/Bowel: The patient has a massive fecal impaction measuring approximately 10 cm in diameter with marked distention of the rectum. There is extensive stool throughout the entire colon. No small bowel dilatation. Vascular/Lymphatic: Calcification in the distal abdominal aorta and iliac arteries. No adenopathy. Reproductive: Normal. Other: No free air or free fluid. 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 IMPRESSION: 1. Massive fecal impaction with extensive stool throughout the colon. 2. 12 mm mass on the lower pole of the left kidney worrisome for a small malignancy. 3. Probable cholelithiasis. 4. 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 Electronically Signed   By: Lorriane Shire M.D.   On: 02/06/2015 15:05   US Renal  02/07/2015  CLINICAL DATA:  Left renal mass on CT. Subtle 12 mm mass appreciated along the posterior cortical margin of the lower left kidney on recent CT. EXAM: RENAL / URINARY TRACT ULTRASOUND COMPLETE COMPARISON:  CT dated 02/06/2015. FINDINGS: Right Kidney: Length: 10.5 cm. Echogenicity within normal limits. No mass, stone or hydronephrosis visualized. Left Kidney: Length:  11.5 cm. Subtle isoechoic-to-hyperechoic mass appreciated within the lower pole of the left kidney, measuring 2.3 x 2 cm, a possible correlate for the recent CT finding. Left kidney otherwise unremarkable. No renal stone or hydronephrosis. Bladder: Avascular material is present along the dependent/posterior wall of the bladder most suggestive of debris. Bladder otherwise unremarkable. IMPRESSION: 1. Subtle isoechoic-to-hyperechoic mass within the lower pole of the LEFT kidney, measuring 2.3 x 2 cm, not adequately visualized for definitive characterization, a possible correlate for the 1.2 cm mass seen on recent CT. Recommend more definitive characterization with either renal protocol CT (with and without contrast) or renal protocol MRI. 2. Right kidney appears normal. 3. Probable debris within the dependent aspects of the bladder. Bladder otherwise unremarkable. Electronically Signed   By: Franki Cabot M.D.   On: 02/07/2015 09:37   Dg Chest Portable 1 View  02/06/2015  CLINICAL DATA:  Weakness. EXAM: PORTABLE CHEST 1 VIEW COMPARISON:  No prior available for comparison. FINDINGS: Patient is rotated. Mediastinum and hilar structures normal. Mild left base subsegmental atelectasis. Heart size normal. No pleural effusion or pneumothorax. IMPRESSION: Mild left base subsegmental atelectasis . Electronically Signed   By: Marcello Moores  Register   On: 02/06/2015 15:49   Dg Hip Operative Unilat With Pelvis Right  02/09/2015  CLINICAL DATA:  55 year old male status post right hip replacement. EXAM: OPERATIVE RIGHT HIP (WITH PELVIS IF PERFORMED) 3 VIEWS TECHNIQUE: Fluoroscopic spot image(s) were submitted for interpretation post-operatively. COMPARISON:  Right femur radiograph 02/06/2015. FINDINGS: 3 intraoperative views of the right hip demonstrate performance of right hip bipolar hemiarthroplasty. No definite periprosthetic fracture identified on this limited examination. IMPRESSION: 1. Intraoperative documentation of right  hip bipolar hemiarthroplasty. Electronically Signed   By: Vinnie Langton M.D.   On: 02/09/2015 18:40   Dg Hip Unilat With Pelvis 2-3 Views Right  02/09/2015  CLINICAL DATA:  55 year old male status post right hip arthroplasty. EXAM: DG HIP (WITH OR WITHOUT PELVIS) 2-3V RIGHT COMPARISON:  No priors. FINDINGS: Two views of the right hip demonstrate postoperative changes of bipolar hemiarthroplasty in the right hip. No periprosthetic fracture or other immediate complicating features. Small amount of gas  in the hip joint and overlying soft tissues, with lateral skin staples in place. Femoral head is properly located. IMPRESSION: 1. Postoperative changes of right hip bipolar hemiarthroplasty without acute complicating features, as above. Electronically Signed   By: Vinnie Langton M.D.   On: 02/09/2015 19:18   Dg Femur, Min 2 Views Right  02/06/2015  CLINICAL DATA:  Nonverbal patient, holding right leg in a flexed position. EXAM: RIGHT FEMUR 2 VIEWS COMPARISON:  None. FINDINGS: There is a displaced/ comminuted fracture of the right femoral neck, subcapital region with extension into the lateral portion of the right femoral head, and with at least mild impaction at the fracture site. Main component of the right femoral head remains well positioned relative to the acetabulum. Probable associated joint effusion. Mid and distal portions of the right femur appear intact and well aligned throughout. IMPRESSION: Displaced/comminuted fracture of the right femoral neck, centered within the subcapital region but with extension into lateral portion of the right femoral head. At least some degree of associated impaction at the fracture site. Main component of the femoral head remains well positioned relative to the acetabulum. Electronically Signed   By: Franki Cabot M.D.   On: 02/06/2015 15:17     Assessment/Plan   ICD-9-CM ICD-10-CM   1. History of total right hip arthroplasty V43.64 Z96.641   2. Intertrochanteric  fracture of right hip, closed, initial encounter ([place]) 820.21 S72.141A   3. Post-op pneumonia 997.39 J95.88    486 J18.9   4. Other constipation 564.09 K59.09   5. Left renal mass 593.9 N28.89   6. Pressure ulcer 707.00 L89.90    707.20    7. History of hyperthyroidism V12.29 Z86.39   8. Dementia, without behavioral disturbance 294.20 F03.90   9. Benign essential HTN 401.1 I10   10. Depression 311 F32.9   11. B12 deficiency 266.2 E53.8   12. Chronic pain syndrome 338.4 G89.4   13. Cerebral palsy, unspecified type ([place]) 343.9 G80.9     Finish levaquin (complete Jan 20th)  Cont current meds as ordered  F/u with urology and ortho as scheduled  Wound care to ankle pressure ulcer  PT/OT/ST as indicated  Nutritional supplements as ordered  GOAL: short term rehab with possible long term placement. Communicated with pt and nursing.  Will follow  Burwell Bethel S. Perlie Gold  [facility] [address] [place], [place] [address] [phone] Cell (Monday-Friday 8 AM - 5 PM) [phone] After 5 PM and follow prompts

## 2015-02-24 ENCOUNTER — Ambulatory Visit: Payer: Self-pay | Admitting: Urology

## 2015-03-09 ENCOUNTER — Ambulatory Visit: Payer: Medicare Other

## 2015-03-12 ENCOUNTER — Ambulatory Visit: Payer: Medicare Other

## 2015-03-19 ENCOUNTER — Non-Acute Institutional Stay (SKILLED_NURSING_FACILITY): Payer: Medicare Other | Admitting: Adult Health

## 2015-03-19 ENCOUNTER — Encounter: Payer: Self-pay | Admitting: Adult Health

## 2015-03-19 DIAGNOSIS — K5901 Slow transit constipation: Secondary | ICD-10-CM

## 2015-03-19 DIAGNOSIS — N3944 Nocturnal enuresis: Secondary | ICD-10-CM | POA: Diagnosis not present

## 2015-03-19 DIAGNOSIS — S72002S Fracture of unspecified part of neck of left femur, sequela: Secondary | ICD-10-CM | POA: Diagnosis not present

## 2015-03-19 DIAGNOSIS — N2889 Other specified disorders of kidney and ureter: Secondary | ICD-10-CM

## 2015-03-19 DIAGNOSIS — G809 Cerebral palsy, unspecified: Secondary | ICD-10-CM

## 2015-03-19 DIAGNOSIS — F01518 Vascular dementia, unspecified severity, with other behavioral disturbance: Secondary | ICD-10-CM

## 2015-03-19 DIAGNOSIS — F0151 Vascular dementia with behavioral disturbance: Secondary | ICD-10-CM | POA: Diagnosis not present

## 2015-03-19 DIAGNOSIS — E232 Diabetes insipidus: Secondary | ICD-10-CM

## 2015-03-19 DIAGNOSIS — F323 Major depressive disorder, single episode, severe with psychotic features: Secondary | ICD-10-CM | POA: Diagnosis not present

## 2015-03-19 NOTE — Progress Notes (Signed)
Patient ID: Maurice Clayton, male   DOB: 17-Nov-1960, 55 y.o.   MRN: [id number]   Facility: [facility]       No Known Allergies  Chief Complaint  Patient presents with  . Medical Management of Chronic Issues    Follow Up    HPI:  He is a resident of this facility being seen for the management of his chronic illnesses. He is restless; I am concerned that this could be a pain response. He is unable to participate in the hpi or ros. There are no nursing concerns at this time   Past Medical History  Diagnosis Date  . Cerebral palsy (HCC)     bed bound status  . Dementia   . HTN (hypertension)   . Depression   . Vitamin B 12 deficiency   . Hyperthyroidism     Past Surgical History  Procedure Laterality Date  . Dental procedure    . Foot surgeries      bilateral for cerebral palsy  . Total hip arthroplasty Right 02/09/2015    Procedure: TOTAL HIP ARTHROPLASTY ANTERIOR APPROACH;  Surgeon: Kennedy Bucker, MD;  Location: [facility] ORS;  Service: Orthopedics;  Laterality: Right;    VITAL SIGNS BP 92/50 mmHg  Pulse 86  Temp(Src) 99 F (37.2 C) (Oral)  Resp 18  Ht 5\' 7"  (1.702 m)  Wt 105 lb 2 oz (47.684 kg)  BMI 16.46 kg/m2  SpO2 97%  Patient's Medications  New Prescriptions   No medications on file  Previous Medications   ACETAMINOPHEN (TYLENOL) 325 MG TABLET    Take 325 mg by mouth every 6 (six) hours as needed for mild pain, fever or headache.   ASPIRIN EC 81 MG TABLET    Take 81 mg by mouth every evening.   DESMOPRESSIN (DDAVP) 0.1 MG TABLET    Take 0.1 mg by mouth at bedtime.    DOCUSATE SODIUM (COLACE) 100 MG CAPSULE    Take 1 capsule (100 mg total) by mouth 2 (two) times daily.   ESCITALOPRAM (LEXAPRO) 20 MG TABLET    Take 20 mg by mouth daily.   FEEDING SUPPLEMENT, ENSURE ENLIVE, (ENSURE ENLIVE) LIQD    Take 237 mLs by mouth 2 (two) times daily with a meal.   HYDROCORTISONE (ANUSOL-HC) 2.5 % RECTAL CREAM    Place 1 application rectally every 12 (twelve) hours as needed.      MEMANTINE (NAMENDA XR) 28 MG CP24 24 HR CAPSULE    Take 28 mg by mouth every evening.   MIRTAZAPINE (REMERON) 15 MG TABLET    Take 15 mg by mouth every evening.    OXYCODONE (OXY IR/ROXICODONE) 5 MG IMMEDIATE RELEASE TABLET    Take 1-2 tablets (5-10 mg total) by mouth every 4 (four) hours as needed for breakthrough pain.   POLYETHYLENE GLYCOL (MIRALAX / GLYCOLAX) PACKET    Take 17 g by mouth daily.   RISPERIDONE (RISPERDAL) 1 MG TABLET    Take 1 mg by mouth 3 (three) times daily.   RIVASTIGMINE (EXELON) 4.6 MG/24HR    Place 4.6 mg onto the skin daily.  Modified Medications   No medications on file  Discontinued Medications     SIGNIFICANT DIAGNOSTIC EXAMS  02-06-15: ct of abdomen and pelvis: 1. Massive fecal impaction with extensive stool throughout the olon. 2. 12 mm mass on the lower pole of the left kidney worrisome for a small malignancy. 3. Probable cholelithiasis. 4. 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  02-06-15: ct of head: No acute intracranial process. Mega cisterna magna. Mild cortical volume loss.  02-06-15: right femur x-ray: Displaced/comminuted fracture of the right femoral neck, centered within the subcapital region but with extension into lateral portion of the right femoral head. At least some degree of associated impaction at the fracture site. Main component of the femoral head remains well positioned relative to the acetabulum.  02-06-15: right fibula/tibia x-ray: No acute abnormality. Atrophic musculature right lower leg. Talonavicular degenerative change.   02-06-15: chest x-ray: Mild left base subsegmental atelectasis .  02-07-15: renal ultrasound: 1. Subtle isoechoic-to-hyperechoic mass within the lower pole of the LEFT kidney, measuring 2.3 x 2 cm, not adequately visualized for definitive characterization, a possible correlate for the 1.2 cm mass seen on recent CT. Recommend more definitive characterization with either renal protocol CT (with  and without contrast) or renal protocol MRI. 2. Right kidney appears normal. 3. Probable debris within the dependent aspects of the bladder. Bladder otherwise unremarkable  02-08-15; kub: Findings are compatible with the given history of constipation. Large stool ball distending the rectal vault, measuring at least 11 x 9 cm. Large amount of stool throughout the colon.   LABS REVIEWED:   02-06-15: wbc 6.3; hgb 15.2; hct 46.1; mcv 90.7; plt 281; glucose 96; bun 9; creat 0.55; k+ 3.9; na++ 139; liver normal albumin 3.5 02-07-15: wbc 7.6; hgb 14.8; hct 44.0; mcv 94.0; plt 259; glucose 100; bun 10; creat 0.83; k+ 3.9; na++141 02-12-15: wbc 5.3; hgb 11.8; hct 34.8; mcv 90.5; plt 151; glucose 110; bun 10; creat 0.51; k+ 3.4; na++137      Review of Systems  Unable to perform ROS: dementia      Physical Exam  Constitutional: No distress.  Frail   Eyes: Conjunctivae are normal.  Neck: Neck supple. No JVD present. No thyromegaly present.  Cardiovascular: Normal rate, regular rhythm and intact distal pulses.   Respiratory: Effort normal and breath sounds normal. No respiratory distress. He has no wheezes.  GI: Soft. Bowel sounds are normal. He exhibits no distension. There is no tenderness.  Musculoskeletal: He exhibits no edema.  Able to move all extremities  Is status post right femur x-ray   Lymphadenopathy:    He has no cervical adenopathy.  Neurological:  Is aware of surroundings Is restless    Skin: Skin is warm and dry. He is not diaphoretic.     ASSESSMENT/PLAN  1. Left femoral head fracture: will follow up with orthopedics as indicated and will continue therapy as directed; will continue oxycodone 5 or 10 mg every 4 hours as needed; will begin him on routine tylenol 650 mg three times daily and will monitor his status.   2. Constipation: has impaction at hospital will continue colace twice daily; miralax daily   3. Hypertension: is presently stable; is not taking medications  will not make changes  4. Major depression: will continue lexapro 20 mg daily and will continue risperdal 1 mg three times daily and will continue remeron 15 mg nightly   5. Vascular dementia: has cerebral palsy: without significant change in status; will continue namenda xr 28 mg daily; will continue exelon patch 4.6 mg daily will monitor   6. He is taking DDAVP 0.1 mg nightly at this time I am not certain if he is taking this medication for nocturnal enuresis or diabetes insipidus. Will check cmp mag; and will have nursing monitor his fluid intake. I have spoken with Dr. Montez Morita regarding this issue. She is in  agreement with this plan.   7. Left renal mass: will continue to monitor his status and will have him return to urology    Time spent with patient  40  minutes >50% time spent counseling; reviewing medical record; tests; labs; and developing future plan of care         Synthia Innocent NP [facility]  Contact [phone] Monday through Friday 8am- 5pm  After hours call [phone]

## 2015-03-20 LAB — BASIC METABOLIC PANEL
BUN: 9 mg/dL (ref 4–21)
CREATININE: 0.6 mg/dL (ref 0.6–1.3)
Glucose: 191 mg/dL
Potassium: 4.2 mmol/L (ref 3.4–5.3)
SODIUM: 138 mmol/L (ref 137–147)

## 2015-03-20 LAB — TSH: TSH: 1.48 u[IU]/mL (ref 0.41–5.90)

## 2015-03-20 LAB — CBC AND DIFFERENTIAL
HCT: 45 % (ref 41–53)
Hemoglobin: 14.9 g/dL (ref 13.5–17.5)
Neutrophils Absolute: 6 /uL
Platelets: 236 10*3/uL (ref 150–399)

## 2015-03-27 ENCOUNTER — Encounter: Payer: Self-pay | Admitting: Urology

## 2015-03-27 ENCOUNTER — Ambulatory Visit (INDEPENDENT_AMBULATORY_CARE_PROVIDER_SITE_OTHER): Payer: Medicare Other | Admitting: Urology

## 2015-03-27 DIAGNOSIS — F01518 Vascular dementia, unspecified severity, with other behavioral disturbance: Secondary | ICD-10-CM | POA: Insufficient documentation

## 2015-03-27 DIAGNOSIS — K5901 Slow transit constipation: Secondary | ICD-10-CM

## 2015-03-27 DIAGNOSIS — N2889 Other specified disorders of kidney and ureter: Secondary | ICD-10-CM

## 2015-03-27 DIAGNOSIS — S72009A Fracture of unspecified part of neck of unspecified femur, initial encounter for closed fracture: Secondary | ICD-10-CM | POA: Insufficient documentation

## 2015-03-27 DIAGNOSIS — F323 Major depressive disorder, single episode, severe with psychotic features: Secondary | ICD-10-CM | POA: Insufficient documentation

## 2015-03-27 DIAGNOSIS — G809 Cerebral palsy, unspecified: Secondary | ICD-10-CM | POA: Insufficient documentation

## 2015-03-27 DIAGNOSIS — F0151 Vascular dementia with behavioral disturbance: Secondary | ICD-10-CM | POA: Insufficient documentation

## 2015-03-27 HISTORY — DX: Slow transit constipation: K59.01

## 2015-03-27 NOTE — Progress Notes (Signed)
03/27/2015 9:44 AM   Maurice Clayton [birth year] [id number]  Referring provider: No referring provider defined for this encounter.  Chief Complaint  Patient presents with  . Follow-up    hosptial f/u    HPI: 55yo with a hx of cerebal palsy and severe dementia who was admitted with weakness and abdominal pain. He was also found to have a fractured hip. He is in a group home. He underwent CT scan which showed severe constipation and a 12mm left lower pole renal mass. He denies any LUTS, no gross hematuria. He is a very poor historian and his history was obtained from his parents. The patients maternal grandmother had a hx of renal mass. He is currently undergoing treatment for constipation and is being considered for repair of his hip fracture.  February 2017 interval history: The patient returns after his hospitalization discuss active for surveillance for his small lower pole renal mass. As noted above the patient is severe medical comorbidities that include cerebral palsy with severe dementia.   PMH: Past Medical History  Diagnosis Date  . Cerebral palsy (HCC)     bed bound status  . Dementia   . HTN (hypertension)   . Depression   . Vitamin B 12 deficiency   . Hyperthyroidism     Surgical History: Past Surgical History  Procedure Laterality Date  . Dental procedure    . Foot surgeries      bilateral for cerebral palsy  . Total hip arthroplasty Right 02/09/2015    Procedure: TOTAL HIP ARTHROPLASTY ANTERIOR APPROACH;  Surgeon: Kennedy Bucker, MD;  Location: [facility] ORS;  Service: Orthopedics;  Laterality: Right;    Home Medications:    Medication List       This list is accurate as of: 03/27/15  9:44 AM.  Always use your most recent med list.               acetaminophen 325 MG tablet  Commonly known as:  TYLENOL  Take 325 mg by mouth every 6 (six) hours as needed for mild pain, fever or headache.     aspirin EC 81 MG tablet  Take 81 mg by mouth every evening.     desmopressin 0.1 MG tablet  Commonly known as:  DDAVP  Take 0.1 mg by mouth at bedtime.     docusate sodium 100 MG capsule  Commonly known as:  COLACE  Take 1 capsule (100 mg total) by mouth 2 (two) times daily.     escitalopram 20 MG tablet  Commonly known as:  LEXAPRO  Take 20 mg by mouth daily.     EXELON 4.6 mg/24hr  Generic drug:  rivastigmine  Place onto the skin.     hydrocortisone 2.5 % rectal cream  Commonly known as:  ANUSOL-HC  Place 1 application rectally every 12 (twelve) hours as needed.     mirtazapine 15 MG tablet  Commonly known as:  REMERON  Take 15 mg by mouth every evening.     NAMENDA XR 28 MG Cp24 24 hr capsule  Generic drug:  memantine  Take 28 mg by mouth every evening.     oxyCODONE 5 MG immediate release tablet  Commonly known as:  Oxy IR/ROXICODONE  Take 1-2 tablets (5-10 mg total) by mouth every 4 (four) hours as needed for breakthrough pain.     polyethylene glycol packet  Commonly known as:  MIRALAX / GLYCOLAX  Take 17 g by mouth daily.     risperiDONE 1 MG  tablet  Commonly known as:  RISPERDAL  Take 1 mg by mouth 3 (three) times daily.        Allergies: No Known Allergies  Family History: Family History  Problem Relation Age of Onset  . Dementia Mother   . CAD Brother   . Aortic aneurysm Brother   . Aortic aneurysm Father   . Kidney cancer Neg Hx   . Kidney disease Neg Hx   . Prostate cancer Neg Hx     Social History:  reports that he has never smoked. He does not have any smokeless tobacco history on file. He reports that he does not drink alcohol or use illicit drugs.  ROS:                                        Physical Exam: There were no vitals taken for this visit.  Constitutional:  Alert and oriented, No acute distress. HEENT: [place] AT, moist mucus membranes.  Trachea midline, no masses. Cardiovascular: No clubbing, cyanosis, or edema. Respiratory: Normal respiratory effort, no increased work  of breathing. GI: Abdomen is soft, nontender, nondistended, no abdominal masses GU: No CVA tenderness.  Skin: No rashes, bruises or suspicious lesions. Lymph: No cervical or inguinal adenopathy. Neurologic: Grossly intact, no focal deficits, moving all 4 extremities. Psychiatric: Normal mood and affect.  Laboratory Data: Lab Results  Component Value Date   WBC 5.3 02/12/2015   HGB 11.8* 02/12/2015   HCT 34.8* 02/12/2015   MCV 90.5 02/12/2015   PLT 151 02/12/2015    Lab Results  Component Value Date   CREATININE 0.51* 02/12/2015    No results found for: PSA  No results found for: TESTOSTERONE  No results found for: HGBA1C  Urinalysis    Component Value Date/Time   COLORURINE YELLOW* 02/06/2015 1325   APPEARANCEUR CLEAR* 02/06/2015 1325   LABSPEC 1.011 02/06/2015 1325   PHURINE 6.0 02/06/2015 1325   GLUCOSEU NEGATIVE 02/06/2015 1325   HGBUR NEGATIVE 02/06/2015 1325   BILIRUBINUR NEGATIVE 02/06/2015 1325   KETONESUR NEGATIVE 02/06/2015 1325   PROTEINUR NEGATIVE 02/06/2015 1325   NITRITE NEGATIVE 02/06/2015 1325   LEUKOCYTESUR NEGATIVE 02/06/2015 1325    Pertinent Imaging: CLINICAL DATA: Decreased appetite. Altered mental status.  EXAM: CT ABDOMEN AND PELVIS WITH CONTRAST  TECHNIQUE: Multidetector CT imaging of the abdomen and pelvis was performed using the standard protocol following bolus administration of intravenous contrast.  CONTRAST: OMNIPAQUE IOHEXOL 300 MG/ML SOLN  COMPARISON: None.  FINDINGS: Lower chest: Lung bases are clear. Heart size is normal. Tiny nonspecific pericardial effusion.  Hepatobiliary: The liver parenchyma is normal. There is suggestion of faint stones in the gallbladder. Minimal prominence of the intrahepatic bile ducts. Is the patient's bilirubin normal?  Pancreas: Normal.  Spleen: Normal.  Adrenals/Urinary Tract: There is a subtle 12 mm mass on the posterior aspect of the lower pole the left kidney  worrisome for malignancy. 8 mm cyst on the upper pole of the left kidney. Adrenal glands are normal. Right kidney is normal. No hydronephrosis. Bladder is normal.  Stomach/Bowel: The patient has a massive fecal impaction measuring approximately 10 cm in diameter with marked distention of the rectum. There is extensive stool throughout the entire colon. No small bowel dilatation.  Vascular/Lymphatic: Calcification in the distal abdominal aorta and iliac arteries. No adenopathy.  Reproductive: Normal.  Other: No free air or free fluid.  Musculoskeletal: Old nonunion  fracture of the right femoral neck with impaction.  IMPRESSION: 1. Massive fecal impaction with extensive stool throughout the colon. 2. 12 mm mass on the lower pole of the left kidney worrisome for a small malignancy. 3. Probable cholelithiasis. 4. 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  Assessment & Plan:    I again had a long discussion with the patient's brother regarding his renal mass. Given the small size of the mass in the patient's overall medical condition, the family elected to continue active surveillance. They do understand that theoretically during this interval the mass could grow and spread though this is not very likely. He will follow-up in 3 months with a repeat renal ultrasound. If there is no significant change in the size of the patient's renal mass, we'll consider increasing the interval between imaging studies.  1. Left lower pole 1.2 cm renal mass -continue active surveillance with repeat renal ultrasound in 3 months   Return in about 3 months (around 06/24/2015) for with renal ultrasound prior at [facility] (per patient preference).  Hildred Laser, MD  [facility] [address] [place], [place] [address] [phone]

## 2015-04-02 ENCOUNTER — Non-Acute Institutional Stay (SKILLED_NURSING_FACILITY): Payer: Medicare Other | Admitting: Adult Health

## 2015-04-02 ENCOUNTER — Encounter: Payer: Self-pay | Admitting: Adult Health

## 2015-04-02 DIAGNOSIS — F323 Major depressive disorder, single episode, severe with psychotic features: Secondary | ICD-10-CM | POA: Diagnosis not present

## 2015-04-02 DIAGNOSIS — S72002S Fracture of unspecified part of neck of left femur, sequela: Secondary | ICD-10-CM | POA: Diagnosis not present

## 2015-04-02 DIAGNOSIS — F01518 Vascular dementia, unspecified severity, with other behavioral disturbance: Secondary | ICD-10-CM

## 2015-04-02 DIAGNOSIS — F0151 Vascular dementia with behavioral disturbance: Secondary | ICD-10-CM | POA: Diagnosis not present

## 2015-04-02 LAB — CALCIUM
Calcium: 8.8 mg/dL
Folate: 14.1

## 2015-04-02 NOTE — Progress Notes (Signed)
Patient ID: Maurice Clayton, male   DOB: 1961/01/04, 55 y.o.   MRN: [id number]   Facility: [facility]       No Known Allergies  Chief Complaint  Patient presents with  . Acute Visit    Patient needs    HPI:   staff reports that his behaviors have gotten out of control. He yells all the time and the staff is unable to console him. He cannot participate in the hpi or ros. He has been recently weaned from risperdal 1 mg three times daily to 1 mg twice daily and 0.5 mg during the day. He is not tolerating this wean.    Past Medical History  Diagnosis Date  . Cerebral palsy (HCC)     bed bound status  . Dementia   . HTN (hypertension)   . Depression   . Vitamin B 12 deficiency   . Hyperthyroidism     Past Surgical History  Procedure Laterality Date  . Dental procedure    . Foot surgeries      bilateral for cerebral palsy  . 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   ASPIRIN EC 81 MG TABLET    Take 81 mg by mouth every evening.   DESMOPRESSIN (DDAVP) 0.1 MG TABLET    Take 2.5 mg by mouth at bedtime.    DEXTROMETHORPHAN-QUINIDINE 20-10 MG CAPS    Take 1 tablet by mouth 2 (two) times daily.   DOCUSATE SODIUM (COLACE) 100 MG CAPSULE    Take 1 capsule (100 mg total) by mouth 2 (two) times daily.   ESCITALOPRAM (LEXAPRO) 20 MG TABLET    Take 20 mg by mouth daily.   HYDROCORTISONE (ANUSOL-HC) 2.5 % RECTAL CREAM    Place 1 application rectally every 12  (twelve) hours as needed.    LORAZEPAM (ATIVAN) 0.5 MG TABLET    Take 0.5 mg by mouth every 6 (six) hours as needed for anxiety.   MEMANTINE (NAMENDA XR) 28 MG CP24 24 HR CAPSULE    Take 28 mg by mouth every evening.   MIRTAZAPINE (REMERON) 15 MG TABLET    Take 15 mg by mouth every evening.    OXYCODONE (OXY IR/ROXICODONE) 5 MG IMMEDIATE RELEASE TABLET    Take 1-2 tablets (5-10 mg total) by mouth every 4 (four) hours as needed for breakthrough pain.   POLYETHYLENE GLYCOL (MIRALAX / GLYCOLAX) PACKET    Take 17 g by mouth daily.   RISPERIDONE (RISPERDAL) 1 MG TABLET    Take 0.5 mg by mouth every morning.   RISPERIDONE (RISPERDAL) 1 MG TABLET    Take 1 mg by mouth 2 (two) times daily.   RIVASTIGMINE (EXELON) 4.6 MG/24HR    Place onto the skin.  Modified Medications   No medications on file  Discontinued Medications   No medications on file     SIGNIFICANT DIAGNOSTIC EXAMS  02-06-15: ct of abdomen and pelvis: 1. Massive fecal impaction with extensive stool throughout the olon. 2. 12 mm mass on the lower pole of the left kidney worrisome for a small malignancy. 3. Probable cholelithiasis. 4. 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  02-06-15: ct of head: No acute intracranial process. Mega cisterna magna. Mild cortical volume loss.  02-06-15: right femur x-ray: Displaced/comminuted fracture of the right femoral neck, centered within the subcapital region but with extension into lateral portion of the right femoral head. At least some degree of associated impaction at the fracture site. Main component of the femoral head remains well positioned relative to the acetabulum.  02-06-15: right fibula/tibia x-ray: No acute abnormality. Atrophic musculature right lower leg. Talonavicular degenerative change.   02-06-15: chest x-ray: Mild left base subsegmental atelectasis .  02-07-15: renal ultrasound: 1. Subtle isoechoic-to-hyperechoic mass within the lower pole of the LEFT kidney,  measuring 2.3 x 2 cm, not adequately visualized for definitive characterization, a possible correlate for the 1.2 cm mass seen on recent CT. Recommend more definitive characterization with either renal protocol CT (with and without contrast) or renal protocol MRI. 2. Right kidney appears normal. 3. Probable debris within the dependent aspects of the bladder. Bladder otherwise unremarkable  02-08-15; kub: Findings are compatible with the given history of constipation. Large stool ball distending the rectal vault, measuring at least 11 x 9 cm. Large amount of stool throughout the colon.   LABS REVIEWED:   02-06-15: wbc 6.3; hgb 15.2; hct 46.1; mcv 90.7; plt 281; glucose 96; bun 9; creat 0.55; k+ 3.9; na++ 139; liver normal albumin 3.5 02-07-15: wbc 7.6; hgb 14.8; hct 44.0; mcv 94.0; plt 259; glucose 100; bun 10; creat 0.83; k+ 3.9; na++141 02-12-15: wbc 5.3; hgb 11.8; hct 34.8; mcv 90.5; plt 151; glucose 110; bun 10; creat 0.51; k+ 3.4; na++137      Review of Systems  Unable to perform ROS: dementia      Physical Exam  Constitutional: No distress.  Frail   Eyes: Conjunctivae are normal.  Neck: Neck supple. No JVD present. No thyromegaly present.  Cardiovascular: Normal rate, regular rhythm and intact distal pulses.   Respiratory: Effort normal and breath sounds normal. No respiratory distress. He has no wheezes.  GI: Soft. Bowel sounds are normal. He exhibits no distension. There is no tenderness.  Musculoskeletal: He exhibits no edema.  Able to move all extremities  Is status post right femur x-ray   Lymphadenopathy:    He has no cervical adenopathy.  Neurological:  Is aware of surroundings Is restless; and is agitated.     Skin: Skin is warm and dry. He is not diaphoretic.     ASSESSMENT/PLAN  1. Left femoral head fracture: will follow up with orthopedics as indicated and will continue therapy as directed; will continue oxycodone 5 or 10 mg every 4 hours as needed; will continue  tylenol 650 mg three times daily and will monitor his status.   2. Major depression: will continue lexapro 20 mg daily and will restart risperdal 1 mg three times daily and will continue remeron 15 mg nightly and will begin ativan 0.5 mg every 4 hours as needed  3. Vascular dementia: has cerebral palsy: without significant change in status; will continue namenda xr 28 mg daily; will continue exelon patch 4.6 mg daily will monitor     Will check bmp in the AM      Synthia Innocent NP [facility]  Contact  [phone] Monday through Friday 8am- 5pm  After hours call [phone]

## 2015-04-03 LAB — BASIC METABOLIC PANEL
BUN: 13 mg/dL (ref 4–21)
CREATININE: 0.6 mg/dL (ref 0.6–1.3)
GLUCOSE: 92 mg/dL
POTASSIUM: 4.3 mmol/L (ref 3.4–5.3)
SODIUM: 139 mmol/L (ref 137–147)

## 2015-04-04 MED ORDER — LORAZEPAM 0.5 MG PO TABS: 0.5000 mg | ORAL_TABLET | Freq: Two times a day (BID) | ORAL | Status: DC | PRN

## 2015-04-16 ENCOUNTER — Non-Acute Institutional Stay (SKILLED_NURSING_FACILITY): Payer: Medicare Other | Admitting: Adult Health

## 2015-04-16 ENCOUNTER — Encounter: Payer: Self-pay | Admitting: Adult Health

## 2015-04-16 DIAGNOSIS — F482 Pseudobulbar affect: Secondary | ICD-10-CM | POA: Diagnosis not present

## 2015-04-16 DIAGNOSIS — F0151 Vascular dementia with behavioral disturbance: Secondary | ICD-10-CM | POA: Diagnosis not present

## 2015-04-16 DIAGNOSIS — K5901 Slow transit constipation: Secondary | ICD-10-CM

## 2015-04-16 DIAGNOSIS — S72002S Fracture of unspecified part of neck of left femur, sequela: Secondary | ICD-10-CM

## 2015-04-16 DIAGNOSIS — F323 Major depressive disorder, single episode, severe with psychotic features: Secondary | ICD-10-CM

## 2015-04-16 DIAGNOSIS — F01518 Vascular dementia, unspecified severity, with other behavioral disturbance: Secondary | ICD-10-CM

## 2015-04-16 NOTE — Progress Notes (Signed)
Patient ID: Maurice Clayton Maurice Clayton, male   DOB: [birth year], 55 y.o.   MRN: [id number]   [facility]       No Known Allergies  Chief Complaint  Patient presents with  . Medical Management of Chronic Issues    Follow up    HPI:  He is a resident of this facility being seen for the management of his chronic illnesses. He continues to have episodes of yelling out without any apparent need. I have spoken to Maurice SpanielLinda Clayton regarding his status; we both feel as though this could have a PBA component to his behavioral issues. He is unable to participate in the hpi or ros.    Past Medical History  Diagnosis Date  . Cerebral palsy (HCC)     bed bound status  . Dementia   . HTN (hypertension)   . Depression   . Vitamin B 12 deficiency   . Hyperthyroidism     Past Surgical History  Procedure Laterality Date  . Dental procedure    . Foot surgeries      bilateral for cerebral palsy  . Total hip arthroplasty Right 02/09/2015    Procedure: TOTAL HIP ARTHROPLASTY ANTERIOR APPROACH;  Surgeon: Maurice BuckerMichael Menz, MD;  Location: [facility] ORS;  Service: Orthopedics;  Laterality: Right;    VITAL SIGNS BP 119/56 mmHg  Pulse 80  Temp(Src) 97.8 F (36.6 C) (Oral)  Resp 18  Ht 5\' 7"  (1.702 m)  Wt 109 lb (49.442 kg)  BMI 17.07 kg/m2  SpO2 98%  Patient's Medications  New Prescriptions   No medications on file  Previous Medications   ACETAMINOPHEN (TYLENOL) 325 MG TABLET    Take 325 mg by mouth every 6 (six) hours as needed for mild pain, fever or headache.   ASPIRIN EC 81 MG TABLET    Take 81 mg by mouth every evening.   DESMOPRESSIN (DDAVP) 0.1 MG TABLET    Take 2.5 mg by mouth at bedtime.    DOCUSATE SODIUM (COLACE) 100 MG CAPSULE    Take 1 capsule (100 mg total) by mouth 2 (two) times daily.   ESCITALOPRAM (LEXAPRO) 20 MG TABLET    Take 20 mg by mouth daily.   LORAZEPAM (ATIVAN) 0.5 MG TABLET    Take 1 tablet (0.5 mg total) by mouth 2 (two) times daily as needed for anxiety.   MEMANTINE  (NAMENDA XR) 28 MG CP24 24 HR CAPSULE    Take 28 mg by mouth every evening.   MIRTAZAPINE (REMERON) 15 MG TABLET    Take 15 mg by mouth every evening.    OXYCODONE (OXY IR/ROXICODONE) 5 MG IMMEDIATE RELEASE TABLET    Take 1-2 tablets (5-10 mg total) by mouth every 4 (four) hours as needed for breakthrough pain.   POLYETHYLENE GLYCOL (MIRALAX / GLYCOLAX) PACKET    Take 17 g by mouth daily.   RISPERIDONE (RISPERDAL) 1 MG TABLET    Take 1 mg by mouth 3 (three) times daily.   RIVASTIGMINE (EXELON) 4.6 MG/24HR    Place onto the skin.  Modified Medications   No medications on file  Discontinued Medications     SIGNIFICANT DIAGNOSTIC EXAMS  02-06-15: ct of abdomen and pelvis: 1. Massive fecal impaction with extensive stool throughout the olon. 2. 12 mm mass on the lower pole of the left kidney worrisome for a small malignancy. 3. Probable cholelithiasis. 4. 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  02-06-15: ct of head: No acute intracranial process. Mega cisterna  magna. Mild cortical volume loss.  02-06-15: right femur x-ray: Displaced/comminuted fracture of the right femoral neck, centered within the subcapital region but with extension into lateral portion of the right femoral head. At least some degree of associated impaction at the fracture site. Main component of the femoral head remains well positioned relative to the acetabulum.  02-06-15: right fibula/tibia x-ray: No acute abnormality. Atrophic musculature right lower leg. Talonavicular degenerative change.   02-06-15: chest x-ray: Mild left base subsegmental atelectasis .  02-07-15: renal ultrasound: 1. Subtle isoechoic-to-hyperechoic mass within the lower pole of the LEFT kidney, measuring 2.3 x 2 cm, not adequately visualized for definitive characterization, a possible correlate for the 1.2 cm mass seen on recent CT. Recommend more definitive characterization with either renal protocol CT (with and without contrast) or  renal protocol MRI. 2. Right kidney appears normal. 3. Probable debris within the dependent aspects of the bladder. Bladder otherwise unremarkable  02-08-15; kub: Findings are compatible with the given history of constipation. Large stool ball distending the rectal vault, measuring at least 11 x 9 cm. Large amount of stool throughout the colon.   LABS REVIEWED:   02-06-15: wbc 6.3; hgb 15.2; hct 46.1; mcv 90.7; plt 281; glucose 96; bun 9; creat 0.55; k+ 3.9; na++ 139; liver normal albumin 3.5 02-07-15: wbc 7.6; hgb 14.8; hct 44.0; mcv 94.0; plt 259; glucose 100; bun 10; creat 0.83; k+ 3.9; na++141 02-12-15: wbc 5.3; hgb 11.8; hct 34.8; mcv 90.5; plt 151; glucose 110; bun 10; creat 0.51; k+ 3.4; na++137      Review of Systems  Unable to perform ROS: dementia      Physical Exam  Constitutional: No distress.  Frail   Eyes: Conjunctivae are normal.  Neck: Neck supple. No JVD present. No thyromegaly present.  Cardiovascular: Normal rate, regular rhythm and intact distal pulses.   Respiratory: Effort normal and breath sounds normal. No respiratory distress. He has no wheezes.  GI: Soft. Bowel sounds are normal. He exhibits no distension. There is no tenderness.  Musculoskeletal: He exhibits no edema.  Able to move all extremities  Is status post right femur fracture Lymphadenopathy:    He has no cervical adenopathy.  Neurological:  Is aware of surroundings Is restless;    Skin: Skin is warm and dry. He is not diaphoretic.     ASSESSMENT/PLAN  1. Left femoral head fracture: will follow up with orthopedics as indicated; will continue oxycodone 5 or 10 mg every 4 hours as needed; will continue tylenol 650 mg three times daily and will monitor his status.   2. Major depression: will continue lexapro 20 mg daily and will continue risperdal 1 mg three times daily and will continue remeron 15 mg nightly and will continue ativan 0.5 mg every 4 hours as needed  3. Vascular dementia: has  cerebral palsy: without significant change in status; will continue namenda xr 28 mg daily; will continue exelon patch 4.6 mg daily will monitor   4. 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

## 2015-04-20 DIAGNOSIS — F482 Pseudobulbar affect: Secondary | ICD-10-CM | POA: Insufficient documentation

## 2015-05-18 ENCOUNTER — Encounter: Payer: Self-pay | Admitting: Adult Health

## 2015-05-18 ENCOUNTER — Non-Acute Institutional Stay (SKILLED_NURSING_FACILITY): Payer: Medicare Other | Admitting: Adult Health

## 2015-05-18 DIAGNOSIS — K5901 Slow transit constipation: Secondary | ICD-10-CM

## 2015-05-18 DIAGNOSIS — F0151 Vascular dementia with behavioral disturbance: Secondary | ICD-10-CM | POA: Diagnosis not present

## 2015-05-18 DIAGNOSIS — S72002S Fracture of unspecified part of neck of left femur, sequela: Secondary | ICD-10-CM | POA: Diagnosis not present

## 2015-05-18 DIAGNOSIS — F323 Major depressive disorder, single episode, severe with psychotic features: Secondary | ICD-10-CM

## 2015-05-18 DIAGNOSIS — G809 Cerebral palsy, unspecified: Secondary | ICD-10-CM

## 2015-05-18 DIAGNOSIS — F01518 Vascular dementia, unspecified severity, with other behavioral disturbance: Secondary | ICD-10-CM

## 2015-05-18 NOTE — Progress Notes (Signed)
Patient ID: Maurice LericheBarry W Clayton, male   DOB: 05-12-1960, 55 y.o.   MRN: [id number]   [facility]       No Known Allergies  Chief Complaint  Patient presents with  . Medical Management of Chronic Issues    Follow up    HPI:  He is a long term resident of this facility being seen for the management of his chronic illnesses. He was started on nuedexta last month with the hope that the medication would help with his verbal outbursts. This has not happened. He is unable to participate in the hpi or ros. There are no nursing concerns at this time.    Past Medical History  Diagnosis Date  . Cerebral palsy (HCC)     bed bound status  . Dementia   . HTN (hypertension)   . Depression   . Vitamin B 12 deficiency   . Hyperthyroidism     Past Surgical History  Procedure Laterality Date  . Dental procedure    . Foot surgeries      bilateral for cerebral palsy  . Total hip arthroplasty Right 02/09/2015    Procedure: TOTAL HIP ARTHROPLASTY ANTERIOR APPROACH;  Surgeon: Kennedy BuckerMichael Menz, MD;  Location: [facility] ORS;  Service: Orthopedics;  Laterality: Right;    VITAL SIGNS BP 116/70 mmHg  Pulse 78  Temp(Src) 97.8 F (36.6 C) (Oral)  Resp 18  Ht 5\' 7"  (1.702 m)  Wt 108 lb (48.988 kg)  BMI 16.91 kg/m2  SpO2 95%  Patient's Medications  New Prescriptions   No medications on file  Previous Medications   ACETAMINOPHEN (TYLENOL) 325 MG TABLET    Take 650 mg by mouth every 8 (eight) hours as needed for mild pain, fever or headache.    ASPIRIN EC 81 MG TABLET    Take 81 mg by mouth every evening.   DESMOPRESSIN (DDAVP) 0.1 MG TABLET    Take 2.5 mg by mouth at bedtime.    DEXTROMETHORPHAN-QUINIDINE 20-10 MG CAPS    Take 1 tablet by mouth 2 (two) times daily.   DOCUSATE SODIUM (COLACE) 100 MG CAPSULE    Take 1 capsule (100 mg total) by mouth 2 (two) times daily.   ESCITALOPRAM (LEXAPRO) 20 MG TABLET    Take 20 mg by mouth daily.   HYDROCORTISONE (ANUSOL-HC) 25 MG SUPPOSITORY    Place 25 mg  rectally 2 (two) times daily.   LORAZEPAM (ATIVAN) 0.5 MG TABLET    Take 1 tablet (0.5 mg total) by mouth 2 (two) times daily as needed for anxiety.   MEMANTINE (NAMENDA XR) 28 MG CP24 24 HR CAPSULE    Take 28 mg by mouth every evening.   MIRTAZAPINE (REMERON) 15 MG TABLET    Take 15 mg by mouth every evening.    OXYCODONE (OXY IR/ROXICODONE) 5 MG IMMEDIATE RELEASE TABLET    Take 1-2 tablets (5-10 mg total) by mouth every 4 (four) hours as needed for breakthrough pain.   POLYETHYLENE GLYCOL (MIRALAX / GLYCOLAX) PACKET    Take 17 g by mouth daily.   RISPERIDONE (RISPERDAL) 1 MG TABLET    Take 1 mg by mouth 3 (three) times daily.   RIVASTIGMINE (EXELON) 4.6 MG/24HR    Place onto the skin.  Modified Medications   No medications on file  Discontinued Medications   No medications on file     SIGNIFICANT DIAGNOSTIC EXAMS  02-06-15: ct of abdomen and pelvis: 1. Massive fecal impaction with extensive stool throughout the olon. 2. 12  mm mass on the lower pole of the left kidney worrisome for a small malignancy. 3. Probable cholelithiasis. 4. 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  02-06-15: ct of head: No acute intracranial process. Mega cisterna magna. Mild cortical volume loss.  02-06-15: right femur x-ray: Displaced/comminuted fracture of the right femoral neck, centered within the subcapital region but with extension into lateral portion of the right femoral head. At least some degree of associated impaction at the fracture site. Main component of the femoral head remains well positioned relative to the acetabulum.  02-06-15: right fibula/tibia x-ray: No acute abnormality. Atrophic musculature right lower leg. Talonavicular degenerative change.   02-06-15: chest x-ray: Mild left base subsegmental atelectasis .  02-07-15: renal ultrasound: 1. Subtle isoechoic-to-hyperechoic mass within the lower pole of the LEFT kidney, measuring 2.3 x 2 cm, not adequately visualized for  definitive characterization, a possible correlate for the 1.2 cm mass seen on recent CT. Recommend more definitive characterization with either renal protocol CT (with and without contrast) or renal protocol MRI. 2. Right kidney appears normal. 3. Probable debris within the dependent aspects of the bladder. Bladder otherwise unremarkable  02-08-15; kub: Findings are compatible with the given history of constipation. Large stool ball distending the rectal vault, measuring at least 11 x 9 cm. Large amount of stool throughout the colon.  05-05-15: right hip fracture: no acute process    LABS REVIEWED:   02-06-15: wbc 6.3; hgb 15.2; hct 46.1; mcv 90.7; plt 281; glucose 96; bun 9; creat 0.55; k+ 3.9; na++ 139; liver normal albumin 3.5 02-07-15: wbc 7.6; hgb 14.8; hct 44.0; mcv 94.0; plt 259; glucose 100; bun 10; creat 0.83; k+ 3.9; na++141 02-12-15: wbc 5.3; hgb 11.8; hct 34.8; mcv 90.5; plt 151; glucose 110; bun 10; creat 0.51; k+ 3.4; na++137  04-03-15: glucose 92; bun 13; creat 0.63; k+ 4.3; na++139     Review of Systems  Unable to perform ROS: dementia      Physical Exam  Constitutional: No distress.  Frail   Eyes: Conjunctivae are normal.  Neck: Neck supple. No JVD present. No thyromegaly present.  Cardiovascular: Normal rate, regular rhythm and intact distal pulses.   Respiratory: Effort normal and breath sounds normal. No respiratory distress. He has no wheezes.  GI: Soft. Bowel sounds are normal. He exhibits no distension. There is no tenderness.  Musculoskeletal: He exhibits no edema.  Able to move all extremities  Is status post right femur fracture Lymphadenopathy:    He has no cervical adenopathy.  Neurological:  Is aware of surroundings Is restless;    Skin: Skin is warm and dry. He is not diaphoretic.     ASSESSMENT/PLAN  1. Left femoral head fracture: will follow up with orthopedics as indicated; will continue oxycodone 5 or 10 mg every 4 hours as needed; will continue  tylenol 650 mg three times daily and will monitor his status.   2. Major depression: will continue lexapro 20 mg daily and will continue risperdal 1 mg three times daily and will continue remeron 15 mg nightly and will continue ativan 0.5 mg every 4 hours as needed  3. Vascular dementia: has cerebral palsy: without significant change in status; will continue namenda xr 28 mg daily; will continue exelon patch 4.6 mg daily will monitor asa 81 mg daily   4. 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  Ok Edwards NP [facility]  Contact [phone] Monday through Friday 8am- 5pm  After hours call [phone]

## 2015-06-17 ENCOUNTER — Non-Acute Institutional Stay (SKILLED_NURSING_FACILITY): Payer: Medicare Other | Admitting: Adult Health

## 2015-06-17 ENCOUNTER — Encounter: Payer: Self-pay | Admitting: Adult Health

## 2015-06-17 DIAGNOSIS — K5901 Slow transit constipation: Secondary | ICD-10-CM | POA: Diagnosis not present

## 2015-06-17 DIAGNOSIS — F01518 Vascular dementia, unspecified severity, with other behavioral disturbance: Secondary | ICD-10-CM

## 2015-06-17 DIAGNOSIS — G2571 Drug induced akathisia: Secondary | ICD-10-CM | POA: Insufficient documentation

## 2015-06-17 DIAGNOSIS — T43595A Adverse effect of other antipsychotics and neuroleptics, initial encounter: Secondary | ICD-10-CM

## 2015-06-17 DIAGNOSIS — F323 Major depressive disorder, single episode, severe with psychotic features: Secondary | ICD-10-CM

## 2015-06-17 DIAGNOSIS — G809 Cerebral palsy, unspecified: Secondary | ICD-10-CM

## 2015-06-17 DIAGNOSIS — S72002S Fracture of unspecified part of neck of left femur, sequela: Secondary | ICD-10-CM

## 2015-06-17 DIAGNOSIS — G2589 Other specified extrapyramidal and movement disorders: Secondary | ICD-10-CM

## 2015-06-17 DIAGNOSIS — F0151 Vascular dementia with behavioral disturbance: Secondary | ICD-10-CM | POA: Diagnosis not present

## 2015-06-17 DIAGNOSIS — T43505A Adverse effect of unspecified antipsychotics and neuroleptics, initial encounter: Secondary | ICD-10-CM

## 2015-06-17 NOTE — Progress Notes (Addendum)
Patient ID: Loraine LericheBarry W Manzo, male   DOB: May 08, 1960, 55 y.o.   MRN: [id number]   [facility] Room Number: 113-B Place of Service:  SNF (31)   CODE STATUS: DNR  No Known Allergies  Chief Complaint  Patient presents with  . Medical Management of Chronic Issues    Follow up    HPI:  He is a long term resident of this facility being seen for the management of his chronic illnesses. He continues to have verbal outbursts; is restless has hit his head on the side railing; will scoot himself on the floor. The staff is unable to console him at times.  I have spoken with the psych NP regarding his status. The thought is he could be experiencing akathisia.     Past Medical History  Diagnosis Date  . Cerebral palsy (HCC)     bed bound status  . Dementia   . HTN (hypertension)   . Depression   . Vitamin B 12 deficiency   . Hyperthyroidism     Past Surgical History  Procedure Laterality Date  . Dental procedure    . Foot surgeries      bilateral for cerebral palsy  . Total hip arthroplasty Right 02/09/2015    Procedure: TOTAL HIP ARTHROPLASTY ANTERIOR APPROACH;  Surgeon: Kennedy BuckerMichael Menz, MD;  Location: [facility] ORS;  Service: Orthopedics;  Laterality: Right;    Social History   Social History  . Marital Status: Single    Spouse Name: N/A  . Number of Children: N/A  . Years of Education: N/A   Occupational History  . Not on file.   Social History Main Topics  . Smoking status: Never Smoker   . Smokeless tobacco: Not on file  . Alcohol Use: No  . Drug Use: No  . Sexual Activity: No   Other Topics Concern  . Not on file   Social History Narrative   [facility]. Bedbound now and minimal verbal communication.   Family History  Problem Relation Age of Onset  . Dementia Mother   . CAD Brother   . Aortic aneurysm Brother   . Aortic aneurysm Father   . Kidney cancer Neg Hx   . Kidney disease Neg Hx   . Prostate cancer  Neg Hx       VITAL SIGNS BP 106/67 mmHg  Pulse 60  Temp(Src) 97.5 F (36.4 C) (Oral)  Resp 18  Ht 5\' 7"  (1.702 m)  Wt 109 lb (49.442 kg)  BMI 17.07 kg/m2  SpO2 96%  Patient's Medications  New Prescriptions   No medications on file  Previous Medications   ACETAMINOPHEN (TYLENOL) 325 MG TABLET    Take 650 mg by mouth every 8 (eight) hours as needed for mild pain, fever or headache.    ASPIRIN EC 81 MG TABLET    Take 81 mg by mouth every evening.   DESMOPRESSIN (DDAVP) 0.1 MG TABLET    Take 2.5 mg by mouth at bedtime.    DOCUSATE SODIUM (COLACE) 100 MG CAPSULE    Take 1 capsule (100 mg total) by mouth 2 (two) times daily.   ESCITALOPRAM (LEXAPRO) 20 MG TABLET    Take 20 mg by mouth daily.   HYDROCORTISONE (ANUSOL-HC) 25 MG SUPPOSITORY    Place 25 mg rectally 2 (two) times daily.   LORAZEPAM (ATIVAN) 0.5 MG TABLET    Take 1 tablet (0.5 mg total) by mouth 2 (two) times daily as  needed for anxiety.   MEMANTINE (NAMENDA XR) 28 MG CP24 24 HR CAPSULE    Take 28 mg by mouth every evening.   MIRTAZAPINE (REMERON) 15 MG TABLET    Take 15 mg by mouth every evening.    OXYCODONE (OXY IR/ROXICODONE) 5 MG IMMEDIATE RELEASE TABLET    Take 1-2 tablets (5-10 mg total) by mouth every 4 (four) hours as needed for breakthrough pain.   POLYETHYLENE GLYCOL (MIRALAX / GLYCOLAX) PACKET    Take 17 g by mouth daily.   RISPERIDONE (RISPERDAL) 1 MG TABLET    Take 1 mg by mouth 3 (three) times daily.   RIVASTIGMINE (EXELON) 4.6 MG/24HR    Place onto the skin daily.   Modified Medications   No medications on file  Discontinued Medications   DEXTROMETHORPHAN-QUINIDINE 20-10 MG CAPS    Take 1 tablet by mouth 2 (two) times daily. Reported on 06/17/2015     SIGNIFICANT DIAGNOSTIC EXAMS    02-06-15: ct of abdomen and pelvis: 1. Massive fecal impaction with extensive stool throughout the olon. 2. 12 mm mass on the lower pole of the left kidney worrisome for a small malignancy. 3. Probable cholelithiasis. 4.  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  02-06-15: ct of head: No acute intracranial process. Mega cisterna magna. Mild cortical volume loss.  02-06-15: right femur x-ray: Displaced/comminuted fracture of the right femoral neck, centered within the subcapital region but with extension into lateral portion of the right femoral head. At least some degree of associated impaction at the fracture site. Main component of the femoral head remains well positioned relative to the acetabulum.  02-06-15: right fibula/tibia x-ray: No acute abnormality. Atrophic musculature right lower leg. Talonavicular degenerative change.   02-06-15: chest x-ray: Mild left base subsegmental atelectasis .  02-07-15: renal ultrasound: 1. Subtle isoechoic-to-hyperechoic mass within the lower pole of the LEFT kidney, measuring 2.3 x 2 cm, not adequately visualized for definitive characterization, a possible correlate for the 1.2 cm mass seen on recent CT. Recommend more definitive characterization with either renal protocol CT (with and without contrast) or renal protocol MRI. 2. Right kidney appears normal. 3. Probable debris within the dependent aspects of the bladder. Bladder otherwise unremarkable  02-08-15; kub: Findings are compatible with the given history of constipation. Large stool ball distending the rectal vault, measuring at least 11 x 9 cm. Large amount of stool throughout the colon.  05-05-15: right hip fracture: no acute process    LABS REVIEWED:   02-06-15: wbc 6.3; hgb 15.2; hct 46.1; mcv 90.7; plt 281; glucose 96; bun 9; creat 0.55; k+ 3.9; na++ 139; liver normal albumin 3.5 02-07-15: wbc 7.6; hgb 14.8; hct 44.0; mcv 94.0; plt 259; glucose 100; bun 10; creat 0.83; k+ 3.9; na++141 02-12-15: wbc 5.3; hgb 11.8; hct 34.8; mcv 90.5; plt 151; glucose 110; bun 10; creat 0.51; k+ 3.4; na++137  04-03-15: glucose 92; bun 13; creat 0.63; k+ 4.3; na++139     Review of Systems  Unable to perform ROS:  dementia      Physical Exam  Constitutional: No distress.  Frail   Eyes: Conjunctivae are normal.  Neck: Neck supple. No JVD present. No thyromegaly present.  Cardiovascular: Normal rate, regular rhythm and intact distal pulses.   Respiratory: Effort normal and breath sounds normal. No respiratory distress. He has no wheezes.  GI: Soft. Bowel sounds are normal. He exhibits no distension. There is no tenderness.  Musculoskeletal: He exhibits no edema.  Able to  move all extremities  Is status post right femur fracture Lymphadenopathy:    He has no cervical adenopathy.  Neurological:  Is aware of surroundings Is restless;    Skin: Skin is warm and dry. He is not diaphoretic.     ASSESSMENT/PLAN  1. Left femoral head fracture: will follow up with orthopedics as indicated; will continue oxycodone 5 or 10 mg every 4 hours as needed; will monitor his status.   2. Major depression with psychotic features: will continue lexapro 20 mg daily remeron 15 mg nightly will lower his risperdal to 1 mg twice daily due to akathisia; will stop the ativan and will begin klonopin 0.25 mg in the AM to help with anxiety and akathisia. Will monitor his status.    3. Vascular dementia: has cerebral palsy: without significant change in status; will continue namenda xr 28 mg daily; will continue exelon patch 4.6 mg daily will monitor asa 81 mg daily   4. Constipation: will continue miralax daily colace twice daily   Will reduce his ddavp to 0.1 mg nightly; I cannot find a reason for him to be on this medication; if he tolerates this wean will continue to lower the dose.  In one week will check cbc; cmp  hgb a1c    Synthia Innocent NP [facility]  Contact [phone] Monday through Friday 8am- 5pm  After hours call [phone]

## 2015-06-24 LAB — HEPATIC FUNCTION PANEL
ALT: 4 U/L — AB (ref 10–40)
AST: 9 U/L — AB (ref 14–40)
Alkaline Phosphatase: 94 U/L (ref 25–125)
Bilirubin, Total: 0.3 mg/dL

## 2015-06-24 LAB — BASIC METABOLIC PANEL
BUN: 13 mg/dL (ref 4–21)
CREATININE: 0.6 mg/dL (ref 0.6–1.3)
GLUCOSE: 119 mg/dL
Potassium: 4.1 mmol/L (ref 3.4–5.3)
Sodium: 137 mmol/L (ref 137–147)

## 2015-06-24 LAB — CBC AND DIFFERENTIAL
HCT: 44 % (ref 41–53)
Hemoglobin: 14.5 g/dL (ref 13.5–17.5)
Neutrophils Absolute: 3477 /uL
PLATELETS: 244 10*3/uL (ref 150–399)
WBC: 5.7 10*3/mL

## 2015-06-25 ENCOUNTER — Ambulatory Visit: Payer: Medicare Other | Admitting: Urology

## 2015-07-10 ENCOUNTER — Ambulatory Visit (HOSPITAL_COMMUNITY)
Admission: RE | Admit: 2015-07-10 | Discharge: 2015-07-10 | Disposition: A | Discharge status: Home / Self Care | Payer: Medicare Other | Source: Ambulatory Visit | Attending: Urology | Admitting: Urology

## 2015-07-10 DIAGNOSIS — N2889 Other specified disorders of kidney and ureter: Secondary | ICD-10-CM

## 2015-07-14 ENCOUNTER — Ambulatory Visit (INDEPENDENT_AMBULATORY_CARE_PROVIDER_SITE_OTHER): Payer: Medicare Other | Admitting: Urology

## 2015-07-14 ENCOUNTER — Encounter: Payer: Self-pay | Admitting: Urology

## 2015-07-14 VITALS — BP 122/75 | HR 93

## 2015-07-14 DIAGNOSIS — N2889 Other specified disorders of kidney and ureter: Secondary | ICD-10-CM

## 2015-07-14 NOTE — Progress Notes (Signed)
07/14/2015 10:56 AM   Loraine LericheBarry W Dauphin 1961/01/15 [id number]  Referring provider: No referring provider defined for this encounter.  Chief Complaint  Patient presents with  . Results    HPI: Mr Maurice Clayton is a 55yo seen today for followup for left renal mass. He underwent renal US which showed a 2cm left lower pole renal mass which has increased since his CT scan. CT showed 1.2cm mass.  He has CP and dementia. He had his hip fracture repaired in 02/2015.     PMH: Past Medical History  Diagnosis Date  . Cerebral palsy (HCC)     bed bound status  . Dementia   . HTN (hypertension)   . Depression   . Vitamin B 12 deficiency   . Hyperthyroidism     Surgical History: Past Surgical History  Procedure Laterality Date  . Dental procedure    . Foot surgeries      bilateral for cerebral palsy  . Total hip arthroplasty Right 02/09/2015    Procedure: TOTAL HIP ARTHROPLASTY ANTERIOR APPROACH;  Surgeon: Kennedy BuckerMichael Menz, MD;  Location: [facility] ORS;  Service: Orthopedics;  Laterality: Right;    Home Medications:    Medication List       This list is accurate as of: 07/14/15 10:56 AM.  Always use your most recent med list.               acetaminophen 325 MG tablet  Commonly known as:  TYLENOL  Take 650 mg by mouth every 8 (eight) hours as needed for mild pain, fever or headache.     aspirin EC 81 MG tablet  Take 81 mg by mouth every evening.     clonazePAM 0.5 MG tablet  Commonly known as:  KLONOPIN  Take 0.5 mg by mouth 2 (two) times daily as needed for anxiety.     desmopressin 0.1 MG tablet  Commonly known as:  DDAVP  Take 2.5 mg by mouth at bedtime.     docusate sodium 100 MG capsule  Commonly known as:  COLACE  Take 1 capsule (100 mg total) by mouth 2 (two) times daily.     escitalopram 20 MG tablet  Commonly known as:  LEXAPRO  Take 20 mg by mouth daily.     EXELON 4.6 mg/24hr  Generic drug:  rivastigmine  Place onto the skin daily.     hydrocortisone 25 MG  suppository  Commonly known as:  ANUSOL-HC  Place 25 mg rectally 2 (two) times daily.     mirtazapine 15 MG tablet  Commonly known as:  REMERON  Take 15 mg by mouth every evening.     NAMENDA XR 28 MG Cp24 24 hr capsule  Generic drug:  memantine  Take 28 mg by mouth every evening.     oxyCODONE 5 MG immediate release tablet  Commonly known as:  Oxy IR/ROXICODONE  Take 1-2 tablets (5-10 mg total) by mouth every 4 (four) hours as needed for breakthrough pain.     polyethylene glycol packet  Commonly known as:  MIRALAX / GLYCOLAX  Take 17 g by mouth daily.     risperiDONE 1 MG tablet  Commonly known as:  RISPERDAL  Take 1 mg by mouth 3 (three) times daily.        Allergies: No Known Allergies  Family History: Family History  Problem Relation Age of Onset  . Dementia Mother   . CAD Brother   . Aortic aneurysm Brother   . Aortic aneurysm Father   .  Kidney cancer Neg Hx   . Kidney disease Neg Hx   . Prostate cancer Neg Hx     Social History:  reports that he has never smoked. He does not have any smokeless tobacco history on file. He reports that he does not drink alcohol or use illicit drugs.  ROS:                                        Physical Exam: BP 122/75 mmHg  Pulse 93  Constitutional:  Alert and oriented, No acute distress. HEENT: [place] AT, moist mucus membranes.  Trachea midline, no masses. Cardiovascular: No clubbing, cyanosis, or edema. Respiratory: Normal respiratory effort, no increased work of breathing. GI: Abdomen is soft, nontender, nondistended, no abdominal masses GU: No CVA tenderness.  Skin: No rashes, bruises or suspicious lesions. Lymph: No cervical or inguinal adenopathy. Neurologic: Grossly intact, no focal deficits, moving all 4 extremities. Psychiatric: Normal mood and affect.  Laboratory Data: Lab Results  Component Value Date   WBC 5.3 02/12/2015   HGB 14.9 03/20/2015   HCT 45 03/20/2015   MCV 90.5  02/12/2015   PLT 236 03/20/2015    Lab Results  Component Value Date   CREATININE 0.6 04/03/2015    No results found for: PSA  No results found for: TESTOSTERONE  No results found for: HGBA1C  Urinalysis    Component Value Date/Time   COLORURINE YELLOW* 02/06/2015 1325   APPEARANCEUR CLEAR* 02/06/2015 1325   LABSPEC 1.011 02/06/2015 1325   PHURINE 6.0 02/06/2015 1325   GLUCOSEU NEGATIVE 02/06/2015 1325   HGBUR NEGATIVE 02/06/2015 1325   BILIRUBINUR NEGATIVE 02/06/2015 1325   KETONESUR NEGATIVE 02/06/2015 1325   PROTEINUR NEGATIVE 02/06/2015 1325   NITRITE NEGATIVE 02/06/2015 1325   LEUKOCYTESUR NEGATIVE 02/06/2015 1325    Pertinent Imaging: Renal US  Assessment & Plan:   1. Left renal mass -CT abd/pelvis w/wo in 6 months -BMP in 6 months  There are no diagnoses linked to this encounter.  No Follow-up on file.  Wilkie Aye, MD  [facility] [address], [address] [place], [place] [address] [phone]

## 2015-07-16 ENCOUNTER — Encounter: Payer: Self-pay | Admitting: Adult Health

## 2015-07-16 ENCOUNTER — Non-Acute Institutional Stay (SKILLED_NURSING_FACILITY): Payer: Medicare Other | Admitting: Adult Health

## 2015-07-16 DIAGNOSIS — T43595A Adverse effect of other antipsychotics and neuroleptics, initial encounter: Secondary | ICD-10-CM

## 2015-07-16 DIAGNOSIS — G300 Alzheimer's disease with early onset: Secondary | ICD-10-CM

## 2015-07-16 DIAGNOSIS — G808 Other cerebral palsy: Secondary | ICD-10-CM

## 2015-07-16 DIAGNOSIS — F323 Major depressive disorder, single episode, severe with psychotic features: Secondary | ICD-10-CM

## 2015-07-16 DIAGNOSIS — G2589 Other specified extrapyramidal and movement disorders: Secondary | ICD-10-CM

## 2015-07-16 DIAGNOSIS — T43505A Adverse effect of unspecified antipsychotics and neuroleptics, initial encounter: Secondary | ICD-10-CM

## 2015-07-16 DIAGNOSIS — F0280 Dementia in other diseases classified elsewhere without behavioral disturbance: Secondary | ICD-10-CM

## 2015-07-16 DIAGNOSIS — G2571 Drug induced akathisia: Secondary | ICD-10-CM

## 2015-07-16 DIAGNOSIS — N2889 Other specified disorders of kidney and ureter: Secondary | ICD-10-CM | POA: Diagnosis not present

## 2015-07-16 DIAGNOSIS — K5901 Slow transit constipation: Secondary | ICD-10-CM

## 2015-07-16 DIAGNOSIS — S72002S Fracture of unspecified part of neck of left femur, sequela: Secondary | ICD-10-CM

## 2015-07-16 NOTE — Progress Notes (Signed)
Patient ID: Maurice LericheBarry W Clayton, male   DOB: [birth year], 55 y.o.   MRN: [id number]    [facility] Room Number: 113-B Place of Service:  SNF (31)   CODE STATUS: DNR  No Known Allergies  Chief Complaint  Patient presents with  . Medical Management of Chronic Issues    Follow up    HPI:  He is a long term resident of this facility being seen for the management of his chronic illnesses. He has been seen by urology for his left renal mass. He will follow up in 6 months with  repeat scan and blood work. He is unable to participate in the hpi or ros. There are no nursing concerns at this time.   Past Medical History  Diagnosis Date  . Cerebral palsy (HCC)     bed bound status  . Dementia   . HTN (hypertension)   . Depression   . Vitamin B 12 deficiency   . Hyperthyroidism     Past Surgical History  Procedure Laterality Date  . Dental procedure    . Foot surgeries      bilateral for cerebral palsy  . Total hip arthroplasty Right 02/09/2015    Procedure: TOTAL HIP ARTHROPLASTY ANTERIOR APPROACH;  Surgeon: Kennedy BuckerMichael Menz, MD;  Location: [facility] ORS;  Service: Orthopedics;  Laterality: Right;    Social History   Social History  . Marital Status: Single    Spouse Name: N/A  . Number of Children: N/A  . Years of Education: N/A   Occupational History  . Not on file.   Social History Main Topics  . Smoking status: Never Smoker   . Smokeless tobacco: Not on file  . Alcohol Use: No  . Drug Use: No  . Sexual Activity: No   Other Topics Concern  . Not on file   Social History Narrative   [facility]. Bedbound now and minimal verbal communication.   Family History  Problem Relation Age of Onset  . Dementia Mother   . CAD Brother   . Aortic aneurysm Brother   . Aortic aneurysm Father   . Kidney cancer Neg Hx   . Kidney disease Neg Hx   . Prostate cancer Neg Hx       VITAL SIGNS BP 124/66 mmHg  Pulse 62  Temp(Src)  97.6 F (36.4 C) (Oral)  Resp 18  Ht 5\' 7"  (1.702 m)  Wt 118 lb 9 oz (53.78 kg)  BMI 18.57 kg/m2  SpO2 96%  Patient's Medications  New Prescriptions   No medications on file  Previous Medications   ACETAMINOPHEN (TYLENOL) 325 MG TABLET    Take 325 mg by mouth every 6 (six) hours as needed for mild pain, fever or headache.    ASPIRIN EC 81 MG TABLET    Take 81 mg by mouth every evening.   CLONAZEPAM (KLONOPIN) 0.25 MG DISINTEGRATING TABLET    Take 0.25 mg by mouth every morning.   DESMOPRESSIN (DDAVP) 0.1 MG TABLET    Take 0.1 mg by mouth at bedtime.    DOCUSATE SODIUM (COLACE) 100 MG CAPSULE    Take 1 capsule (100 mg total) by mouth 2 (two) times daily.   ESCITALOPRAM (LEXAPRO) 20 MG TABLET    Take 20 mg by mouth daily.   MEMANTINE (NAMENDA XR) 14 MG CP24 24 HR CAPSULE    Take 14 mg by mouth daily.   MIRTAZAPINE (REMERON) 15 MG TABLET  Take 15 mg by mouth every evening.    OXYCODONE (OXY IR/ROXICODONE) 5 MG IMMEDIATE RELEASE TABLET    Take 1-2 tablets (5-10 mg total) by mouth every 4 (four) hours as needed for breakthrough pain.   POLYETHYLENE GLYCOL (MIRALAX / GLYCOLAX) PACKET    Take 17 g by mouth daily.   RISPERIDONE (RISPERDAL) 1 MG TABLET    Take 1 mg by mouth 2 (two) times daily.    RIVASTIGMINE (EXELON) 4.6 MG/24HR    Place onto the skin daily.   Modified Medications   No medications on file  Discontinued Medications   CLONAZEPAM (KLONOPIN) 0.5 MG TABLET    Take 0.5 mg by mouth 2 (two) times daily as needed for anxiety. Reported on 07/16/2015   HYDROCORTISONE (ANUSOL-HC) 25 MG SUPPOSITORY    Place 25 mg rectally 2 (two) times daily. Reported on 07/16/2015   MEMANTINE (NAMENDA XR) 28 MG CP24 24 HR CAPSULE    Take 28 mg by mouth every evening. Reported on 07/16/2015     SIGNIFICANT DIAGNOSTIC EXAMS  02-06-15: ct of abdomen and pelvis: 1. Massive fecal impaction with extensive stool throughout the olon. 2. 12 mm mass on the lower pole of the left kidney worrisome for a small  malignancy. 3. Probable cholelithiasis. 4. 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  02-06-15: ct of head: No acute intracranial process. Mega cisterna magna. Mild cortical volume loss.  02-06-15: right femur x-ray: Displaced/comminuted fracture of the right femoral neck, centered within the subcapital region but with extension into lateral portion of the right femoral head. At least some degree of associated impaction at the fracture site. Main component of the femoral head remains well positioned relative to the acetabulum.  02-06-15: right fibula/tibia x-ray: No acute abnormality. Atrophic musculature right lower leg. Talonavicular degenerative change.   02-06-15: chest x-ray: Mild left base subsegmental atelectasis .  02-07-15: renal ultrasound: 1. Subtle isoechoic-to-hyperechoic mass within the lower pole of the LEFT kidney, measuring 2.3 x 2 cm, not adequately visualized for definitive characterization, a possible correlate for the 1.2 cm mass seen on recent CT. Recommend more definitive characterization with either renal protocol CT (with and without contrast) or renal protocol MRI. 2. Right kidney appears normal. 3. Probable debris within the dependent aspects of the bladder. Bladder otherwise unremarkable  02-08-15; kub: Findings are compatible with the given history of constipation. Large stool ball distending the rectal vault, measuring at least 11 x 9 cm. Large amount of stool throughout the colon.  05-05-15: right hip fracture: no acute process    LABS REVIEWED:   02-06-15: wbc 6.3; hgb 15.2; hct 46.1; mcv 90.7; plt 281; glucose 96; bun 9; creat 0.55; k+ 3.9; na++ 139; liver normal albumin 3.5 02-07-15: wbc 7.6; hgb 14.8; hct 44.0; mcv 94.0; plt 259; glucose 100; bun 10; creat 0.83; k+ 3.9; na++141 02-12-15: wbc 5.3; hgb 11.8; hct 34.8; mcv 90.5; plt 151; glucose 110; bun 10; creat 0.51; k+ 3.4; na++137  04-03-15: glucose 92; bun 13; creat 0.63; k+ 4.3; na++139    06-24-15: wbc 5.7; hgb 14.5; hct 43.7; mcv 91.0; plt 244; glucose 119; bun 13; creat 0.65; k+ 4.1; na++ 137; liver normal albumin 3.7; hgb a1c 5.4     Review of Systems  Unable to perform ROS: dementia      Physical Exam  Constitutional: No distress.  Frail   Eyes: Conjunctivae are normal.  Neck: Neck supple. No JVD present. No thyromegaly present.  Cardiovascular: Normal rate, regular  rhythm and intact distal pulses.   Respiratory: Effort normal and breath sounds normal. No respiratory distress. He has no wheezes.  GI: Soft. Bowel sounds are normal. He exhibits no distension. There is no tenderness.  Musculoskeletal: He exhibits no edema.  Able to move all extremities  Is status post right femur fracture Jan 2017  Lymphadenopathy:    He has no cervical adenopathy.  Neurological:  Is aware of surroundings Is less restless;    Skin: Skin is warm and dry. He is not diaphoretic.     ASSESSMENT/PLAN  1. Left femoral head fracture: (jan 2017) will continue oxycodone 5 or 10 mg every 4 hours as needed; will monitor his status.   2. Major depression with psychotic features: will continue lexapro 20 mg daily remeron 15 mg nightly will continue risperdal  1 mg twice daily due to akathisia; will continue  klonopin 0.25 mg in the AM to help with anxiety and akathisia. Will monitor his status.    3. Vascular dementia: has cerebral palsy: without significant change in status; will continue namenda xr 28 mg daily; will continue exelon patch 4.6 mg daily will monitor asa 81 mg daily  His current weight is 119 pounds   4. Constipation: will continue miralax daily colace twice daily   5. Left renal mass: is being followed by urology; will monitor     Synthia Innocent NP [facility]  Contact [phone] Monday through Friday 8am- 5pm  After hours call [phone]

## 2015-08-07 ENCOUNTER — Encounter: Payer: Self-pay | Admitting: Adult Health

## 2015-08-07 ENCOUNTER — Non-Acute Institutional Stay (SKILLED_NURSING_FACILITY): Payer: Medicare Other | Admitting: Adult Health

## 2015-08-07 DIAGNOSIS — G2589 Other specified extrapyramidal and movement disorders: Secondary | ICD-10-CM | POA: Diagnosis not present

## 2015-08-07 DIAGNOSIS — G300 Alzheimer's disease with early onset: Secondary | ICD-10-CM | POA: Diagnosis not present

## 2015-08-07 DIAGNOSIS — F323 Major depressive disorder, single episode, severe with psychotic features: Secondary | ICD-10-CM

## 2015-08-07 DIAGNOSIS — G808 Other cerebral palsy: Secondary | ICD-10-CM | POA: Diagnosis not present

## 2015-08-07 DIAGNOSIS — G2571 Drug induced akathisia: Secondary | ICD-10-CM

## 2015-08-07 DIAGNOSIS — T43505A Adverse effect of unspecified antipsychotics and neuroleptics, initial encounter: Secondary | ICD-10-CM

## 2015-08-07 DIAGNOSIS — F0280 Dementia in other diseases classified elsewhere without behavioral disturbance: Secondary | ICD-10-CM

## 2015-08-07 DIAGNOSIS — Y92129 Unspecified place in nursing home as the place of occurrence of the external cause: Secondary | ICD-10-CM

## 2015-08-07 DIAGNOSIS — T43595A Adverse effect of other antipsychotics and neuroleptics, initial encounter: Secondary | ICD-10-CM

## 2015-08-07 DIAGNOSIS — W19XXXA Unspecified fall, initial encounter: Secondary | ICD-10-CM

## 2015-08-07 NOTE — Progress Notes (Signed)
Patient ID: Maurice Clayton, male   DOB: Feb 24, 1960, 55 y.o.   MRN: [id number]   Location:   [facility] Room Number: 113-B Place of Service:  SNF (31)   CODE STATUS: DNR  No Known Allergies  Chief Complaint  Patient presents with  . Acute Visit    Falls    HPI:  In the past 2 weeks he has 5 falls. He is more verbally agitated with yelling out present; there are times when he cannot be redirected or consoled. Staff reports that he hs more agitated while in bed as well.   Past Medical History  Diagnosis Date  . Cerebral palsy (HCC)     bed bound status  . Dementia   . HTN (hypertension)   . Depression   . Vitamin B 12 deficiency   . Hyperthyroidism     Past Surgical History  Procedure Laterality Date  . Dental procedure    . Foot surgeries      bilateral for cerebral palsy  . Total hip arthroplasty Right 02/09/2015    Procedure: TOTAL HIP ARTHROPLASTY ANTERIOR APPROACH;  Surgeon: Kennedy BuckerMichael Menz, MD;  Location: [facility] ORS;  Service: Orthopedics;  Laterality: Right;    Social History   Social History  . Marital Status: Single    Spouse Name: N/A  . Number of Children: N/A  . Years of Education: N/A   Occupational History  . Not on file.   Social History Main Topics  . Smoking status: Never Smoker   . Smokeless tobacco: Not on file  . Alcohol Use: No  . Drug Use: No  . Sexual Activity: No   Other Topics Concern  . Not on file   Social History Narrative   [facility]. Bedbound now and minimal verbal communication.   Family History  Problem Relation Age of Onset  . Dementia Mother   . CAD Brother   . Aortic aneurysm Brother   . Aortic aneurysm Father   . Kidney cancer Neg Hx   . Kidney disease Neg Hx   . Prostate cancer Neg Hx       VITAL SIGNS BP 103/64 mmHg  Pulse 72  Temp(Src) 97.6 F (36.4 C) (Oral)  Resp 18  Ht 5\' 7"  (1.702 m)  Wt 114 lb 4 oz (51.823 kg)  BMI 17.89 kg/m2  SpO2 97%  Patient's Medications  New  Prescriptions   No medications on file  Previous Medications   ACETAMINOPHEN (TYLENOL) 325 MG TABLET    Take 325 mg by mouth every 6 (six) hours as needed for mild pain, fever or headache.    ASPIRIN EC 81 MG TABLET    Take 81 mg by mouth every evening.   CLONAZEPAM (KLONOPIN) 0.25 MG DISINTEGRATING TABLET    Take 0.25 mg by mouth every morning.   DESMOPRESSIN (DDAVP) 0.1 MG TABLET    Take 0.1 mg by mouth at bedtime.    DOCUSATE SODIUM (COLACE) 100 MG CAPSULE    Take 1 capsule (100 mg total) by mouth 2 (two) times daily.   ESCITALOPRAM (LEXAPRO) 20 MG TABLET    Take 20 mg by mouth daily.   HYDROCORTISONE (ANUSOL-HC) 25 MG SUPPOSITORY    Place 25 mg rectally 2 (two) times daily.   MEMANTINE (NAMENDA XR) 14 MG CP24 24 HR CAPSULE    Take 14 mg by mouth daily.   MIRTAZAPINE (REMERON) 15 MG TABLET    Take 15 mg by mouth every evening.  OXYCODONE (OXY IR/ROXICODONE) 5 MG IMMEDIATE RELEASE TABLET    Take 1-2 tablets (5-10 mg total) by mouth every 4 (four) hours as needed for breakthrough pain.   POLYETHYLENE GLYCOL (MIRALAX / GLYCOLAX) PACKET    Take 17 g by mouth daily.   RISPERIDONE (RISPERDAL) 1 MG TABLET    Take 1 mg by mouth 2 (two) times daily.    RIVASTIGMINE (EXELON) 4.6 MG/24HR    Place onto the skin daily.   Modified Medications   No medications on file  Discontinued Medications   No medications on file     SIGNIFICANT DIAGNOSTIC EXAMS  02-06-15: ct of abdomen and pelvis: 1. Massive fecal impaction with extensive stool throughout the olon. 2. 12 mm mass on the lower pole of the left kidney worrisome for a small malignancy. 3. Probable cholelithiasis. 4. 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  02-06-15: ct of head: No acute intracranial process. Mega cisterna magna. Mild cortical volume loss.  02-06-15: right femur x-ray: Displaced/comminuted fracture of the right femoral neck, centered within the subcapital region but with extension into lateral portion of  the right femoral head. At least some degree of associated impaction at the fracture site. Main component of the femoral head remains well positioned relative to the acetabulum.  02-06-15: right fibula/tibia x-ray: No acute abnormality. Atrophic musculature right lower leg. Talonavicular degenerative change.   02-06-15: chest x-ray: Mild left base subsegmental atelectasis .  02-07-15: renal ultrasound: 1. Subtle isoechoic-to-hyperechoic mass within the lower pole of the LEFT kidney, measuring 2.3 x 2 cm, not adequately visualized for definitive characterization, a possible correlate for the 1.2 cm mass seen on recent CT. Recommend more definitive characterization with either renal protocol CT (with and without contrast) or renal protocol MRI. 2. Right kidney appears normal. 3. Probable debris within the dependent aspects of the bladder. Bladder otherwise unremarkable  02-08-15; kub: Findings are compatible with the given history of constipation. Large stool ball distending the rectal vault, measuring at least 11 x 9 cm. Large amount of stool throughout the colon.  05-05-15: right hip fracture: no acute process    LABS REVIEWED:   02-06-15: wbc 6.3; hgb 15.2; hct 46.1; mcv 90.7; plt 281; glucose 96; bun 9; creat 0.55; k+ 3.9; na++ 139; liver normal albumin 3.5 02-07-15: wbc 7.6; hgb 14.8; hct 44.0; mcv 94.0; plt 259; glucose 100; bun 10; creat 0.83; k+ 3.9; na++141 02-12-15: wbc 5.3; hgb 11.8; hct 34.8; mcv 90.5; plt 151; glucose 110; bun 10; creat 0.51; k+ 3.4; na++137  04-03-15: glucose 92; bun 13; creat 0.63; k+ 4.3; na++139  06-24-15: wbc 5.7; hgb 14.5; hct 43.7; mcv 91.0; plt 244; glucose 119; bun 13; creat 0.65; k+ 4.1; na++ 137; liver normal albumin 3.7; hgb a1c 5.4     Review of Systems  Unable to perform ROS: dementia      Physical Exam  Constitutional: No distress.  Frail   Eyes: Conjunctivae are normal.  Neck: Neck supple. No JVD present. No thyromegaly present.  Cardiovascular: Normal  rate, regular rhythm and intact distal pulses.   Respiratory: Effort normal and breath sounds normal. No respiratory distress. He has no wheezes.  GI: Soft. Bowel sounds are normal. He exhibits no distension. There is no tenderness.  Musculoskeletal: He exhibits no edema.  Able to move all extremities  Is status post right femur fracture Jan 2017  Lymphadenopathy:    He has no cervical adenopathy.  Neurological:  Is aware of surroundings Is less  restless;    Skin: Skin is warm and dry. He is not diaphoretic.     ASSESSMENT/PLAN  1. Major depression with psychotic features: will continue lexapro 20 mg daily remeron 15 mg nightly will continue risperdal  1 mg twice daily due to akathisia; will continue  klonopin 0.25 mg in the AM to help with anxiety and akathisia. Will monitor his status.    2. Vascular dementia: has cerebral palsy: without significant change in status; will continue namenda xr 28 mg daily;  asa 81 mg daily   Will return his exelon patch back to 4.6 mg daily; he has not tolerated the higher dose   Will monitor  His current weight is 114 pounds   3. Frequent fall: will lower his exelon and will continue to monitor his status.     Synthia Innocenteborah Green NP [facility]  Contact [phone] Monday through Friday 8am- 5pm  After hours call [phone]

## 2015-08-17 ENCOUNTER — Encounter: Payer: Self-pay | Admitting: Adult Health

## 2015-08-17 ENCOUNTER — Non-Acute Institutional Stay (SKILLED_NURSING_FACILITY): Payer: Medicare Other | Admitting: Adult Health

## 2015-08-17 DIAGNOSIS — F323 Major depressive disorder, single episode, severe with psychotic features: Secondary | ICD-10-CM

## 2015-08-17 DIAGNOSIS — T43595A Adverse effect of other antipsychotics and neuroleptics, initial encounter: Secondary | ICD-10-CM | POA: Diagnosis not present

## 2015-08-17 DIAGNOSIS — F0280 Dementia in other diseases classified elsewhere without behavioral disturbance: Secondary | ICD-10-CM

## 2015-08-17 DIAGNOSIS — G2571 Drug induced akathisia: Secondary | ICD-10-CM

## 2015-08-17 DIAGNOSIS — G2589 Other specified extrapyramidal and movement disorders: Secondary | ICD-10-CM | POA: Diagnosis not present

## 2015-08-17 DIAGNOSIS — W19XXXD Unspecified fall, subsequent encounter: Secondary | ICD-10-CM

## 2015-08-17 DIAGNOSIS — Y92129 Unspecified place in nursing home as the place of occurrence of the external cause: Secondary | ICD-10-CM

## 2015-08-17 DIAGNOSIS — G808 Other cerebral palsy: Secondary | ICD-10-CM | POA: Diagnosis not present

## 2015-08-17 DIAGNOSIS — T43505A Adverse effect of unspecified antipsychotics and neuroleptics, initial encounter: Secondary | ICD-10-CM

## 2015-08-17 DIAGNOSIS — G300 Alzheimer's disease with early onset: Secondary | ICD-10-CM | POA: Diagnosis not present

## 2015-08-17 DIAGNOSIS — W19XXXA Unspecified fall, initial encounter: Secondary | ICD-10-CM | POA: Insufficient documentation

## 2015-08-17 DIAGNOSIS — S72002S Fracture of unspecified part of neck of left femur, sequela: Secondary | ICD-10-CM | POA: Diagnosis not present

## 2015-08-17 NOTE — Progress Notes (Signed)
Patient ID: Maurice LericheBarry W Clayton, male   DOB: 05-Feb-1960, 55 y.o.   MRN: [id number]   Location:   [facility] Room Number: 113-B Place of Service:  SNF (31)   CODE STATUS: DNR  No Known Allergies  Chief Complaint  Patient presents with  . Medical Management of Chronic Issues    Follow up    HPI:  He is a long term care resident of this facility being seen for the management of his chronic illnesses he is slowly declining. He is losing weight his current weight is 114 pounds. He has had 2 more falls since lowering his exelon patch to 4.6 mg. He is having repetitive verbiage all day; is unable to calm down at times. He is unable to participate in the hpi or ros.     Past Medical History  Diagnosis Date  . Cerebral palsy (HCC)     bed bound status  . Dementia   . HTN (hypertension)   . Depression   . Vitamin B 12 deficiency   . Hyperthyroidism     Past Surgical History  Procedure Laterality Date  . Dental procedure    . Foot surgeries      bilateral for cerebral palsy  . Total hip arthroplasty Right 02/09/2015    Procedure: TOTAL HIP ARTHROPLASTY ANTERIOR APPROACH;  Surgeon: Kennedy BuckerMichael Menz, MD;  Location: [facility] ORS;  Service: Orthopedics;  Laterality: Right;    Social History   Social History  . Marital Status: Single    Spouse Name: N/A  . Number of Children: N/A  . Years of Education: N/A   Occupational History  . Not on file.   Social History Main Topics  . Smoking status: Never Smoker   . Smokeless tobacco: Not on file  . Alcohol Use: No  . Drug Use: No  . Sexual Activity: No   Other Topics Concern  . Not on file   Social History Narrative   [facility]. Bedbound now and minimal verbal communication.   Family History  Problem Relation Age of Onset  . Dementia Mother   . CAD Brother   . Aortic aneurysm Brother   . Aortic aneurysm Father   . Kidney cancer Neg Hx   . Kidney disease Neg Hx   . Prostate cancer Neg Hx        VITAL SIGNS BP 116/62 mmHg  Pulse 62  Temp(Src) 98.2 F (36.8 C) (Oral)  Resp 18  Ht 5\' 7"  (1.702 m)  Wt 114 lb 3 oz (51.795 kg)  BMI 17.88 kg/m2  SpO2 96%  Patient's Medications  New Prescriptions   No medications on file  Previous Medications   ACETAMINOPHEN (TYLENOL) 325 MG TABLET    Take 650 mg by mouth 3 (three) times daily. 325 mg every 6 hours prn   ASPIRIN EC 81 MG TABLET    Take 81 mg by mouth every evening.   CLONAZEPAM (KLONOPIN) 0.25 MG DISINTEGRATING TABLET    Take 0.25 mg by mouth every morning.   DESMOPRESSIN (DDAVP) 0.1 MG TABLET    Take 0.1 mg by mouth at bedtime.    DOCUSATE SODIUM (COLACE) 100 MG CAPSULE    Take 1 capsule (100 mg total) by mouth 2 (two) times daily.   ESCITALOPRAM (LEXAPRO) 20 MG TABLET    Take 20 mg by mouth daily.   HYDROCORTISONE (ANUSOL-HC) 25 MG SUPPOSITORY    Place 25 mg rectally 2 (two) times daily.   MEMANTINE (NAMENDA  XR) 14 MG CP24 24 HR CAPSULE    Take 14 mg by mouth daily.   MIRTAZAPINE (REMERON) 15 MG TABLET    Take 15 mg by mouth every evening.    OXYCODONE (OXY IR/ROXICODONE) 5 MG IMMEDIATE RELEASE TABLET    Take 1-2 tablets (5-10 mg total) by mouth every 4 (four) hours as needed for breakthrough pain.   POLYETHYLENE GLYCOL (MIRALAX / GLYCOLAX) PACKET    Take 17 g by mouth daily.   RISPERIDONE (RISPERDAL) 1 MG TABLET    Take 1 mg by mouth 2 (two) times daily.    RIVASTIGMINE (EXELON) 4.6 MG/24HR    Place onto the skin daily.   Modified Medications   No medications on file  Discontinued Medications   No medications on file     SIGNIFICANT DIAGNOSTIC EXAMS  02-06-15: ct of abdomen and pelvis: 1. Massive fecal impaction with extensive stool throughout the olon. 2. 12 mm mass on the lower pole of the left kidney worrisome for a small malignancy. 3. Probable cholelithiasis. 4. 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  02-06-15: ct of head: No acute intracranial process. Mega cisterna magna. Mild  cortical volume loss.  02-06-15: right femur x-ray: Displaced/comminuted fracture of the right femoral neck, centered within the subcapital region but with extension into lateral portion of the right femoral head. At least some degree of associated impaction at the fracture site. Main component of the femoral head remains well positioned relative to the acetabulum.  02-06-15: right fibula/tibia x-ray: No acute abnormality. Atrophic musculature right lower leg. Talonavicular degenerative change.   02-06-15: chest x-ray: Mild left base subsegmental atelectasis .  02-07-15: renal ultrasound: 1. Subtle isoechoic-to-hyperechoic mass within the lower pole of the LEFT kidney, measuring 2.3 x 2 cm, not adequately visualized for definitive characterization, a possible correlate for the 1.2 cm mass seen on recent CT. Recommend more definitive characterization with either renal protocol CT (with and without contrast) or renal protocol MRI. 2. Right kidney appears normal. 3. Probable debris within the dependent aspects of the bladder. Bladder otherwise unremarkable  02-08-15; kub: Findings are compatible with the given history of constipation. Large stool ball distending the rectal vault, measuring at least 11 x 9 cm. Large amount of stool throughout the colon.  05-05-15: right hip fracture: no acute process    LABS REVIEWED:   02-06-15: wbc 6.3; hgb 15.2; hct 46.1; mcv 90.7; plt 281; glucose 96; bun 9; creat 0.55; k+ 3.9; na++ 139; liver normal albumin 3.5 02-07-15: wbc 7.6; hgb 14.8; hct 44.0; mcv 94.0; plt 259; glucose 100; bun 10; creat 0.83; k+ 3.9; na++141 02-12-15: wbc 5.3; hgb 11.8; hct 34.8; mcv 90.5; plt 151; glucose 110; bun 10; creat 0.51; k+ 3.4; na++137  04-03-15: glucose 92; bun 13; creat 0.63; k+ 4.3; na++139  06-24-15: wbc 5.7; hgb 14.5; hct 43.7; mcv 91.0; plt 244; glucose 119; bun 13; creat 0.65; k+ 4.1; na++ 137; liver normal albumin 3.7; hgb a1c 5.4     Review of Systems  Unable to perform ROS:  dementia      Physical Exam  Constitutional: No distress.  Frail   Eyes: Conjunctivae are normal.  Neck: Neck supple. No JVD present. No thyromegaly present.  Cardiovascular: Normal rate, regular rhythm and intact distal pulses.   Respiratory: Effort normal and breath sounds normal. No respiratory distress. He has no wheezes.  GI: Soft. Bowel sounds are normal. He exhibits no distension. There is no tenderness.  Musculoskeletal: He exhibits  no edema.  Able to move all extremities  Is status post right femur fracture Jan 2017  Lymphadenopathy:    He has no cervical adenopathy.  Neurological:  Is aware of surroundings Is less restless;    Skin: Skin is warm and dry. He is not diaphoretic.     ASSESSMENT/PLAN  1. Left femoral head fracture: (jan 2017) will continue oxycodone 5 or 10 mg every 4 hours as needed; will monitor his status.   2. Major depression with psychotic features: will continue lexapro 20 mg daily remeron 15 mg nightly will continue risperdal  1 mg twice daily due to akathisia; will increase klonopin to 0.5 mg in the AM and 0.25 mg in the PM to help with anxiety and akathisia. Will monitor his status.    3. Vascular dementia: has cerebral palsy: he is slowly declining; is losing weight  will continue namenda xr 28 mg daily; will continue exelon patch 4.6 mg daily  asa 81 mg daily  His current weight is 114 pounds   4. Constipation: will continue miralax daily colace twice daily   5. Left renal mass: is being followed by urology; will monitor   6. Weight loss/failure to thrive: his current weight is 114 pounds; he is slowly losing weight; will continue supplements per facility protocol   Will setup a palliative care consult     Will stop DDAVP     Synthia Innocent NP [facility]  Contact [phone] Monday through Friday 8am- 5pm  After hours call [phone]

## 2015-09-16 LAB — CBC AND DIFFERENTIAL
HCT: 46 % (ref 41–53)
Hemoglobin: 15.1 g/dL (ref 13.5–17.5)
Platelets: 243 10*3/uL (ref 150–399)
WBC: 6.3 10*3/mL

## 2015-09-16 LAB — BASIC METABOLIC PANEL
BUN: 19 mg/dL (ref 4–21)
CREATININE: 0.7 mg/dL (ref 0.6–1.3)
Glucose: 81 mg/dL
POTASSIUM: 4.4 mmol/L (ref 3.4–5.3)
Sodium: 143 mmol/L (ref 137–147)

## 2015-09-16 LAB — HEPATIC FUNCTION PANEL
ALK PHOS: 60 U/L (ref 25–125)
ALT: 5 U/L — AB (ref 10–40)
AST: 9 U/L — AB (ref 14–40)
Bilirubin, Total: 0.2 mg/dL

## 2015-09-17 ENCOUNTER — Non-Acute Institutional Stay (SKILLED_NURSING_FACILITY): Payer: Medicare Other | Admitting: Adult Health

## 2015-09-17 DIAGNOSIS — G2589 Other specified extrapyramidal and movement disorders: Secondary | ICD-10-CM

## 2015-09-17 DIAGNOSIS — S72002S Fracture of unspecified part of neck of left femur, sequela: Secondary | ICD-10-CM | POA: Diagnosis not present

## 2015-09-17 DIAGNOSIS — F0280 Dementia in other diseases classified elsewhere without behavioral disturbance: Secondary | ICD-10-CM | POA: Diagnosis not present

## 2015-09-17 DIAGNOSIS — G300 Alzheimer's disease with early onset: Secondary | ICD-10-CM | POA: Diagnosis not present

## 2015-09-17 DIAGNOSIS — G808 Other cerebral palsy: Secondary | ICD-10-CM

## 2015-09-17 DIAGNOSIS — T43505A Adverse effect of unspecified antipsychotics and neuroleptics, initial encounter: Secondary | ICD-10-CM

## 2015-09-17 DIAGNOSIS — F323 Major depressive disorder, single episode, severe with psychotic features: Secondary | ICD-10-CM | POA: Diagnosis not present

## 2015-09-17 DIAGNOSIS — T43595A Adverse effect of other antipsychotics and neuroleptics, initial encounter: Secondary | ICD-10-CM

## 2015-09-17 DIAGNOSIS — G2571 Drug induced akathisia: Secondary | ICD-10-CM

## 2015-10-17 ENCOUNTER — Encounter: Payer: Self-pay | Admitting: Adult Health

## 2015-10-17 NOTE — Progress Notes (Signed)
[facility]    Place of Service:  SNF (31)   CODE STATUS: dnr   No Known Allergies  Chief Complaint  Patient presents with  . Medical Management of Chronic Issues    HPI:  He is a long term resident of this facility of this facility being seen for the management of his chronic illnesses. He did not tolerate coming off nuedexta this medication was restarted. He is slowly declining with a slow weight loss present. He is unable to participate in the hpi or ros. There are no nursing concerns at this time.   Past Medical History:  Diagnosis Date  . Cerebral palsy (HCC)    bed bound status  . Dementia   . Depression   . HTN (hypertension)   . Hyperthyroidism   . Vitamin B 12 deficiency     Past Surgical History:  Procedure Laterality Date  . Dental procedure    . foot surgeries     bilateral for cerebral palsy  . TOTAL HIP ARTHROPLASTY Right 02/09/2015   Procedure: TOTAL HIP ARTHROPLASTY ANTERIOR APPROACH;  Surgeon: Kennedy BuckerMichael Menz, MD;  Location: [facility] ORS;  Service: Orthopedics;  Laterality: Right;    Social History   Social History  . Marital status: Single    Spouse name: N/A  . Number of children: N/A  . Years of education: N/A   Occupational History  . Not on file.   Social History Main Topics  . Smoking status: Never Smoker  . Smokeless tobacco: Not on file  . Alcohol use No  . Drug use: No  . Sexual activity: No   Other Topics Concern  . Not on file   Social History Narrative   [facility]. Bedbound now and minimal verbal communication.   Family History  Problem Relation Age of Onset  . Dementia Mother   . CAD Brother   . Aortic aneurysm Brother   . Aortic aneurysm Father   . Kidney cancer Neg Hx   . Kidney disease Neg Hx   . Prostate cancer Neg Hx       VITAL SIGNS BP 101/80   Pulse 78   Ht 5\' 7"  (1.702 m)   Wt 110 lb 3.2 oz (50 kg)   SpO2 95%   BMI 17.26 kg/m   Patient's Medications  New Prescriptions     No medications on file  Previous Medications   ACETAMINOPHEN (TYLENOL) 325 MG TABLET    Take 650 mg by mouth 3 (three) times daily. 325 mg every 6 hours prn   ASPIRIN EC 81 MG TABLET    Take 81 mg by mouth every evening.   CLONAZEPAM (KLONOPIN) 0.25 MG DISINTEGRATING TABLET    Take 0.5 mg twice daily    DOCUSATE SODIUM (COLACE) 100 MG CAPSULE    Take 1 capsule (100 mg total) by mouth 2 (two) times daily.   ESCITALOPRAM (LEXAPRO) 20 MG TABLET    Take 20 mg by mouth daily.   HYDROCORTISONE (ANUSOL-HC) 25 MG SUPPOSITORY    Place 25 mg rectally 2 (two) times daily.   MEMANTINE (NAMENDA XR) 14 MG CP24 24 HR CAPSULE    Take 14 mg by mouth daily.   MIRTAZAPINE (REMERON) 15 MG TABLET    Take 15 mg by mouth every evening.    nuedexta 20-10 mg  Take twice daily   OXYCODONE (OXY IR/ROXICODONE) 5 MG IMMEDIATE RELEASE TABLET    Take 1-2 tablets (5-10 mg total)  by mouth every 4 (four) hours as needed for breakthrough pain.   POLYETHYLENE GLYCOL (MIRALAX / GLYCOLAX) PACKET    Take 17 g by mouth daily.   RISPERIDONE (RISPERDAL) 1 MG TABLET    Take 1 mg by mouth 2 (two) times daily.    RIVASTIGMINE (EXELON) 4.6 MG/24HR    Place onto the skin daily.   Modified Medications   No medications on file  Discontinued Medications   No medications on file     SIGNIFICANT DIAGNOSTIC EXAMS   02-06-15: ct of abdomen and pelvis: 1. Massive fecal impaction with extensive stool throughout the olon. 2. 12 mm mass on the lower pole of the left kidney worrisome for a small malignancy. 3. Probable cholelithiasis. 4. 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  02-06-15: ct of head: No acute intracranial process. Mega cisterna magna. Mild cortical volume loss.  02-06-15: right femur x-ray: Displaced/comminuted fracture of the right femoral neck, centered within the subcapital region but with extension into lateral portion of the right femoral head. At least some degree of associated impaction at the  fracture site. Main component of the femoral head remains well positioned relative to the acetabulum.  02-06-15: right fibula/tibia x-ray: No acute abnormality. Atrophic musculature right lower leg. Talonavicular degenerative change.   02-06-15: chest x-ray: Mild left base subsegmental atelectasis .  02-07-15: renal ultrasound: 1. Subtle isoechoic-to-hyperechoic mass within the lower pole of the LEFT kidney, measuring 2.3 x 2 cm, not adequately visualized for definitive characterization, a possible correlate for the 1.2 cm mass seen on recent CT. Recommend more definitive characterization with either renal protocol CT (with and without contrast) or renal protocol MRI. 2. Right kidney appears normal. 3. Probable debris within the dependent aspects of the bladder. Bladder otherwise unremarkable  02-08-15; kub: Findings are compatible with the given history of constipation. Large stool ball distending the rectal vault, measuring at least 11 x 9 cm. Large amount of stool throughout the colon.  05-05-15: right hip fracture: no acute process    LABS REVIEWED:   02-06-15: wbc 6.3; hgb 15.2; hct 46.1; mcv 90.7; plt 281; glucose 96; bun 9; creat 0.55; k+ 3.9; na++ 139; liver normal albumin 3.5 02-07-15: wbc 7.6; hgb 14.8; hct 44.0; mcv 94.0; plt 259; glucose 100; bun 10; creat 0.83; k+ 3.9; na++141 02-12-15: wbc 5.3; hgb 11.8; hct 34.8; mcv 90.5; plt 151; glucose 110; bun 10; creat 0.51; k+ 3.4; na++137  04-03-15: glucose 92; bun 13; creat 0.63; k+ 4.3; na++139  06-24-15: wbc 5.7; hgb 14.5; hct 43.7; mcv 91.0; plt 244; glucose 119; bun 13; creat 0.65; k+ 4.1; na++ 137; liver normal albumin 3.7; hgb a1c 5.4     Review of Systems  Unable to perform ROS: dementia      Physical Exam  Constitutional: No distress.  Frail   Eyes: Conjunctivae are normal.  Neck: Neck supple. No JVD present. No thyromegaly present.  Cardiovascular: Normal rate, regular rhythm and intact distal pulses.   Respiratory: Effort  normal and breath sounds normal. No respiratory distress. He has no wheezes.  GI: Soft. Bowel sounds are normal. He exhibits no distension. There is no tenderness.  Musculoskeletal: He exhibits no edema.  Able to move all extremities  Is status post right femur fracture Jan 2017  Lymphadenopathy:    He has no cervical adenopathy.  Neurological:  Is aware of surroundings Is less restless;    Skin: Skin is warm and dry. He is not diaphoretic.  ASSESSMENT/PLAN  1. Left femoral head fracture: (jan 2017) will continue oxycodone 5 or 10 mg every 4 hours as needed; will monitor his status.   2. Major depression with psychotic features: will continue lexapro 20 mg daily remeron 15 mg nightly will continue risperdal  1 mg twice daily due to akathisia; will continue klonopin 0.5 mg twice daily to help with anxiety and akathisia. Will monitor his status.    3. Vascular dementia: has cerebral palsy: he is slowly declining; is losing weight  will continue namenda xr 14 mg daily; will continue exelon patch 4.6 mg daily  asa 81 mg daily  His current weight is 110.2 pounds   4. Constipation: will continue miralax daily colace twice daily   5. Left renal mass: is being followed by urology; will monitor   6. Weight loss/failure to thrive: his current weight is 110.2 pounds; he is slowly losing weight; will continue supplements per facility protocol   7. 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 We will attempt to wean resident as apropriate   Synthia Innocent NP [facility]  Contact [phone] Monday through Friday 8am- 5pm  After hours call [phone]

## 2015-10-26 ENCOUNTER — Non-Acute Institutional Stay (SKILLED_NURSING_FACILITY): Payer: Medicare Other | Admitting: Adult Health

## 2015-10-26 ENCOUNTER — Encounter: Payer: Self-pay | Admitting: Adult Health

## 2015-10-26 DIAGNOSIS — R634 Abnormal weight loss: Secondary | ICD-10-CM | POA: Insufficient documentation

## 2015-10-26 DIAGNOSIS — G808 Other cerebral palsy: Secondary | ICD-10-CM | POA: Diagnosis not present

## 2015-10-26 DIAGNOSIS — F0280 Dementia in other diseases classified elsewhere without behavioral disturbance: Secondary | ICD-10-CM

## 2015-10-26 DIAGNOSIS — N2889 Other specified disorders of kidney and ureter: Secondary | ICD-10-CM | POA: Diagnosis not present

## 2015-10-26 DIAGNOSIS — R627 Adult failure to thrive: Secondary | ICD-10-CM

## 2015-10-26 DIAGNOSIS — T43595A Adverse effect of other antipsychotics and neuroleptics, initial encounter: Secondary | ICD-10-CM | POA: Diagnosis not present

## 2015-10-26 DIAGNOSIS — G300 Alzheimer's disease with early onset: Secondary | ICD-10-CM | POA: Diagnosis not present

## 2015-10-26 DIAGNOSIS — G2589 Other specified extrapyramidal and movement disorders: Secondary | ICD-10-CM | POA: Diagnosis not present

## 2015-10-26 DIAGNOSIS — F482 Pseudobulbar affect: Secondary | ICD-10-CM | POA: Diagnosis not present

## 2015-10-26 DIAGNOSIS — G2571 Drug induced akathisia: Secondary | ICD-10-CM

## 2015-10-26 DIAGNOSIS — T43505A Adverse effect of unspecified antipsychotics and neuroleptics, initial encounter: Secondary | ICD-10-CM

## 2015-10-26 NOTE — Progress Notes (Signed)
Patient ID: Maurice FOURNET, male   DOB: 02-Jun-1960, 55 y.o.   MRN: [id number]    Location:   [facility] Room Number: 113-B Place of Service:  SNF (31)   CODE STATUS: DNR  No Known Allergies  Chief Complaint  Patient presents with  . Medical Management of Chronic Issues    Follow up    HPI:  He is a long term resident of this facility being seen for the management of his chronic illnesses.  He continues to have frequent periods of yelling out; however; the nuedexta does help with the symptoms.  He is unable to participate in the hpi or ros. There are no nursing concerns at this time.   Past Medical History:  Diagnosis Date  . Cerebral palsy (HCC)    bed bound status  . Dementia   . Depression   . HTN (hypertension)   . Hyperthyroidism   . Slow transit constipation 03/27/2015  . Vitamin B 12 deficiency     Past Surgical History:  Procedure Laterality Date  . Dental procedure    . foot surgeries     bilateral for cerebral palsy  . TOTAL HIP ARTHROPLASTY Right 02/09/2015   Procedure: TOTAL HIP ARTHROPLASTY ANTERIOR APPROACH;  Surgeon: Kennedy Bucker, MD;  Location: [facility] ORS;  Service: Orthopedics;  Laterality: Right;    Social History   Social History  . Marital status: Single    Spouse name: N/A  . Number of children: N/A  . Years of education: N/A   Occupational History  . Not on file.   Social History Main Topics  . Smoking status: Never Smoker  . Smokeless tobacco: Not on file  . Alcohol use No  . Drug use: No  . Sexual activity: No   Other Topics Concern  . Not on file   Social History Narrative   From [facility]. Bedbound now and minimal verbal communication.   Family History  Problem Relation Age of Onset  . Dementia Mother   . CAD Brother   . Aortic aneurysm Brother   . Aortic aneurysm Father   . Kidney cancer Neg Hx   . Kidney disease Neg Hx   . Prostate cancer Neg Hx       VITAL SIGNS BP (!) 152/78   Pulse 94    Temp 97.9 F (36.6 C) (Oral)   Resp 18   Ht 5\' 7"  (1.702 m)   Wt 109 lb 2 oz (49.5 kg)   SpO2 95%   BMI 17.09 kg/m   Patient's Medications  New Prescriptions   No medications on file  Previous Medications   ACETAMINOPHEN (TYLENOL) 325 MG TABLET    Take 650 mg by mouth 3 (three) times daily. 325 mg every 6 hours prn   ASPIRIN EC 81 MG TABLET    Take 81 mg by mouth every evening.   CLONAZEPAM (KLONOPIN) 0.5 MG TABLET    Take 0.5 mg by mouth at bedtime.   CLONAZEPAM (KLONOPIN) 1 MG TABLET    Take 1 mg by mouth every morning.   DEXTROMETHORPHAN-QUINIDINE (NUEDEXTA) 20-10 MG CAPS    Take 1 tablet by mouth 2 (two) times daily.   DOCUSATE SODIUM (COLACE) 100 MG CAPSULE    Take 1 capsule (100 mg total) by mouth 2 (two) times daily.   ESCITALOPRAM (LEXAPRO) 20 MG TABLET    Take 20 mg by mouth daily.   HYDROCORTISONE (ANUSOL-HC) 25 MG SUPPOSITORY    Place 25  mg rectally 2 (two) times daily.   MEMANTINE (NAMENDA XR) 14 MG CP24 24 HR CAPSULE    Take 14 mg by mouth daily.   MIRTAZAPINE (REMERON) 15 MG TABLET    Take 15 mg by mouth every evening.    OXYCODONE (OXY IR/ROXICODONE) 5 MG IMMEDIATE RELEASE TABLET    Take 1-2 tablets (5-10 mg total) by mouth every 4 (four) hours as needed for breakthrough pain.   POLYETHYLENE GLYCOL (MIRALAX / GLYCOLAX) PACKET    Take 17 g by mouth daily.   RISPERIDONE (RISPERDAL) 1 MG TABLET    Take 1 mg by mouth 2 (two) times daily.    RIVASTIGMINE (EXELON) 4.6 MG/24HR    Place onto the skin daily.   Modified Medications   No medications on file  Discontinued Medications   CLONAZEPAM (KLONOPIN) 0.25 MG DISINTEGRATING TABLET    Take 0.25 mg by mouth every morning.   DESMOPRESSIN (DDAVP) 0.1 MG TABLET    Take 0.1 mg by mouth at bedtime.      SIGNIFICANT DIAGNOSTIC EXAMS  02-06-15: ct of abdomen and pelvis: 1. Massive fecal impaction with extensive stool throughout the olon. 2. 12 mm mass on the lower pole of the left kidney worrisome for a small malignancy. 3.  Probable cholelithiasis. 4. 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  02-06-15: ct of head: No acute intracranial process. Mega cisterna magna. Mild cortical volume loss.  02-06-15: right femur x-ray: Displaced/comminuted fracture of the right femoral neck, centered within the subcapital region but with extension into lateral portion of the right femoral head. At least some degree of associated impaction at the fracture site. Main component of the femoral head remains well positioned relative to the acetabulum.  02-06-15: right fibula/tibia x-ray: No acute abnormality. Atrophic musculature right lower leg. Talonavicular degenerative change.   02-06-15: chest x-ray: Mild left base subsegmental atelectasis .  02-07-15: renal ultrasound: 1. Subtle isoechoic-to-hyperechoic mass within the lower pole of the LEFT kidney, measuring 2.3 x 2 cm, not adequately visualized for definitive characterization, a possible correlate for the 1.2 cm mass seen on recent CT. Recommend more definitive characterization with either renal protocol CT (with and without contrast) or renal protocol MRI. 2. Right kidney appears normal. 3. Probable debris within the dependent aspects of the bladder. Bladder otherwise unremarkable  02-08-15; kub: Findings are compatible with the given history of constipation. Large stool ball distending the rectal vault, measuring at least 11 x 9 cm. Large amount of stool throughout the colon.  05-05-15: right hip fracture: no acute process   09-15-15: EKG: sinus tachycardia    LABS REVIEWED:   02-06-15: wbc 6.3; hgb 15.2; hct 46.1; mcv 90.7; plt 281; glucose 96; bun 9; creat 0.55; k+ 3.9; na++ 139; liver normal albumin 3.5 02-07-15: wbc 7.6; hgb 14.8; hct 44.0; mcv 94.0; plt 259; glucose 100; bun 10; creat 0.83; k+ 3.9; na++141 02-12-15: wbc 5.3; hgb 11.8; hct 34.8; mcv 90.5; plt 151; glucose 110; bun 10; creat 0.51; k+ 3.4; na++137  04-03-15: glucose 92; bun 13; creat 0.63; k+  4.3; na++139  06-24-15: wbc 5.7; hgb 14.5; hct 43.7; mcv 91.0; plt 244; glucose 119; bun 13; creat 0.65; k+ 4.1; na++ 137; liver normal albumin 3.7; hgb a1c 5.4  09-16-15: wbc 6.3; hgb 15.1; hct 45.5; mcv 92.9; plt 243; glucose 81; bun 29; creat 0.73; k+ 4.4 ;na++ 143 ast 9; alt 5; albumin 3.7    Review of Systems  Unable to perform ROS: dementia  Physical Exam  Constitutional: No distress.  Frail   Eyes: Conjunctivae are normal.  Neck: Neck supple. No JVD present. No thyromegaly present.  Cardiovascular: Normal rate, regular rhythm and intact distal pulses.   Respiratory: Effort normal and breath sounds normal. No respiratory distress. He has no wheezes.  GI: Soft. Bowel sounds are normal. He exhibits no distension. There is no tenderness.  Musculoskeletal: He exhibits no edema.  Able to move all extremities  Is status post right femur fracture Jan 2017  Lymphadenopathy:    He has no cervical adenopathy.  Neurological:  Is aware of surroundings Is less restless;    Skin: Skin is warm and dry. He is not diaphoretic.     ASSESSMENT/PLAN  1. Left femoral head fracture: (jan 2017) will continue oxycodone 5 or 10 mg every 4 hours as needed; will monitor his status.   2. Major depression with psychotic features: will continue lexapro 20 mg daily remeron 15 mg nightly will continue risperdal  1 mg twice daily; will continue klonopin 1 mg in the AM and 0.5 mg nightly  to help with anxiety and akathisia. Will monitor his status.  Is followed by psych services   3. Vascular dementia: has cerebral palsy: he is slowly declining; is losing weight  will continue namenda xr 14 mg daily; will continue exelon patch 4.6 mg daily  asa 81 mg daily  His current weight is 109 pounds   4. Constipation: will continue miralax daily colace twice daily   5. Left renal mass: is being followed by urology; will monitor   6. Weight loss/failure to thrive: his current weight is 109 pounds; he is slowly  losing weight; will continue supplements per facility protocol   7. 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 We will attempt to wean resident as apropriate        Synthia Innocenteborah Green NP [facility]  Contact [phone] Monday through Friday 8am- 5pm  After hours call [phone]

## 2015-11-12 ENCOUNTER — Non-Acute Institutional Stay (SKILLED_NURSING_FACILITY): Payer: Medicare Other | Admitting: Adult Health

## 2015-11-12 ENCOUNTER — Encounter: Payer: Self-pay | Admitting: Adult Health

## 2015-11-12 DIAGNOSIS — S72002S Fracture of unspecified part of neck of left femur, sequela: Secondary | ICD-10-CM

## 2015-11-12 DIAGNOSIS — G894 Chronic pain syndrome: Secondary | ICD-10-CM | POA: Diagnosis not present

## 2015-11-12 NOTE — Progress Notes (Signed)
Patient ID: Maurice Clayton, male   DOB: Apr 29, 1960, 55 y.o.   MRN: [id number]   Location:   [facility] Room Number: 113-B Place of Service:  SNF (31)   CODE STATUS: DNR  No Known Allergies  Chief Complaint  Patient presents with  . Acute Visit    Pain Management    HPI:  The nursing staff has been concerned about his level of pain. They have been giving him his prn medication on a more frequent basis. They feel as though the mediation is helping as he is not yelling out as often. They feel as though he would benefit from routine dosing of pain medication. He is unable to participate in the hpi or ros.    Past Medical History:  Diagnosis Date  . Cerebral palsy (HCC)    bed bound status  . Dementia   . Depression   . HTN (hypertension)   . Hyperthyroidism   . Slow transit constipation 03/27/2015  . Vitamin B 12 deficiency     Past Surgical History:  Procedure Laterality Date  . Dental procedure    . foot surgeries     bilateral for cerebral palsy  . TOTAL HIP ARTHROPLASTY Right 02/09/2015   Procedure: TOTAL HIP ARTHROPLASTY ANTERIOR APPROACH;  Surgeon: Kennedy Bucker, MD;  Location: [facility]S;  Service: Orthopedics;  Laterality: Right;    Social History   Social History  . Marital status: Single    Spouse name: N/A  . Number of children: N/A  . Years of education: N/A   Occupational History  . Not on file.   Social History Main Topics  . Smoking status: Never Smoker  . Smokeless tobacco: Not on file  . Alcohol use No  . Drug use: No  . Sexual activity: No   Other Topics Concern  . Not on file   Social History Narrative   From [facility]. Bedbound now and minimal verbal communication.   Family History  Problem Relation Age of Onset  . Dementia Mother   . CAD Brother   . Aortic aneurysm Brother   . Aortic aneurysm Father   . Kidney cancer Neg Hx   . Kidney disease Neg Hx   . Prostate cancer Neg Hx       VITAL SIGNS BP  125/65   Pulse 77   Temp 97.8 F (36.6 C) (Oral)   Resp 17   Ht 5\' 7"  (1.702 m)   Wt 110 lb 3 oz (50 kg)   SpO2 97%   BMI 17.26 kg/m   Patient's Medications  New Prescriptions   No medications on file  Previous Medications   ACETAMINOPHEN (TYLENOL) 325 MG TABLET    Take 650 mg by mouth 3 (three) times daily. 325 mg every 6 hours prn   ASPIRIN EC 81 MG TABLET    Take 81 mg by mouth every evening.   CLONAZEPAM (KLONOPIN) 0.5 MG TABLET    Take 0.5 mg by mouth at bedtime.   CLONAZEPAM (KLONOPIN) 1 MG TABLET    Take 1 mg by mouth every morning.   DEXTROMETHORPHAN-QUINIDINE (NUEDEXTA) 20-10 MG CAPS    Take 1 tablet by mouth 2 (two) times daily.   DOCUSATE SODIUM (COLACE) 100 MG CAPSULE    Take 1 capsule (100 mg total) by mouth 2 (two) times daily.   ESCITALOPRAM (LEXAPRO) 20 MG TABLET    Take 20 mg by mouth daily.   HYDROCORTISONE (ANUSOL-HC) 25 MG SUPPOSITORY  Place 25 mg rectally 2 (two) times daily.   MEMANTINE (NAMENDA XR) 14 MG CP24 24 HR CAPSULE    Take 14 mg by mouth daily.   MIRTAZAPINE (REMERON) 15 MG TABLET    Take 15 mg by mouth every evening.    OXYCODONE (OXY IR/ROXICODONE) 5 MG IMMEDIATE RELEASE TABLET    Take 1-2 tablets (5-10 mg total) by mouth every 4 (four) hours as needed for breakthrough pain.   POLYETHYLENE GLYCOL (MIRALAX / GLYCOLAX) PACKET    Take 17 g by mouth daily.   RISPERIDONE (RISPERDAL) 1 MG TABLET    Take 1 mg by mouth 2 (two) times daily.    RIVASTIGMINE (EXELON) 4.6 MG/24HR    Place onto the skin daily.   Modified Medications   No medications on file  Discontinued Medications   No medications on file     SIGNIFICANT DIAGNOSTIC EXAMS  02-06-15: ct of abdomen and pelvis: 1. Massive fecal impaction with extensive stool throughout the olon. 2. 12 mm mass on the lower pole of the left kidney worrisome for a small malignancy. 3. Probable cholelithiasis. 4. 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  02-06-15: ct of head: No acute  intracranial process. Mega cisterna magna. Mild cortical volume loss.  02-06-15: right femur x-ray: Displaced/comminuted fracture of the right femoral neck, centered within the subcapital region but with extension into lateral portion of the right femoral head. At least some degree of associated impaction at the fracture site. Main component of the femoral head remains well positioned relative to the acetabulum.  02-06-15: right fibula/tibia x-ray: No acute abnormality. Atrophic musculature right lower leg. Talonavicular degenerative change.   02-06-15: chest x-ray: Mild left base subsegmental atelectasis .  02-07-15: renal ultrasound: 1. Subtle isoechoic-to-hyperechoic mass within the lower pole of the LEFT kidney, measuring 2.3 x 2 cm, not adequately visualized for definitive characterization, a possible correlate for the 1.2 cm mass seen on recent CT. Recommend more definitive characterization with either renal protocol CT (with and without contrast) or renal protocol MRI. 2. Right kidney appears normal. 3. Probable debris within the dependent aspects of the bladder. Bladder otherwise unremarkable  02-08-15; kub: Findings are compatible with the given history of constipation. Large stool ball distending the rectal vault, measuring at least 11 x 9 cm. Large amount of stool throughout the colon.  05-05-15: right hip fracture: no acute process   09-15-15: EKG: sinus tachycardia    LABS REVIEWED:   02-06-15: wbc 6.3; hgb 15.2; hct 46.1; mcv 90.7; plt 281; glucose 96; bun 9; creat 0.55; k+ 3.9; na++ 139; liver normal albumin 3.5 02-07-15: wbc 7.6; hgb 14.8; hct 44.0; mcv 94.0; plt 259; glucose 100; bun 10; creat 0.83; k+ 3.9; na++141 02-12-15: wbc 5.3; hgb 11.8; hct 34.8; mcv 90.5; plt 151; glucose 110; bun 10; creat 0.51; k+ 3.4; na++137  04-03-15: glucose 92; bun 13; creat 0.63; k+ 4.3; na++139  06-24-15: wbc 5.7; hgb 14.5; hct 43.7; mcv 91.0; plt 244; glucose 119; bun 13; creat 0.65; k+ 4.1; na++ 137;  liver normal albumin 3.7; hgb a1c 5.4  09-16-15: wbc 6.3; hgb 15.1; hct 45.5; mcv 92.9; plt 243; glucose 81; bun 29; creat 0.73; k+ 4.4 ;na++ 143 ast 9; alt 5; albumin 3.7    Review of Systems  Unable to perform ROS: dementia      Physical Exam  Constitutional: No distress.  Frail   Eyes: Conjunctivae are normal.  Neck: Neck supple. No JVD present. No thyromegaly present.  Cardiovascular: Normal rate, regular rhythm and intact distal pulses.   Respiratory: Effort normal and breath sounds normal. No respiratory distress. He has no wheezes.  GI: Soft. Bowel sounds are normal. He exhibits no distension. There is no tenderness.  Musculoskeletal: He exhibits no edema.  Able to move all extremities  Is status post right femur fracture Jan 2017  Lymphadenopathy:    He has no cervical adenopathy.  Neurological:  Is aware of surroundings Is less restless;    Skin: Skin is warm and dry. He is not diaphoretic.     ASSESSMENT/PLAN  1. Left femoral head fracture: (jan 2017) has chronic pain: will stop the prn tylenol will begin  oxycodone 5 mg four times daily and will monitor his status. .    MD is aware of resident's narcotic use and is in agreement with current plan of care. We will attempt to wean resident as apropriate   Synthia Innocent NP [facility]  Contact [phone] Monday through Friday 8am- 5pm  After hours call [phone]

## 2015-11-26 ENCOUNTER — Encounter: Payer: Self-pay | Admitting: Adult Health

## 2015-11-26 ENCOUNTER — Non-Acute Institutional Stay (SKILLED_NURSING_FACILITY): Payer: Medicare Other | Admitting: Adult Health

## 2015-11-26 DIAGNOSIS — T43505A Adverse effect of unspecified antipsychotics and neuroleptics, initial encounter: Secondary | ICD-10-CM

## 2015-11-26 DIAGNOSIS — G2571 Drug induced akathisia: Secondary | ICD-10-CM

## 2015-11-26 DIAGNOSIS — F323 Major depressive disorder, single episode, severe with psychotic features: Secondary | ICD-10-CM

## 2015-11-26 DIAGNOSIS — G808 Other cerebral palsy: Secondary | ICD-10-CM | POA: Diagnosis not present

## 2015-11-26 DIAGNOSIS — F482 Pseudobulbar affect: Secondary | ICD-10-CM

## 2015-11-26 DIAGNOSIS — G300 Alzheimer's disease with early onset: Secondary | ICD-10-CM

## 2015-11-26 DIAGNOSIS — S72002S Fracture of unspecified part of neck of left femur, sequela: Secondary | ICD-10-CM | POA: Diagnosis not present

## 2015-11-26 DIAGNOSIS — N2889 Other specified disorders of kidney and ureter: Secondary | ICD-10-CM

## 2015-11-26 DIAGNOSIS — F0280 Dementia in other diseases classified elsewhere without behavioral disturbance: Secondary | ICD-10-CM

## 2015-11-26 NOTE — Progress Notes (Signed)
Patient ID: Maurice Clayton, male   DOB: [birth year], 55 y.o.   MRN: [id number]   Location:   [facility] Room Number: 113-B Place of Service:  SNF (31)   CODE STATUS: DNR  No Known Allergies  Chief Complaint  Patient presents with  . Medical Management of Chronic Issues    Follow up    HPI:  He is a long term resident of this facility being seen for the management of his chronic illnesses. Overall there is little change in his status. He does continue to get out of bed on a daily basis. He does continue to have periods of time when he yells out. There are no nursing concerns at this time.   Past Medical History:  Diagnosis Date  . Cerebral palsy (HCC)    bed bound status  . Dementia   . Depression   . HTN (hypertension)   . Hyperthyroidism   . Slow transit constipation 03/27/2015  . Vitamin B 12 deficiency     Past Surgical History:  Procedure Laterality Date  . Dental procedure    . foot surgeries     bilateral for cerebral palsy  . TOTAL HIP ARTHROPLASTY Right 02/09/2015   Procedure: TOTAL HIP ARTHROPLASTY ANTERIOR APPROACH;  Surgeon: Kennedy Bucker, MD;  Location: [facility] ORS;  Service: Orthopedics;  Laterality: Right;    Social History   Social History  . Marital status: Single    Spouse name: N/A  . Number of children: N/A  . Years of education: N/A   Occupational History  . Not on file.   Social History Main Topics  . Smoking status: Never Smoker  . Smokeless tobacco: Not on file  . Alcohol use No  . Drug use: No  . Sexual activity: No   Other Topics Concern  . Not on file   Social History Narrative   From [facility]. Bedbound now and minimal verbal communication.   Family History  Problem Relation Age of Onset  . Dementia Mother   . CAD Brother   . Aortic aneurysm Brother   . Aortic aneurysm Father   . Kidney cancer Neg Hx   . Kidney disease Neg Hx   . Prostate cancer Neg Hx       VITAL SIGNS BP 131/86   Pulse 99    Temp 98.9 F (37.2 C) (Oral)   Resp 18   Ht 5\' 7"  (1.702 m)   Wt 115 lb 4 oz (52.3 kg)   SpO2 98%   BMI 18.05 kg/m   Patient's Medications  New Prescriptions   No medications on file  Previous Medications   ACETAMINOPHEN (TYLENOL) 325 MG TABLET    Take 650 mg by mouth 3 (three) times daily. 325 mg every 6 hours prn   ASPIRIN EC 81 MG TABLET    Take 81 mg by mouth every evening.   CLONAZEPAM (KLONOPIN) 0.5 MG TABLET    Take 0.5 mg by mouth at bedtime.   CLONAZEPAM (KLONOPIN) 1 MG TABLET    Take 1 mg by mouth every morning.   DEXTROMETHORPHAN-QUINIDINE (NUEDEXTA) 20-10 MG CAPS    Take 1 tablet by mouth 2 (two) times daily.   DOCUSATE SODIUM (COLACE) 100 MG CAPSULE    Take 1 capsule (100 mg total) by mouth 2 (two) times daily.   ESCITALOPRAM (LEXAPRO) 20 MG TABLET    Take 20 mg by mouth daily.   HYDROCORTISONE (ANUSOL-HC) 25 MG SUPPOSITORY    Place  25 mg rectally 2 (two) times daily.   MEMANTINE (NAMENDA XR) 14 MG CP24 24 HR CAPSULE    Take 14 mg by mouth daily.   MIRTAZAPINE (REMERON) 15 MG TABLET    Take 15 mg by mouth every evening.    POLYETHYLENE GLYCOL (MIRALAX / GLYCOLAX) PACKET    Take 17 g by mouth daily.   RISPERIDONE (RISPERDAL) 1 MG TABLET    Take 1 mg by mouth 2 (two) times daily.    RIVASTIGMINE (EXELON) 4.6 MG/24HR    Place onto the skin daily.   Modified Medications   No medications on file  Discontinued Medications   OXYCODONE (OXY IR/ROXICODONE) 5 MG IMMEDIATE RELEASE TABLET    Take 1-2 tablets (5-10 mg total) by mouth every 4 (four) hours as needed for breakthrough pain.     SIGNIFICANT DIAGNOSTIC EXAMS  02-06-15: ct of abdomen and pelvis: 1. Massive fecal impaction with extensive stool throughout the olon. 2. 12 mm mass on the lower pole of the left kidney worrisome for a small malignancy. 3. Probable cholelithiasis. 4. 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  02-06-15: ct of head: No acute intracranial process. Mega cisterna magna. Mild  cortical volume loss.  02-06-15: right femur x-ray: Displaced/comminuted fracture of the right femoral neck, centered within the subcapital region but with extension into lateral portion of the right femoral head. At least some degree of associated impaction at the fracture site. Main component of the femoral head remains well positioned relative to the acetabulum.  02-06-15: right fibula/tibia x-ray: No acute abnormality. Atrophic musculature right lower leg. Talonavicular degenerative change.   02-06-15: chest x-ray: Mild left base subsegmental atelectasis .  02-07-15: renal ultrasound: 1. Subtle isoechoic-to-hyperechoic mass within the lower pole of the LEFT kidney, measuring 2.3 x 2 cm, not adequately visualized for definitive characterization, a possible correlate for the 1.2 cm mass seen on recent CT. Recommend more definitive characterization with either renal protocol CT (with and without contrast) or renal protocol MRI. 2. Right kidney appears normal. 3. Probable debris within the dependent aspects of the bladder. Bladder otherwise unremarkable  02-08-15; kub: Findings are compatible with the given history of constipation. Large stool ball distending the rectal vault, measuring at least 11 x 9 cm. Large amount of stool throughout the colon.  05-05-15: right hip fracture: no acute process   09-15-15: EKG: sinus tachycardia    LABS REVIEWED:   02-06-15: wbc 6.3; hgb 15.2; hct 46.1; mcv 90.7; plt 281; glucose 96; bun 9; creat 0.55; k+ 3.9; na++ 139; liver normal albumin 3.5 02-07-15: wbc 7.6; hgb 14.8; hct 44.0; mcv 94.0; plt 259; glucose 100; bun 10; creat 0.83; k+ 3.9; na++141 02-12-15: wbc 5.3; hgb 11.8; hct 34.8; mcv 90.5; plt 151; glucose 110; bun 10; creat 0.51; k+ 3.4; na++137  04-03-15: glucose 92; bun 13; creat 0.63; k+ 4.3; na++139  06-24-15: wbc 5.7; hgb 14.5; hct 43.7; mcv 91.0; plt 244; glucose 119; bun 13; creat 0.65; k+ 4.1; na++ 137; liver normal albumin 3.7; hgb a1c 5.4  09-16-15: wbc  6.3; hgb 15.1; hct 45.5; mcv 92.9; plt 243; glucose 81; bun 29; creat 0.73; k+ 4.4 ;na++ 143 ast 9; alt 5; albumin 3.7    Review of Systems  Unable to perform ROS: dementia      Physical Exam  Constitutional: No distress.  Frail   Eyes: Conjunctivae are normal.  Neck: Neck supple. No JVD present. No thyromegaly present.  Cardiovascular: Normal rate, regular rhythm and  intact distal pulses.   Respiratory: Effort normal and breath sounds normal. No respiratory distress. He has no wheezes.  GI: Soft. Bowel sounds are normal. He exhibits no distension. There is no tenderness.  Musculoskeletal: He exhibits no edema.  Able to move all extremities  Is status post right femur fracture Jan 2017  Lymphadenopathy:    He has no cervical adenopathy.  Neurological:  Is aware of surroundings Is less restless;    Skin: Skin is warm and dry. He is not diaphoretic.     ASSESSMENT/PLAN  1. Left femoral head fracture: (jan 2017) will continue oxycodone 5 mg four time daily he has done better since making his pain medication routine.  will monitor his status.   2. Major depression with psychotic features: will continue lexapro 20 mg daily remeron 15 mg nightly will continue risperdal  1 mg twice daily; will continue klonopin 1 mg in the AM and 0.5 mg nightly  to help with anxiety and akathisia. Will monitor his status.  Is followed by psych services   3. Vascular dementia: has cerebral palsy: he is slowly declining; is losing weight  will continue namenda xr 14 mg daily; will continue exelon patch 4.6 mg daily  asa 81 mg daily  His current weight is 109 pounds   4. Constipation: will continue miralax daily colace twice daily   5. Left renal mass: is being followed by urology; will monitor   6. Weight loss/failure to thrive: his current weight is 115 pounds; will continue supplements per facility protocol   7. 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 We will attempt to wean resident as apropriate   Synthia Innocenteborah Green NP [facility]  Contact [phone] Monday through Friday 8am- 5pm  After hours call [phone]

## 2015-11-29 DIAGNOSIS — G894 Chronic pain syndrome: Secondary | ICD-10-CM | POA: Insufficient documentation

## 2015-12-28 ENCOUNTER — Non-Acute Institutional Stay (SKILLED_NURSING_FACILITY): Payer: Medicare Other | Admitting: Adult Health

## 2015-12-28 ENCOUNTER — Encounter: Payer: Self-pay | Admitting: Adult Health

## 2015-12-28 DIAGNOSIS — S72002S Fracture of unspecified part of neck of left femur, sequela: Secondary | ICD-10-CM | POA: Diagnosis not present

## 2015-12-28 DIAGNOSIS — G808 Other cerebral palsy: Secondary | ICD-10-CM

## 2015-12-28 DIAGNOSIS — R627 Adult failure to thrive: Secondary | ICD-10-CM | POA: Diagnosis not present

## 2015-12-28 DIAGNOSIS — T43505A Adverse effect of unspecified antipsychotics and neuroleptics, initial encounter: Secondary | ICD-10-CM

## 2015-12-28 DIAGNOSIS — F482 Pseudobulbar affect: Secondary | ICD-10-CM

## 2015-12-28 DIAGNOSIS — G300 Alzheimer's disease with early onset: Secondary | ICD-10-CM | POA: Diagnosis not present

## 2015-12-28 DIAGNOSIS — F0280 Dementia in other diseases classified elsewhere without behavioral disturbance: Secondary | ICD-10-CM

## 2015-12-28 DIAGNOSIS — G2571 Drug induced akathisia: Secondary | ICD-10-CM | POA: Diagnosis not present

## 2015-12-28 DIAGNOSIS — K5901 Slow transit constipation: Secondary | ICD-10-CM

## 2015-12-28 DIAGNOSIS — F323 Major depressive disorder, single episode, severe with psychotic features: Secondary | ICD-10-CM | POA: Diagnosis not present

## 2015-12-28 NOTE — Progress Notes (Signed)
Patient ID: Maurice Clayton, male   DOB: [birth year], 55 y.o.   MRN: [id number]   Location:   [facility] Room Number: 113-B Place of Service:  SNF (31)   CODE STATUS: DNR  No Known Allergies  Chief Complaint  Patient presents with  . Medical Management of Chronic Issues    Follow up    HPI:  He is a long term resident of this facility being seen for the management of his chronic illnesses. He does continue to get out of bed daily; is having fewer periods of time of yelling out. He is unable to participate in the hpi or ros. There are no nursing concerns at this time.    Past Medical History:  Diagnosis Date  . Cerebral palsy (HCC)    bed bound status  . Dementia   . Depression   . HTN (hypertension)   . Hyperthyroidism   . Slow transit constipation 03/27/2015  . Vitamin B 12 deficiency     Past Surgical History:  Procedure Laterality Date  . Dental procedure    . foot surgeries     bilateral for cerebral palsy  . TOTAL HIP ARTHROPLASTY Right 02/09/2015   Procedure: TOTAL HIP ARTHROPLASTY ANTERIOR APPROACH;  Surgeon: Kennedy Bucker, MD;  Location: [facility] ORS;  Service: Orthopedics;  Laterality: Right;    Social History   Social History  . Marital status: Single    Spouse name: N/A  . Number of children: N/A  . Years of education: N/A   Occupational History  . Not on file.   Social History Main Topics  . Smoking status: Never Smoker  . Smokeless tobacco: Not on file  . Alcohol use No  . Drug use: No  . Sexual activity: No   Other Topics Concern  . Not on file   Social History Narrative   From [facility]. Bedbound now and minimal verbal communication.   Family History  Problem Relation Age of Onset  . Dementia Mother   . CAD Brother   . Aortic aneurysm Brother   . Aortic aneurysm Father   . Kidney cancer Neg Hx   . Kidney disease Neg Hx   . Prostate cancer Neg Hx       VITAL SIGNS BP 128/68   Pulse 66   Temp 98.6 F (37 C)  (Oral)   Resp 16   Ht 5\' 7"  (1.702 m)   Wt 120 lb 2 oz (54.5 kg)   SpO2 98%   BMI 18.81 kg/m   Patient's Medications  New Prescriptions   No medications on file  Previous Medications   ACETAMINOPHEN (TYLENOL) 325 MG TABLET    Take 650 mg by mouth 3 (three) times daily. 325 mg every 6 hours prn   ASPIRIN EC 81 MG TABLET    Take 81 mg by mouth every evening.   CLONAZEPAM (KLONOPIN) 0.5 MG TABLET    Take 0.5 mg by mouth at bedtime.   CLONAZEPAM (KLONOPIN) 1 MG TABLET    Take 1 mg by mouth every morning.   DEXTROMETHORPHAN-QUINIDINE (NUEDEXTA) 20-10 MG CAPS    Take 1 tablet by mouth 2 (two) times daily.   DOCUSATE SODIUM (COLACE) 100 MG CAPSULE    Take 1 capsule (100 mg total) by mouth 2 (two) times daily.   ESCITALOPRAM (LEXAPRO) 20 MG TABLET    Take 20 mg by mouth daily.   HYDROCORTISONE (ANUSOL-HC) 25 MG SUPPOSITORY    Place 25 mg rectally  2 (two) times daily.   MEMANTINE (NAMENDA XR) 14 MG CP24 24 HR CAPSULE    Take 14 mg by mouth daily.   MIRTAZAPINE (REMERON) 15 MG TABLET    Take 15 mg by mouth every evening.    OXYCODONE (OXY IR/ROXICODONE) 5 MG IMMEDIATE RELEASE TABLET    Take 5 mg by mouth every 4 (four) hours as needed for severe pain.   OXYCODONE HCL, ABUSE DETER, (OXAYDO) 5 MG TABA    Take by mouth. Give 1 tablet by mouth before meals an at bedtime   POLYETHYLENE GLYCOL (MIRALAX / GLYCOLAX) PACKET    Take 17 g by mouth daily.   RISPERIDONE (RISPERDAL) 1 MG TABLET    Take 1 mg by mouth 2 (two) times daily.    RIVASTIGMINE (EXELON) 4.6 MG/24HR    Place onto the skin daily.   Modified Medications   No medications on file  Discontinued Medications   No medications on file     SIGNIFICANT DIAGNOSTIC EXAMS  02-06-15: ct of abdomen and pelvis: 1. Massive fecal impaction with extensive stool throughout the olon. 2. 12 mm mass on the lower pole of the left kidney worrisome for a small malignancy. 3. Probable cholelithiasis. 4. 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  02-06-15: ct of head: No acute intracranial process. Mega cisterna magna. Mild cortical volume loss.  02-06-15: right femur x-ray: Displaced/comminuted fracture of the right femoral neck, centered within the subcapital region but with extension into lateral portion of the right femoral head. At least some degree of associated impaction at the fracture site. Main component of the femoral head remains well positioned relative to the acetabulum.  02-06-15: right fibula/tibia x-ray: No acute abnormality. Atrophic musculature right lower leg. Talonavicular degenerative change.   02-06-15: chest x-ray: Mild left base subsegmental atelectasis .  02-07-15: renal ultrasound: 1. Subtle isoechoic-to-hyperechoic mass within the lower pole of the LEFT kidney, measuring 2.3 x 2 cm, not adequately visualized for definitive characterization, a possible correlate for the 1.2 cm mass seen on recent CT. Recommend more definitive characterization with either renal protocol CT (with and without contrast) or renal protocol MRI. 2. Right kidney appears normal. 3. Probable debris within the dependent aspects of the bladder. Bladder otherwise unremarkable  02-08-15; kub: Findings are compatible with the given history of constipation. Large stool ball distending the rectal vault, measuring at least 11 x 9 cm. Large amount of stool throughout the colon.  05-05-15: right hip fracture: no acute process   09-15-15: EKG: sinus tachycardia    LABS REVIEWED:   02-06-15: wbc 6.3; hgb 15.2; hct 46.1; mcv 90.7; plt 281; glucose 96; bun 9; creat 0.55; k+ 3.9; na++ 139; liver normal albumin 3.5 02-07-15: wbc 7.6; hgb 14.8; hct 44.0; mcv 94.0; plt 259; glucose 100; bun 10; creat 0.83; k+ 3.9; na++141 02-12-15: wbc 5.3; hgb 11.8; hct 34.8; mcv 90.5; plt 151; glucose 110; bun 10; creat 0.51; k+ 3.4; na++137  04-03-15: glucose 92; bun 13; creat 0.63; k+ 4.3; na++139  06-24-15: wbc 5.7; hgb 14.5; hct 43.7; mcv 91.0; plt 244; glucose 119;  bun 13; creat 0.65; k+ 4.1; na++ 137; liver normal albumin 3.7; hgb a1c 5.4  09-16-15: wbc 6.3; hgb 15.1; hct 45.5; mcv 92.9; plt 243; glucose 81; bun 29; creat 0.73; k+ 4.4 ;na++ 143 ast 9; alt 5; albumin 3.7    Review of Systems  Unable to perform ROS: dementia      Physical Exam  Constitutional: No  distress.  Frail   Eyes: Conjunctivae are normal.  Neck: Neck supple. No JVD present. No thyromegaly present.  Cardiovascular: Normal rate, regular rhythm and intact distal pulses.   Respiratory: Effort normal and breath sounds normal. No respiratory distress. He has no wheezes.  GI: Soft. Bowel sounds are normal. He exhibits no distension. There is no tenderness.  Musculoskeletal: He exhibits no edema.  Able to move all extremities  Is status post right femur fracture Jan 2017  Lymphadenopathy:    He has no cervical adenopathy.  Neurological:  Is aware of surroundings Is less restless;    Skin: Skin is warm and dry. He is not diaphoretic.     ASSESSMENT/PLAN  1. Left femoral head fracture: (jan 2017) will continue oxycodone 5 mg four times daily .  will monitor his status.   2. Major depression with psychotic features: will continue lexapro 20 mg daily remeron 15 mg nightly will continue risperdal  1 mg twice daily; will continue klonopin 1 mg in the AM and 0.5 mg nightly  to help with anxiety and akathisia. Will monitor his status.  Is followed by psych services   3. Vascular dementia: has cerebral palsy: he is slowly declining; is losing weight  will continue namenda xr 14 mg daily; will continue exelon patch 4.6 mg daily  asa 81 mg daily  His current weight is 120 pounds   4. Constipation: will continue miralax daily colace twice daily   5. Left renal mass: is being followed by urology; will monitor   6. Weight loss/failure to thrive: his current weight is 120 pounds; will continue supplements per facility protocol   7. 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 We will attempt to wean resident as apropriate   Synthia Innocenteborah Tylyn Stankovich NP [facility]  Contact [phone] Monday through Friday 8am- 5pm  After hours call [phone]

## 2016-01-07 ENCOUNTER — Ambulatory Visit: Admission: RE | Admit: 2016-01-07 | Payer: Medicare Other | Source: Ambulatory Visit

## 2016-01-07 ENCOUNTER — Encounter: Payer: Self-pay | Admitting: Adult Health

## 2016-01-07 ENCOUNTER — Non-Acute Institutional Stay (SKILLED_NURSING_FACILITY): Payer: Medicare Other | Admitting: Adult Health

## 2016-01-07 DIAGNOSIS — G808 Other cerebral palsy: Secondary | ICD-10-CM

## 2016-01-07 DIAGNOSIS — F323 Major depressive disorder, single episode, severe with psychotic features: Secondary | ICD-10-CM | POA: Diagnosis not present

## 2016-01-07 DIAGNOSIS — R627 Adult failure to thrive: Secondary | ICD-10-CM | POA: Diagnosis not present

## 2016-01-07 DIAGNOSIS — F482 Pseudobulbar affect: Secondary | ICD-10-CM | POA: Diagnosis not present

## 2016-01-07 DIAGNOSIS — R634 Abnormal weight loss: Secondary | ICD-10-CM | POA: Diagnosis not present

## 2016-01-07 DIAGNOSIS — S72002S Fracture of unspecified part of neck of left femur, sequela: Secondary | ICD-10-CM | POA: Diagnosis not present

## 2016-01-07 NOTE — Progress Notes (Signed)
Patient ID: Maurice LericheBarry W Droessler, male   DOB: 1960-11-05, 55 y.o.   MRN: [id number]   [facility] Room Number: 113-B Place of Service:  SNF (31)   CODE STATUS: DNR  No Known Allergies  Chief Complaint  Patient presents with  . Medical Management of Chronic Issues    Follow up    HPI:    Past Medical History:  Diagnosis Date  . Cerebral palsy (HCC)    bed bound status  . Dementia   . Depression   . HTN (hypertension)   . Hyperthyroidism   . Slow transit constipation 03/27/2015  . Vitamin B 12 deficiency     Past Surgical History:  Procedure Laterality Date  . Dental procedure    . foot surgeries     bilateral for cerebral palsy  . TOTAL HIP ARTHROPLASTY Right 02/09/2015   Procedure: TOTAL HIP ARTHROPLASTY ANTERIOR APPROACH;  Surgeon: Kennedy BuckerMichael Menz, MD;  Location: [facility] ORS;  Service: Orthopedics;  Laterality: Right;    Social History   Social History  . Marital status: Single    Spouse name: N/A  . Number of children: N/A  . Years of education: N/A   Occupational History  . Not on file.   Social History Main Topics  . Smoking status: Never Smoker  . Smokeless tobacco: Not on file  . Alcohol use No  . Drug use: No  . Sexual activity: No   Other Topics Concern  . Not on file   Social History Narrative   [facility]. Bedbound now and minimal verbal communication.   Family History  Problem Relation Age of Onset  . Dementia Mother   . CAD Brother   . Aortic aneurysm Brother   . Aortic aneurysm Father   . Kidney cancer Neg Hx   . Kidney disease Neg Hx   . Prostate cancer Neg Hx       VITAL SIGNS BP 123/70   Pulse 78   Temp 97.9 F (36.6 C) (Oral)   Resp 18   Ht 5\' 7"  (1.702 m)   Wt 120 lb (54.4 kg)   SpO2 98%   BMI 18.79 kg/m   Patient's Medications  New Prescriptions   No medications on file  Previous Medications   ACETAMINOPHEN (TYLENOL) 325 MG TABLET    Take 650 mg by mouth 3 (three) times daily. 325  mg every 6 hours prn   ASPIRIN EC 81 MG TABLET    Take 81 mg by mouth every evening.   CLONAZEPAM (KLONOPIN) 0.5 MG TABLET    Take 0.5 mg by mouth at bedtime.   CLONAZEPAM (KLONOPIN) 1 MG TABLET    Take 1 mg by mouth every morning.   DEXTROMETHORPHAN-QUINIDINE (NUEDEXTA) 20-10 MG CAPS    Take 1 tablet by mouth 2 (two) times daily.   DOCUSATE SODIUM (COLACE) 100 MG CAPSULE    Take 1 capsule (100 mg total) by mouth 2 (two) times daily.   ESCITALOPRAM (LEXAPRO) 20 MG TABLET    Take 20 mg by mouth daily.   HYDROCORTISONE (ANUSOL-HC) 25 MG SUPPOSITORY    Place 25 mg rectally 2 (two) times daily.   MEMANTINE (NAMENDA XR) 14 MG CP24 24 HR CAPSULE    Take 14 mg by mouth daily.   MIRTAZAPINE (REMERON) 15 MG TABLET    Take 15 mg by mouth every evening.    OXYCODONE (OXY IR/ROXICODONE) 5 MG IMMEDIATE RELEASE TABLET    Take 5  mg by mouth every 4 (four) hours as needed for severe pain.   OXYCODONE HCL, ABUSE DETER, (OXAYDO) 5 MG TABA    Take by mouth. Give 1 tablet by mouth before meals an at bedtime   POLYETHYLENE GLYCOL (MIRALAX / GLYCOLAX) PACKET    Take 17 g by mouth daily.   RISPERIDONE (RISPERDAL) 1 MG TABLET    Take 1 mg by mouth 2 (two) times daily.    RIVASTIGMINE (EXELON) 4.6 MG/24HR    Place onto the skin daily.   Modified Medications   No medications on file  Discontinued Medications   No medications on file     SIGNIFICANT DIAGNOSTIC EXAMS   02-06-15: ct of abdomen and pelvis: 1. Massive fecal impaction with extensive stool throughout the olon. 2. 12 mm mass on the lower pole of the left kidney worrisome for a small malignancy. 3. Probable cholelithiasis. 4. 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  02-06-15: ct of head: No acute intracranial process. Mega cisterna magna. Mild cortical volume loss.  02-06-15: right femur x-ray: Displaced/comminuted fracture of the right femoral neck, centered within the subcapital region but with extension into lateral portion of  the right femoral head. At least some degree of associated impaction at the fracture site. Main component of the femoral head remains well positioned relative to the acetabulum.  02-06-15: right fibula/tibia x-ray: No acute abnormality. Atrophic musculature right lower leg. Talonavicular degenerative change.   02-06-15: chest x-ray: Mild left base subsegmental atelectasis .  02-07-15: renal ultrasound: 1. Subtle isoechoic-to-hyperechoic mass within the lower pole of the LEFT kidney, measuring 2.3 x 2 cm, not adequately visualized for definitive characterization, a possible correlate for the 1.2 cm mass seen on recent CT. Recommend more definitive characterization with either renal protocol CT (with and without contrast) or renal protocol MRI. 2. Right kidney appears normal. 3. Probable debris within the dependent aspects of the bladder. Bladder otherwise unremarkable  02-08-15; kub: Findings are compatible with the given history of constipation. Large stool ball distending the rectal vault, measuring at least 11 x 9 cm. Large amount of stool throughout the colon.  05-05-15: right hip fracture: no acute process   09-15-15: EKG: sinus tachycardia    LABS REVIEWED:   02-06-15: wbc 6.3; hgb 15.2; hct 46.1; mcv 90.7; plt 281; glucose 96; bun 9; creat 0.55; k+ 3.9; na++ 139; liver normal albumin 3.5 02-07-15: wbc 7.6; hgb 14.8; hct 44.0; mcv 94.0; plt 259; glucose 100; bun 10; creat 0.83; k+ 3.9; na++141 02-12-15: wbc 5.3; hgb 11.8; hct 34.8; mcv 90.5; plt 151; glucose 110; bun 10; creat 0.51; k+ 3.4; na++137  04-03-15: glucose 92; bun 13; creat 0.63; k+ 4.3; na++139  06-24-15: wbc 5.7; hgb 14.5; hct 43.7; mcv 91.0; plt 244; glucose 119; bun 13; creat 0.65; k+ 4.1; na++ 137; liver normal albumin 3.7; hgb a1c 5.4  09-16-15: wbc 6.3; hgb 15.1; hct 45.5; mcv 92.9; plt 243; glucose 81; bun 29; creat 0.73; k+ 4.4 ;na++ 143 ast 9; alt 5; albumin 3.7    Review of Systems  Unable to perform ROS: dementia       Physical Exam  Constitutional: No distress.  Frail   Eyes: Conjunctivae are normal.  Neck: Neck supple. No JVD present. No thyromegaly present.  Cardiovascular: Normal rate, regular rhythm and intact distal pulses.   Respiratory: Effort normal and breath sounds normal. No respiratory distress. He has no wheezes.  GI: Soft. Bowel sounds are normal. He exhibits no distension.  There is no tenderness.  Musculoskeletal: He exhibits no edema.  Able to move all extremities  Is status post right femur fracture Jan 2017  Lymphadenopathy:    He has no cervical adenopathy.  Neurological:  Is aware of surroundings Is less restless;    Skin: Skin is warm and dry. He is not diaphoretic.     ASSESSMENT/PLAN  1. Left femoral head fracture: (jan 2017) will continue oxycodone 5 mg four time daily he has done better since making his pain medication routine.  will monitor his status.   2. Major depression with psychotic features: will continue lexapro 20 mg daily remeron 15 mg nightly will continue risperdal  1 mg twice daily; will continue klonopin 1 mg in the AM and 0.5 mg nightly  to help with anxiety and akathisia. Will monitor his status.  Is followed by psych services   3. Vascular dementia: has cerebral palsy: he is slowly declining; is losing weight  will continue namenda xr 14 mg daily; will continue exelon patch 4.6 mg daily  asa 81 mg daily  His current weight is 109 pounds   4. Constipation: will continue miralax daily colace twice daily   5. Left renal mass: is being followed by urology; will monitor   6. Weight loss/failure to thrive: his current weight is 120 pounds; will continue supplements per facility protocol   7. PBA:  will conitnue neudexta 20-10 mg twice daily            MD is aware of resident's narcotic use and is in agreement with current plan of care. We will attempt to wean resident as apropriate   Synthia Innocenteborah Green NP [facility]  Contact  [phone] Monday through Friday 8am- 5pm  After hours call [phone]

## 2016-01-11 ENCOUNTER — Ambulatory Visit: Payer: Medicare Other

## 2016-01-18 ENCOUNTER — Ambulatory Visit: Admission: RE | Admit: 2016-01-18 | Payer: Medicare Other | Source: Ambulatory Visit

## 2016-01-28 ENCOUNTER — Non-Acute Institutional Stay (SKILLED_NURSING_FACILITY): Payer: Medicare Other | Admitting: Adult Health

## 2016-01-28 ENCOUNTER — Encounter: Payer: Self-pay | Admitting: Adult Health

## 2016-01-28 DIAGNOSIS — N2889 Other specified disorders of kidney and ureter: Secondary | ICD-10-CM

## 2016-01-28 DIAGNOSIS — R627 Adult failure to thrive: Secondary | ICD-10-CM | POA: Diagnosis not present

## 2016-01-28 DIAGNOSIS — S72002S Fracture of unspecified part of neck of left femur, sequela: Secondary | ICD-10-CM | POA: Diagnosis not present

## 2016-01-28 DIAGNOSIS — G2571 Drug induced akathisia: Secondary | ICD-10-CM

## 2016-01-28 DIAGNOSIS — F0280 Dementia in other diseases classified elsewhere without behavioral disturbance: Secondary | ICD-10-CM

## 2016-01-28 DIAGNOSIS — G300 Alzheimer's disease with early onset: Secondary | ICD-10-CM | POA: Diagnosis not present

## 2016-01-28 DIAGNOSIS — K5901 Slow transit constipation: Secondary | ICD-10-CM

## 2016-01-28 DIAGNOSIS — T43505A Adverse effect of unspecified antipsychotics and neuroleptics, initial encounter: Secondary | ICD-10-CM

## 2016-01-28 DIAGNOSIS — G894 Chronic pain syndrome: Secondary | ICD-10-CM

## 2016-01-28 DIAGNOSIS — G808 Other cerebral palsy: Secondary | ICD-10-CM

## 2016-01-28 NOTE — Progress Notes (Signed)
Location:   [facility] Room Number: 113 B Place of Service:  SNF (31)   CODE STATUS: DNR  No Known Allergies  Chief Complaint  Patient presents with  . Medical Management of Chronic Issues    HPI:  He is a long term resident of this facility being seen for the management of his chronic illnesses. He is unable to participate in the hpi or ros. He does have fewer episodes of yelling out. He does get out of bed on a daily basis. There are no nursing concerns at this time.    Past Medical History:  Diagnosis Date  . Cerebral palsy (HCC)    bed bound status  . Dementia   . Depression   . HTN (hypertension)   . Hyperthyroidism   . Slow transit constipation 03/27/2015  . Vitamin B 12 deficiency     Past Surgical History:  Procedure Laterality Date  . Dental procedure    . foot surgeries     bilateral for cerebral palsy  . TOTAL HIP ARTHROPLASTY Right 02/09/2015   Procedure: TOTAL HIP ARTHROPLASTY ANTERIOR APPROACH;  Surgeon: Kennedy Bucker, MD;  Location: [facility] ORS;  Service: Orthopedics;  Laterality: Right;    Social History   Social History  . Marital status: Single    Spouse name: N/A  . Number of children: N/A  . Years of education: N/A   Occupational History  . Not on file.   Social History Main Topics  . Smoking status: Never Smoker  . Smokeless tobacco: Not on file  . Alcohol use No  . Drug use: No  . Sexual activity: No   Other Topics Concern  . Not on file   Social History Narrative   From [facility]. Bedbound now and minimal verbal communication.   Family History  Problem Relation Age of Onset  . Dementia Mother   . CAD Brother   . Aortic aneurysm Brother   . Aortic aneurysm Father   . Kidney cancer Neg Hx   . Kidney disease Neg Hx   . Prostate cancer Neg Hx       VITAL SIGNS BP 105/70   Pulse 72   Temp 98.1 F (36.7 C)   Resp 16   Ht 5\' 7"  (1.702 m)   Wt 118 lb (53.5 kg)   SpO2 98%   BMI 18.48 kg/m    Patient's Medications  New Prescriptions   No medications on file  Previous Medications   ACETAMINOPHEN (TYLENOL) 325 MG TABLET    Take 650 mg by mouth 3 (three) times daily. 325 mg every 6 hours prn   ASPIRIN EC 81 MG TABLET    Take 81 mg by mouth every evening.   CLONAZEPAM (KLONOPIN) 0.5 MG TABLET    Take 0.5 mg by mouth at bedtime.   CLONAZEPAM (KLONOPIN) 1 MG TABLET    Take 1 mg by mouth every morning.   DEXTROMETHORPHAN-QUINIDINE (NUEDEXTA) 20-10 MG CAPS    Take 1 tablet by mouth 2 (two) times daily.   DOCUSATE SODIUM (COLACE) 100 MG CAPSULE    Take 1 capsule (100 mg total) by mouth 2 (two) times daily.   ESCITALOPRAM (LEXAPRO) 20 MG TABLET    Take 20 mg by mouth daily.   HYDROCORTISONE (ANUSOL-HC) 25 MG SUPPOSITORY    Place 25 mg rectally 2 (two) times daily.   MEMANTINE (NAMENDA XR) 14 MG CP24 24 HR CAPSULE    Take 14 mg by mouth daily.  MIRTAZAPINE (REMERON) 15 MG TABLET    Take 15 mg by mouth every evening.    OXYCODONE (OXY IR/ROXICODONE) 5 MG IMMEDIATE RELEASE TABLET    Take 5 mg by mouth every 4 (four) hours as needed for severe pain.   OXYCODONE HCL, ABUSE DETER, (OXAYDO) 5 MG TABA    Take by mouth. Give 1 tablet by mouth before meals an at bedtime   POLYETHYLENE GLYCOL (MIRALAX / GLYCOLAX) PACKET    Take 17 g by mouth daily.   RISPERIDONE (RISPERDAL) 1 MG TABLET    Take 1 mg by mouth 2 (two) times daily.    RIVASTIGMINE (EXELON) 4.6 MG/24HR    Place 4.6 mg onto the skin daily. Apply 1 patch daily for dementia  Modified Medications   No medications on file  Discontinued Medications   No medications on file     SIGNIFICANT DIAGNOSTIC EXAMS   02-06-15: ct of abdomen and pelvis: 1. Massive fecal impaction with extensive stool throughout the olon. 2. 12 mm mass on the lower pole of the left kidney worrisome for a small malignancy. 3. Probable cholelithiasis. 4. 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  02-06-15: ct of head: No acute intracranial  process. Mega cisterna magna. Mild cortical volume loss.  02-06-15: right femur x-ray: Displaced/comminuted fracture of the right femoral neck, centered within the subcapital region but with extension into lateral portion of the right femoral head. At least some degree of associated impaction at the fracture site. Main component of the femoral head remains well positioned relative to the acetabulum.  02-06-15: right fibula/tibia x-ray: No acute abnormality. Atrophic musculature right lower leg. Talonavicular degenerative change.   02-06-15: chest x-ray: Mild left base subsegmental atelectasis .  02-07-15: renal ultrasound: 1. Subtle isoechoic-to-hyperechoic mass within the lower pole of the LEFT kidney, measuring 2.3 x 2 cm, not adequately visualized for definitive characterization, a possible correlate for the 1.2 cm mass seen on recent CT. Recommend more definitive characterization with either renal protocol CT (with and without contrast) or renal protocol MRI. 2. Right kidney appears normal. 3. Probable debris within the dependent aspects of the bladder. Bladder otherwise unremarkable  02-08-15; kub: Findings are compatible with the given history of constipation. Large stool ball distending the rectal vault, measuring at least 11 x 9 cm. Large amount of stool throughout the colon.  05-05-15: right hip fracture: no acute process   09-15-15: EKG: sinus tachycardia    LABS REVIEWED:   02-06-15: wbc 6.3; hgb 15.2; hct 46.1; mcv 90.7; plt 281; glucose 96; bun 9; creat 0.55; k+ 3.9; na++ 139; liver normal albumin 3.5 02-07-15: wbc 7.6; hgb 14.8; hct 44.0; mcv 94.0; plt 259; glucose 100; bun 10; creat 0.83; k+ 3.9; na++141 02-12-15: wbc 5.3; hgb 11.8; hct 34.8; mcv 90.5; plt 151; glucose 110; bun 10; creat 0.51; k+ 3.4; na++137  04-03-15: glucose 92; bun 13; creat 0.63; k+ 4.3; na++139  06-24-15: wbc 5.7; hgb 14.5; hct 43.7; mcv 91.0; plt 244; glucose 119; bun 13; creat 0.65; k+ 4.1; na++ 137; liver normal  albumin 3.7; hgb a1c 5.4  09-16-15: wbc 6.3; hgb 15.1; hct 45.5; mcv 92.9; plt 243; glucose 81; bun 29; creat 0.73; k+ 4.4 ;na++ 143 ast 9; alt 5; albumin 3.7    Review of Systems  Unable to perform ROS: dementia      Physical Exam  Constitutional: No distress.  Frail   Eyes: Conjunctivae are normal.  Neck: Neck supple. No JVD present. No thyromegaly  present.  Cardiovascular: Normal rate, regular rhythm and intact distal pulses.   Respiratory: Effort normal and breath sounds normal. No respiratory distress. He has no wheezes.  GI: Soft. Bowel sounds are normal. He exhibits no distension. There is no tenderness.  Musculoskeletal: He exhibits no edema.  Able to move all extremities  Is status post right femur fracture Jan 2017  Lymphadenopathy:    He has no cervical adenopathy.  Neurological:  Is aware of surroundings Is less restless;    Skin: Skin is warm and dry. He is not diaphoretic.     ASSESSMENT/PLAN  1. Left femoral head fracture: (jan 2017) with chronic pain will continue oxycodone 5 mg four time daily   will monitor his status.   2. Major depression with psychotic features: will continue lexapro 20 mg daily remeron 15 mg nightly will continue risperdal  1 mg twice daily; will continue klonopin 1 mg in the AM and 0.5 mg nightly  to help with anxiety and akathisia. Will monitor his status.  Is followed by psych services   3. Vascular dementia: has cerebral palsy: he is slowly declining; is losing weight  will continue namenda xr 14 mg daily; will continue exelon patch 4.6 mg daily  asa 81 mg daily  His current weight is 118 pounds   4. Constipation: will continue miralax daily colace twice daily   5. Left renal mass: is being followed by urology; will monitor  Is due for ct scan   6. Weight loss/failure to thrive: his current weight is 118 pounds; will continue supplements per facility protocol   7. PBA:  will conitnue neudexta 20-10 mg twice daily    MD is aware  of resident's narcotic use and is in agreement with current plan of care. We will attempt to wean resident as apropriate   Synthia Innocenteborah Green NP [facility]  Contact [phone] Monday through Friday 8am- 5pm  After hours call [phone]

## 2016-02-02 ENCOUNTER — Ambulatory Visit: Admission: RE | Admit: 2016-02-02 | Payer: Medicare Other | Source: Ambulatory Visit

## 2016-02-05 ENCOUNTER — Ambulatory Visit: Payer: Medicare Other | Admitting: Urology

## 2016-02-09 ENCOUNTER — Ambulatory Visit
Admission: RE | Admit: 2016-02-09 | Discharge: 2016-02-09 | Disposition: A | Discharge status: Home / Self Care | Payer: Medicare Other | Source: Ambulatory Visit | Attending: Urology | Admitting: Urology

## 2016-02-09 DIAGNOSIS — K5641 Fecal impaction: Secondary | ICD-10-CM | POA: Diagnosis not present

## 2016-02-09 DIAGNOSIS — N2889 Other specified disorders of kidney and ureter: Secondary | ICD-10-CM | POA: Diagnosis not present

## 2016-02-09 MED ORDER — IOPAMIDOL (ISOVUE-370) INJECTION 76%
100.0000 mL | Freq: Once | INTRAVENOUS | Status: AC | PRN
  Administered 2016-02-09: 100 mL via INTRAVENOUS

## 2016-02-22 ENCOUNTER — Other Ambulatory Visit: Payer: Self-pay | Admitting: *Deleted

## 2016-02-22 MED ORDER — OXYCODONE HCL 5 MG PO TABS: ORAL_TABLET | ORAL | 0 refills | Status: DC

## 2016-02-22 NOTE — Telephone Encounter (Signed)
[facility] #: [phone] Fax#: [phone]  

## 2016-02-26 ENCOUNTER — Non-Acute Institutional Stay (SKILLED_NURSING_FACILITY): Payer: Medicare Other | Admitting: Adult Health

## 2016-02-26 ENCOUNTER — Encounter: Payer: Self-pay | Admitting: Adult Health

## 2016-02-26 DIAGNOSIS — S72002S Fracture of unspecified part of neck of left femur, sequela: Secondary | ICD-10-CM

## 2016-02-26 DIAGNOSIS — F482 Pseudobulbar affect: Secondary | ICD-10-CM

## 2016-02-26 DIAGNOSIS — F323 Major depressive disorder, single episode, severe with psychotic features: Secondary | ICD-10-CM | POA: Diagnosis not present

## 2016-02-26 DIAGNOSIS — G300 Alzheimer's disease with early onset: Secondary | ICD-10-CM | POA: Diagnosis not present

## 2016-02-26 DIAGNOSIS — G894 Chronic pain syndrome: Secondary | ICD-10-CM

## 2016-02-26 DIAGNOSIS — G2571 Drug induced akathisia: Secondary | ICD-10-CM | POA: Diagnosis not present

## 2016-02-26 DIAGNOSIS — G808 Other cerebral palsy: Secondary | ICD-10-CM | POA: Diagnosis not present

## 2016-02-26 DIAGNOSIS — T43505A Adverse effect of unspecified antipsychotics and neuroleptics, initial encounter: Secondary | ICD-10-CM | POA: Diagnosis not present

## 2016-02-26 DIAGNOSIS — F0280 Dementia in other diseases classified elsewhere without behavioral disturbance: Secondary | ICD-10-CM

## 2016-02-26 NOTE — Progress Notes (Signed)
Location:  [facility] Room Number: 113 B Place of Service:  SNF (31)   CODE STATUS: DNR  No Known Allergies  Chief Complaint  Patient presents with  . Medical Management of Chronic Issues    Routine Visit    HPI:  He is a long term resident of this facility being seen for the management of his chronic illnesses. He does have some episodes of yelling out; but those are less in frequency. He is unable to participate in the hpi or ros. There are no nursing concerns at this time.    Past Medical History:  Diagnosis Date  . Cerebral palsy (HCC)    bed bound status  . Dementia   . Depression   . HTN (hypertension)   . Hyperthyroidism   . Slow transit constipation 03/27/2015  . Vitamin B 12 deficiency     Past Surgical History:  Procedure Laterality Date  . Dental procedure    . foot surgeries     bilateral for cerebral palsy  . TOTAL HIP ARTHROPLASTY Right 02/09/2015   Procedure: TOTAL HIP ARTHROPLASTY ANTERIOR APPROACH;  Surgeon: Kennedy Bucker, MD;  Location: [facility] ORS;  Service: Orthopedics;  Laterality: Right;    Social History   Social History  . Marital status: Single    Spouse name: N/A  . Number of children: N/A  . Years of education: N/A   Occupational History  . Not on file.   Social History Main Topics  . Smoking status: Never Smoker  . Smokeless tobacco: Never Used  . Alcohol use No  . Drug use: No  . Sexual activity: No   Other Topics Concern  . Not on file   Social History Narrative   From [facility]. Bedbound now and minimal verbal communication.   Family History  Problem Relation Age of Onset  . Dementia Mother   . CAD Brother   . Aortic aneurysm Brother   . Aortic aneurysm Father   . Kidney cancer Neg Hx   . Kidney disease Neg Hx   . Prostate cancer Neg Hx       VITAL SIGNS BP 122/77   Pulse 87   Temp 98.9 F (37.2 C)   Resp 18   Ht 5\' 7"  (1.702 m)   Wt 120 lb 4.8 oz (54.6 kg)   SpO2 91%   BMI  18.84 kg/m   Patient's Medications  New Prescriptions   No medications on file  Previous Medications   ACETAMINOPHEN (TYLENOL) 325 MG TABLET    Take 650 mg by mouth 3 (three) times daily. 325 mg every 6 hours prn   ASPIRIN EC 81 MG TABLET    Take 81 mg by mouth every evening.   CLONAZEPAM (KLONOPIN) 0.5 MG TABLET    Take 0.5 mg by mouth at bedtime.   CLONAZEPAM (KLONOPIN) 1 MG TABLET    Take 1 mg by mouth every morning.   DEXTROMETHORPHAN-QUINIDINE (NUEDEXTA) 20-10 MG CAPS    Take 1 tablet by mouth 2 (two) times daily.   DOCUSATE SODIUM (COLACE) 100 MG CAPSULE    Take 1 capsule (100 mg total) by mouth 2 (two) times daily.   ESCITALOPRAM (LEXAPRO) 20 MG TABLET    Take 20 mg by mouth daily.   HYDROCORTISONE (ANUSOL-HC) 25 MG SUPPOSITORY    Place 25 mg rectally 2 (two) times daily.   MEMANTINE (NAMENDA XR) 14 MG CP24 24 HR CAPSULE    Take 14 mg by mouth  daily.   MIRTAZAPINE (REMERON) 15 MG TABLET    Take 15 mg by mouth every evening.    OXYCODONE (OXY IR/ROXICODONE) 5 MG IMMEDIATE RELEASE TABLET    Take one tablet by mouth before meals and at bedtime for pain   OXYCODONE (OXY IR/ROXICODONE) 5 MG IMMEDIATE RELEASE TABLET    Give 5 mg by mouth every 4 hours as needed for pain management. Give 1 tab for mild pain and 2 tabs for moderate pain as needed for breakthrough pain.   POLYETHYLENE GLYCOL (MIRALAX / GLYCOLAX) PACKET    Take 17 g by mouth daily.   RISPERIDONE (RISPERDAL) 1 MG TABLET    Take 1 mg by mouth 2 (two) times daily.    RIVASTIGMINE (EXELON) 4.6 MG/24HR    Place 4.6 mg onto the skin daily. Apply 1 patch daily for dementia  Modified Medications   No medications on file  Discontinued Medications   OXYCODONE HCL, ABUSE DETER, (OXAYDO) 5 MG TABA    Take by mouth. Give 1 tablet by mouth before meals an at bedtime     SIGNIFICANT DIAGNOSTIC EXAMS   02-06-15: ct of abdomen and pelvis: 1. Massive fecal impaction with extensive stool throughout the olon. 2. 12 mm mass on the lower pole  of the left kidney worrisome for a small malignancy. 3. Probable cholelithiasis. 4. 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  02-06-15: ct of head: No acute intracranial process. Mega cisterna magna. Mild cortical volume loss.  02-06-15: right femur x-ray: Displaced/comminuted fracture of the right femoral neck, centered within the subcapital region but with extension into lateral portion of the right femoral head. At least some degree of associated impaction at the fracture site. Main component of the femoral head remains well positioned relative to the acetabulum.  02-06-15: right fibula/tibia x-ray: No acute abnormality. Atrophic musculature right lower leg. Talonavicular degenerative change.   02-06-15: chest x-ray: Mild left base subsegmental atelectasis .  02-07-15: renal ultrasound: 1. Subtle isoechoic-to-hyperechoic mass within the lower pole of the LEFT kidney, measuring 2.3 x 2 cm, not adequately visualized for definitive characterization, a possible correlate for the 1.2 cm mass seen on recent CT. Recommend more definitive characterization with either renal protocol CT (with and without contrast) or renal protocol MRI. 2. Right kidney appears normal. 3. Probable debris within the dependent aspects of the bladder. Bladder otherwise unremarkable  02-08-15; kub: Findings are compatible with the given history of constipation. Large stool ball distending the rectal vault, measuring at least 11 x 9 cm. Large amount of stool throughout the colon.  05-05-15: right hip fracture: no acute process   09-15-15: EKG: sinus tachycardia   02-09-16: ct of abdomen and pelvis: Very limited examination due to patient motion. Incomplete assessment of the lower pole left renal lesion but most likely a small enhancing renal neoplasm. However, no significant change when compared to the study from 1 year ago. Recommend repeat CT scan in 1 year. Large amount of stool throughout colon and massive  fecal impaction in the rectum.   LABS REVIEWED:   02-06-15: wbc 6.3; hgb 15.2; hct 46.1; mcv 90.7; plt 281; glucose 96; bun 9; creat 0.55; k+ 3.9; na++ 139; liver normal albumin 3.5 02-07-15: wbc 7.6; hgb 14.8; hct 44.0; mcv 94.0; plt 259; glucose 100; bun 10; creat 0.83; k+ 3.9; na++141 02-12-15: wbc 5.3; hgb 11.8; hct 34.8; mcv 90.5; plt 151; glucose 110; bun 10; creat 0.51; k+ 3.4; na++137  04-03-15: glucose 92; bun 13;  creat 0.63; k+ 4.3; na++139  06-24-15: wbc 5.7; hgb 14.5; hct 43.7; mcv 91.0; plt 244; glucose 119; bun 13; creat 0.65; k+ 4.1; na++ 137; liver normal albumin 3.7; hgb a1c 5.4  09-16-15: wbc 6.3; hgb 15.1; hct 45.5; mcv 92.9; plt 243; glucose 81; bun 29; creat 0.73; k+ 4.4 ;na++ 143 ast 9; alt 5; albumin 3.7    Review of Systems  Unable to perform ROS: dementia      Physical Exam  Constitutional: No distress.  Frail   Eyes: Conjunctivae are normal.  Neck: Neck supple. No JVD present. No thyromegaly present.  Cardiovascular: Normal rate, regular rhythm and intact distal pulses.   Respiratory: Effort normal and breath sounds normal. No respiratory distress. He has no wheezes.  GI: Soft. Bowel sounds are normal. He exhibits no distension. There is no tenderness.  Musculoskeletal: He exhibits no edema.  Able to move all extremities  Is status post right femur fracture Jan 2017  Lymphadenopathy:    He has no cervical adenopathy.  Neurological:  Is aware of surroundings Is less restless;    Skin: Skin is warm and dry. He is not diaphoretic.     ASSESSMENT/PLAN  1. Left femoral head fracture: (jan 2017) with chronic pain will continue oxycodone 5 mg four time daily   will monitor his status.   2. Major depression with psychotic features: will continue lexapro 20 mg daily remeron 15 mg nightly will continue risperdal  1 mg twice daily; will continue klonopin 1 mg in the AM and 0.5 mg nightly  to help with anxiety and akathisia. Will monitor his status.  Is followed by  psych services   3. Vascular dementia: has cerebral palsy: his weight is currently stable.   will continue namenda xr 14 mg daily; will continue exelon patch 4.6 mg daily  asa 81 mg daily  His current weight is 120 pounds   4. Constipation: will continue miralax daily colace twice daily   5. Left renal mass: is being followed by urology; will monitor has ct scan on 02-09-16; will repeat in one year.   6. Weight loss/failure to thrive: his current weight is 120pounds; will continue supplements per facility protocol   7. PBA:  will conitnue neudexta 20-10 mg twice daily            MD is aware of resident's narcotic use and is in agreement with current plan of care. We will attempt to wean resident as apropriate   Synthia Innocenteborah Erasmo Vertz NP [facility]  Contact [phone] Monday through Friday 8am- 5pm  After hours call [phone]

## 2016-02-29 ENCOUNTER — Encounter: Payer: Self-pay | Admitting: Urology

## 2016-02-29 ENCOUNTER — Ambulatory Visit: Payer: Medicare Other | Admitting: Urology

## 2016-04-05 ENCOUNTER — Non-Acute Institutional Stay (SKILLED_NURSING_FACILITY): Payer: Medicare Other | Admitting: Internal Medicine

## 2016-04-05 ENCOUNTER — Encounter: Payer: Self-pay | Admitting: Internal Medicine

## 2016-04-05 DIAGNOSIS — F0280 Dementia in other diseases classified elsewhere without behavioral disturbance: Secondary | ICD-10-CM | POA: Diagnosis not present

## 2016-04-05 DIAGNOSIS — G808 Other cerebral palsy: Secondary | ICD-10-CM | POA: Diagnosis not present

## 2016-04-05 DIAGNOSIS — G894 Chronic pain syndrome: Secondary | ICD-10-CM | POA: Diagnosis not present

## 2016-04-05 DIAGNOSIS — F323 Major depressive disorder, single episode, severe with psychotic features: Secondary | ICD-10-CM | POA: Diagnosis not present

## 2016-04-05 DIAGNOSIS — R627 Adult failure to thrive: Secondary | ICD-10-CM

## 2016-04-05 DIAGNOSIS — G300 Alzheimer's disease with early onset: Secondary | ICD-10-CM | POA: Diagnosis not present

## 2016-04-05 DIAGNOSIS — F482 Pseudobulbar affect: Secondary | ICD-10-CM

## 2016-04-05 NOTE — Progress Notes (Signed)
Patient ID: Maurice Clayton, male   DOB: 04/14/1960, 56 y.o.   MRN: [id number]    DATE:  04/05/2016  Location:    [facility] Room Number: 113 B Place of Service: SNF (31)   Extended Emergency Contact Information Primary Emergency Contact: Garfield Cornea [place] Home Phone: [phone] Relation: Other Secondary Emergency Contact: Sandi Mealy Address: [address]          [place], [place] [address] [place] Home Phone: [phone] Relation: Other  Advanced Directive information Does Patient Have a Medical Advance Directive?: Yes, Type of Advance Directive: Out of facility DNR (pink MOST or yellow form), Pre-existing out of facility DNR order (yellow form or pink MOST form): Yellow form placed in chart (order not valid for inpatient use)  Chief Complaint  Patient presents with  . Medical Management of Chronic Issues    Routine Visit- [facility]    HPI:  56 yo long term resident seen today for f/u. He has no concerns today. No falls. He is a poor historian due to dementia. Hx obtained from chart. No nursing concerns.  Left femoral head fracture (Jan 2017) with chronic pain - pain controlled on oxycodone 5 mg four time daily. He does benefit from this regimen.   Major depression with psychotic features - mod stable on lexapro 20 mg daily; remeron 15 mg nightly; risperdal  1 mg twice daily; klonopin 1 mg in the AM and 0.5 mg nightly for anxiety and akathisia; followed by psych services   Vascular dementia/ cerebral palsy - takes namenda xr 14 mg daily; exelon patch 4.6 mg daily. He takes ASA 81 mg daily. Weight is 116 lb   Constipation - stable on miralax daily; colace twice daily   Left renal mass - followed by urology; last CT scan on 02-09-16 stable and will repeat in one year.   Weight loss/FTT -  current weight is 116 lbs. He gets nutritional supplements per facility protocol   PBA - stable on neudexta 20-10 mg twice daily      Past Medical History:  Diagnosis Date  . Cerebral palsy (HCC)    bed bound status  . Dementia   . Depression   . HTN (hypertension)   . Hyperthyroidism   . Slow transit constipation 03/27/2015  . Vitamin B 12 deficiency     Past Surgical History:  Procedure Laterality Date  . Dental procedure    . foot surgeries     bilateral for cerebral palsy  . TOTAL HIP ARTHROPLASTY Right 02/09/2015   Procedure: TOTAL HIP ARTHROPLASTY ANTERIOR APPROACH;  Surgeon: Kennedy Bucker, MD;  Location: [facility] ORS;  Service: Orthopedics;  Laterality: Right;    Patient Care Team: Kirt Boys, DO as PCP - General (Internal Medicine) [facility] (Skilled Nursing Facility)  Social History   Social History  . Marital status: Single    Spouse name: N/A  . Number of children: N/A  . Years of education: N/A   Occupational History  . Not on file.   Social History Main Topics  . Smoking status: Never Smoker  . Smokeless tobacco: Never Used  . Alcohol use No  . Drug use: No  . Sexual activity: No   Other Topics Concern  . Not on file   Social History Narrative   From [facility]. Bedbound now and minimal verbal communication.     reports that he has never smoked. He has never used smokeless tobacco.  He reports that he does not drink alcohol or use drugs.  Family History  Problem Relation Age of Onset  . Dementia Mother   . CAD Brother   . Aortic aneurysm Brother   . Aortic aneurysm Father   . Kidney cancer Neg Hx   . Kidney disease Neg Hx   . Prostate cancer Neg Hx    Family Status  Relation Status  . Mother   . Brother   . Brother   . Father   . Neg Hx     Immunization History  Administered Date(s) Administered  . Influenza Split 10/24/2014  . Influenza-Unspecified 12/03/2015    No Known Allergies  Medications: Patient's Medications  New Prescriptions   No medications on file  Previous Medications   ACETAMINOPHEN (TYLENOL) 325 MG TABLET     Take 650 mg by mouth 3 (three) times daily. 325 mg every 6 hours prn   ASPIRIN EC 81 MG TABLET    Take 81 mg by mouth every evening.   CLONAZEPAM (KLONOPIN) 0.5 MG TABLET    Take 0.5 mg by mouth at bedtime.   CLONAZEPAM (KLONOPIN) 1 MG TABLET    Take 1 mg by mouth every morning.   DEXTROMETHORPHAN-QUINIDINE (NUEDEXTA) 20-10 MG CAPS    Take 1 tablet by mouth 2 (two) times daily.   DOCUSATE SODIUM (COLACE) 100 MG CAPSULE    Take 1 capsule (100 mg total) by mouth 2 (two) times daily.   ESCITALOPRAM (LEXAPRO) 20 MG TABLET    Take 20 mg by mouth daily.   HYDROCORTISONE (ANUSOL-HC) 25 MG SUPPOSITORY    Place 25 mg rectally 2 (two) times daily as needed.    MEMANTINE (NAMENDA XR) 14 MG CP24 24 HR CAPSULE    Take 14 mg by mouth daily.   MIRTAZAPINE (REMERON) 15 MG TABLET    Take 15 mg by mouth every evening.    OXYCODONE (OXY IR/ROXICODONE) 5 MG IMMEDIATE RELEASE TABLET    Take one tablet by mouth before meals and at bedtime for pain   OXYCODONE (OXY IR/ROXICODONE) 5 MG IMMEDIATE RELEASE TABLET    Take 5 mg by mouth every 4 (four) hours as needed for moderate pain or severe pain. 1 tab for mild pain and 2 tab for moderate   POLYETHYLENE GLYCOL (MIRALAX / GLYCOLAX) PACKET    Take 17 g by mouth daily.   RISPERIDONE (RISPERDAL) 1 MG TABLET    Take 1 mg by mouth 2 (two) times daily.    RIVASTIGMINE (EXELON) 4.6 MG/24HR    Place 4.6 mg onto the skin daily. Apply 1 patch daily for dementia  Modified Medications   No medications on file  Discontinued Medications   OXYCODONE (OXY IR/ROXICODONE) 5 MG IMMEDIATE RELEASE TABLET    Give 5 mg by mouth every 4 hours as needed for pain management. Give 1 tab for mild pain and 2 tabs for moderate pain as needed for breakthrough pain.    Review of Systems  Unable to perform ROS: Dementia    Vitals:   04/05/16 1204  BP: 101/62  Pulse: 71  Resp: 18  Temp: 98.1 F (36.7 C)  TempSrc: Oral  SpO2: 97%  Weight: 116 lb 9.6 oz (52.9 kg)   Body mass index is 18.26  kg/m.  Physical Exam  Constitutional:  Frail appearing, sitting in w/c in NAD  HENT:  Mouth/Throat: Oropharynx is clear and moist.  Eyes: Pupils are equal, round, and reactive to light. No scleral icterus.  Neck: Neck  supple. Carotid bruit is not present. No thyromegaly present.  Cardiovascular: Normal rate, regular rhythm, normal heart sounds and intact distal pulses.  Exam reveals no gallop and no friction rub.   No murmur heard. No LE edema b/l. No calf TTP  Pulmonary/Chest: Effort normal and breath sounds normal. He has no wheezes. He has no rales. He exhibits no tenderness.  Abdominal: Soft. Bowel sounds are normal. He exhibits no distension, no abdominal bruit, no pulsatile midline mass and no mass. There is no tenderness. There is no rebound and no guarding.  Musculoskeletal: He exhibits edema. He exhibits no tenderness.  Contractures of extremities present.   Lymphadenopathy:    He has no cervical adenopathy.  Neurological: He is alert.  Skin: Skin is warm and dry. No rash noted.  Psychiatric: He has a normal mood and affect. His behavior is normal.     Labs reviewed: No visits with results within 3 Month(s) from this visit.  Latest known visit with results is:  Nursing Home on 10/26/2015  Component Date Value Ref Range Status  . Hemoglobin 09/16/2015 15.1  13.5 - 17.5 g/dL Final  . HCT 16/11/9602 46  41 - 53 % Final  . Platelets 09/16/2015 243  150 - 399 K/L Final  . WBC 09/16/2015 6.3  10^3/mL Final  . Glucose 09/16/2015 81  mg/dL Final  . BUN 54/10/8117 19  4 - 21 mg/dL Final  . Creatinine 14/78/2956 0.7  0.6 - 1.3 mg/dL Final  . Potassium 21/30/8657 4.4  3.4 - 5.3 mmol/L Final  . Sodium 09/16/2015 143  137 - 147 mmol/L Final  . Alkaline Phosphatase 09/16/2015 60  25 - 125 U/L Final  . ALT 09/16/2015 5* 10 - 40 U/L Final  . AST 09/16/2015 9* 14 - 40 U/L Final  . Bilirubin, Total 09/16/2015 0.2  mg/dL Final    No results found.   Assessment/Plan   ICD-9-CM  ICD-10-CM   1. Failure to thrive in adult - failing to change as expected 783.7 R62.7   2. 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 PBA (pseudobulbar affect) 310.81 F48.2   4. Severe major depression, single episode, with psychotic features (HCC) 296.24 F32.3   5. Chronic pain syndrome 338.4 G89.4   6. Quadriplegic cerebral palsy (HCC) 343.2 G80.8     Cont current meds as ordered  PT/OT/ST as indicated  Psych services to follow  Cont nutritional supplements as ordered  [facility] NP to follow  Will follow  Maurice Clayton S. Ancil Linsey  [facility] [address] [place], [place] [address] [phone] Cell (Monday-Friday 8 AM - 5 PM) [phone] After 5 PM and follow prompts

## 2016-05-16 LAB — LIPID PANEL
CHOLESTEROL: 233 mg/dL — AB (ref 0–200)
HDL: 44 mg/dL (ref 35–70)
LDL Cholesterol: 153 mg/dL
TRIGLYCERIDES: 182 mg/dL — AB (ref 40–160)

## 2016-05-16 LAB — HEMOGLOBIN A1C: Hemoglobin A1C: 5.3

## 2016-06-10 ENCOUNTER — Other Ambulatory Visit: Payer: Self-pay

## 2016-06-10 MED ORDER — OXYCODONE HCL 5 MG PO TABS: 5.0000 mg | ORAL_TABLET | Freq: Three times a day (TID) | ORAL | 0 refills | Status: DC

## 2016-06-10 NOTE — Telephone Encounter (Signed)
RX faxed to [facility] fax # [phone] Phone #[phone] 

## 2016-07-05 ENCOUNTER — Non-Acute Institutional Stay (SKILLED_NURSING_FACILITY): Payer: Medicare Other | Admitting: Internal Medicine

## 2016-07-05 ENCOUNTER — Non-Acute Institutional Stay (SKILLED_NURSING_FACILITY): Payer: Medicare Other

## 2016-07-05 ENCOUNTER — Encounter: Payer: Self-pay | Admitting: Internal Medicine

## 2016-07-05 DIAGNOSIS — Z0000 Encounter for general adult medical examination without abnormal findings: Secondary | ICD-10-CM | POA: Diagnosis not present

## 2016-07-05 DIAGNOSIS — G894 Chronic pain syndrome: Secondary | ICD-10-CM | POA: Diagnosis not present

## 2016-07-05 DIAGNOSIS — F482 Pseudobulbar affect: Secondary | ICD-10-CM | POA: Diagnosis not present

## 2016-07-05 DIAGNOSIS — N2889 Other specified disorders of kidney and ureter: Secondary | ICD-10-CM

## 2016-07-05 DIAGNOSIS — G808 Other cerebral palsy: Secondary | ICD-10-CM

## 2016-07-05 DIAGNOSIS — Z79899 Other long term (current) drug therapy: Secondary | ICD-10-CM

## 2016-07-05 DIAGNOSIS — F323 Major depressive disorder, single episode, severe with psychotic features: Secondary | ICD-10-CM

## 2016-07-05 DIAGNOSIS — G300 Alzheimer's disease with early onset: Secondary | ICD-10-CM | POA: Diagnosis not present

## 2016-07-05 DIAGNOSIS — F0280 Dementia in other diseases classified elsewhere without behavioral disturbance: Secondary | ICD-10-CM | POA: Diagnosis not present

## 2016-07-05 DIAGNOSIS — R627 Adult failure to thrive: Secondary | ICD-10-CM

## 2016-07-05 NOTE — Progress Notes (Signed)
Subjective:   Maurice Clayton is a 56 y.o. male who presents for an Initial Medicare Annual Wellness Visit. Patient residing in [facility] - long term ; incapacitated and unable to answer questions appropriately     Objective:    Today's Vitals   07/05/16 1349  BP: 120/70  Pulse: 82  Temp: 97.5 F (36.4 C)  TempSrc: Oral  SpO2: 95%  Weight: 132 lb 9.6 oz (60.1 kg)  Height: 5\' 7"  (1.702 m)   Body mass index is 20.77 kg/m.  Current Medications (verified) Outpatient Encounter Prescriptions as of 07/05/2016  Medication Sig  . acetaminophen (TYLENOL) 325 MG tablet Take 650 mg by mouth 3 (three) times daily. 325 mg every 6 hours prn  . aspirin EC 81 MG tablet Take 81 mg by mouth every evening.  . cholecalciferol (VITAMIN D) 1000 units tablet Take 1,000 Units by mouth daily.  . clonazePAM (KLONOPIN) 0.5 MG tablet Take 0.5 mg by mouth at bedtime.  . clonazePAM (KLONOPIN) 1 MG tablet Take 1 mg by mouth every morning.  Marland Kitchen Dextromethorphan-Quinidine (NUEDEXTA) 20-10 MG CAPS Take 1 tablet by mouth 2 (two) times daily.  Marland Kitchen docusate sodium (COLACE) 100 MG capsule Take 1 capsule (100 mg total) by mouth 2 (two) times daily.  Marland Kitchen escitalopram (LEXAPRO) 20 MG tablet Take 20 mg by mouth daily.  . hydrocortisone (ANUSOL-HC) 25 MG suppository Place 25 mg rectally 2 (two) times daily as needed.   . memantine (NAMENDA XR) 14 MG CP24 24 hr capsule Take 14 mg by mouth daily.  . mirtazapine (REMERON) 15 MG tablet Take 15 mg by mouth every evening.   Marland Kitchen oxyCODONE (OXY IR/ROXICODONE) 5 MG immediate release tablet Take one tablet by mouth before meals and at bedtime for pain  . polyethylene glycol (MIRALAX / GLYCOLAX) packet Take 17 g by mouth daily.  . risperiDONE (RISPERDAL) 1 MG tablet Take 1 mg by mouth 2 (two) times daily.   . rivastigmine (EXELON) 1.5 MG capsule Take 1.5 mg by mouth 2 (two) times daily.   No facility-administered encounter medications on file as of 07/05/2016.      Allergies (verified) Patient has no known allergies.   History: Past Medical History:  Diagnosis Date  . Cerebral palsy (HCC)    bed bound status  . Dementia   . Depression   . HTN (hypertension)   . Hyperthyroidism   . Slow transit constipation 03/27/2015  . Vitamin B 12 deficiency    Past Surgical History:  Procedure Laterality Date  . Dental procedure    . foot surgeries     bilateral for cerebral palsy  . TOTAL HIP ARTHROPLASTY Right 02/09/2015   Procedure: TOTAL HIP ARTHROPLASTY ANTERIOR APPROACH;  Surgeon: Kennedy Bucker, MD;  Location: [facility] ORS;  Service: Orthopedics;  Laterality: Right;   Family History  Problem Relation Age of Onset  . Dementia Mother   . CAD Brother   . Aortic aneurysm Brother   . Aortic aneurysm Father   . Kidney cancer Neg Hx   . Kidney disease Neg Hx   . Prostate cancer Neg Hx    Social History   Occupational History  . Not on file.   Social History Main Topics  . Smoking status: Never Smoker  . Smokeless tobacco: Never Used  . Alcohol use No  . Drug use: No  . Sexual activity: No   Tobacco Counseling Counseling given: Not Answered   Activities of Daily Living In your present state  of health, do you have any difficulty performing the following activities: 07/05/2016  Hearing? N  Vision? N  Difficulty concentrating or making decisions? Y  Walking or climbing stairs? Y  Dressing or bathing? Y  Doing errands, shopping? Y  Preparing Food and eating ? Y  Using the Toilet? Y  In the past six months, have you accidently leaked urine? Y  Do you have problems with loss of bowel control? Y  Managing your Medications? Y  Managing your Finances? Y  Housekeeping or managing your Housekeeping? Y  Some recent data might be hidden    Immunizations and Health Maintenance Immunization History  Administered Date(s) Administered  . Influenza Split 10/24/2014  . Influenza-Unspecified 12/03/2015   Health Maintenance Due  Topic Date Due   . Hepatitis C Screening  1960/07/06  . HIV Screening  02/10/1973  . TETANUS/TDAP  02/10/1977    Patient Care Team: Kirt Boysarter, Monica, DO as PCP - [facility]Internal Medicine[facility]  Indicate any recent Medical Services you may have received from other than [facility] providers in the past year (date may be approximate).    Assessment:   This is a routine wellness examination for Maurice Clayton.  Hearing/Vision screen No exam data present  Dietary issues and exercise activities discussed: Current Exercise Habits: The patient does not participate in regular exercise at present, Exercise limited by: neurologic condition(s)  Goals    None     Depression Screen PHQ 2/9 Scores 07/05/2016  PHQ - 2 Score 0    Fall Risk Fall Risk  07/05/2016 01/04/2016  Falls in the past year? Yes Yes  Number falls in past yr: 2 or more 2 or more  Injury with Fall? No No  Risk Factor Category  - High Fall Risk  Risk for fall due to : - Impaired mobility;Impaired balance/gait;History of fall(s)  Follow up - Falls prevention discussed;Education provided    Cognitive Function: MMSE - Mini Mental State Exam 07/05/2016  Not completed: Unable to complete        Screening Tests Health Maintenance  Topic Date Due  . Hepatitis C Screening  1960/07/06  . HIV Screening  02/10/1973  . TETANUS/TDAP  02/10/1977  . INFLUENZA VACCINE  08/31/2016  . COLONOSCOPY  Excluded        Plan:    I have personally reviewed and addressed the Medicare Annual Wellness questionnaire and have noted the following in the patient's chart:  A. Medical and social history B. Use of alcohol, tobacco or illicit drugs  C. Current medications and supplements D. Functional ability and status E.  Nutritional status F.  Physical activity G. Advance directives H. List of other physicians I.  Hospitalizations, surgeries, and ER visits in previous 12 months J.  Vitals K. Screenings to include  hearing, vision, cognitive, depression L. Referrals and appointments - none  In addition, I have reviewed and discussed with patient certain preventive protocols, quality metrics, and best practice recommendations. A written personalized care plan for preventive services as well as general preventive health recommendations were provided to patient.  See attached scanned questionnaire for additional information.   Signed,   Annetta MawSara Gonthier, RN Nurse Health Advisor   Quick Notes   Health Maintenance: Pna 13 due     Abnormal Screen: cog exam unable to complete due to Cerebal palsey     Patient Concerns: None     Nurse Concerns: None

## 2016-07-05 NOTE — Progress Notes (Signed)
Patient ID: Maurice LericheBarry W Clayton, male   DOB: February 11, 1960, 56 y.o.   MRN: [id number]    DATE:  07/05/2016  Location:    [facility] Room Number: 113 B Place of Service: SNF (31)   Extended Emergency Contact Information Primary Emergency Contact: Garfield CorneaBeaudet,Jeanette  [place] Home Phone: [phone] Relation: Other Secondary Emergency Contact: Sandi MealyBeaudet,Frederick N Address: [address]          [place], [place] [address] [place] Home Phone: [phone] Relation: Other  Advanced Directive information Does Patient Have a Medical Advance Directive?: Yes, Type of Advance Directive: Out of facility DNR (pink MOST or yellow form), Pre-existing out of facility DNR order (yellow form or pink MOST form): Yellow form placed in chart (order not valid for inpatient use), Does patient want to make changes to medical advance directive?: No - Patient declined  Chief Complaint  Patient presents with  . Medical Management of Chronic Issues    Routine Visit [facility]    HPI:  56 yo male long term resident seen today for f/u. He has no concerns. Appetite improved. Sleeps well. No recent fall.  No nursing issues. He is a poor historian due to dementia. Hx obtained from chart  Left femoral head fracture (Jan 2017) with chronic pain - pain controlled on oxycodone 5 mg four time daily. He does benefit from this regimen.   Major depression with psychotic features - mod stable on lexapro 20 mg daily; remeron 15 mg nightly; risperdal  1 mg twice daily; klonopin 1 mg in the AM and 0.5 mg nightly for anxiety and akathisia; followed by psych services   Alzheimer's dementia/ cerebral palsy - takes namenda xr 14 mg daily; exelon patch 4.6 mg daily. He takes ASA 81 mg daily. Weight is 116 lb   Constipation - stable on miralax daily; colace twice daily   Left renal mass - followed by urology; last CT scan on 02-09-16 stable and will repeat in one year.   Weight loss/FTT -  current  weight is 132 lbs. 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 Cerebral palsy (HCC)    bed bound status  . Dementia   . Depression   . HTN (hypertension)   . Hyperthyroidism   . Slow transit constipation 03/27/2015  . Vitamin B 12 deficiency     Past Surgical History:  Procedure Laterality Date  . Dental procedure    . foot surgeries     bilateral for cerebral palsy  . TOTAL HIP ARTHROPLASTY Right 02/09/2015   Procedure: TOTAL HIP ARTHROPLASTY ANTERIOR APPROACH;  Surgeon: Kennedy BuckerMichael Menz, MD;  Location: [facility] ORS;  Service: Orthopedics;  Laterality: Right;    Patient Care Team: Kirt Boysarter, Aleigh Grunden, DO as PCP - General (Internal Medicine) [facility], [facility]  Social History   Social History  . Marital status: Single    Spouse name: N/A  . Number of children: N/A  . Years of education: N/A   Occupational History  . Not on file.   Social History Main Topics  . Smoking status: Never Smoker  . Smokeless tobacco: Never Used  . Alcohol use No  . Drug use: No  . Sexual activity: No   Other Topics Concern  . Not on file   Social History Narrative   [facility]. Bedbound now and minimal  verbal communication.     reports that he has never smoked. He has never used smokeless tobacco. He reports that he does not drink alcohol or use drugs.  Family History  Problem Relation Age of Onset  . Dementia Mother   . CAD Brother   . Aortic aneurysm Brother   . Aortic aneurysm Father   . Kidney cancer Neg Hx   . Kidney disease Neg Hx   . Prostate cancer Neg Hx    Family Status  Relation Status  . Mother (Not Specified)  . Brother (Not Specified)  . Brother (Not Specified)  . Father (Not Specified)  . Neg Hx (Not Specified)    Immunization History  Administered Date(s) Administered  . Influenza Split 10/24/2014  .  Influenza-Unspecified 12/03/2015    No Known Allergies  Medications: Patient's Medications  New Prescriptions   No medications on file  Previous Medications   ACETAMINOPHEN (TYLENOL) 325 MG TABLET    Take 650 mg by mouth 3 (three) times daily. 325 mg every 6 hours prn   ASPIRIN EC 81 MG TABLET    Take 81 mg by mouth every evening.   CHOLECALCIFEROL (VITAMIN D) 1000 UNITS TABLET    Take 1,000 Units by mouth daily.   CLONAZEPAM (KLONOPIN) 0.5 MG TABLET    Take 0.5 mg by mouth at bedtime.   CLONAZEPAM (KLONOPIN) 1 MG TABLET    Take 1 mg by mouth every morning.   DEXTROMETHORPHAN-QUINIDINE (NUEDEXTA) 20-10 MG CAPS    Take 1 tablet by mouth 2 (two) times daily.   DOCUSATE SODIUM (COLACE) 100 MG CAPSULE    Take 1 capsule (100 mg total) by mouth 2 (two) times daily.   ESCITALOPRAM (LEXAPRO) 20 MG TABLET    Take 20 mg by mouth daily.   HYDROCORTISONE (ANUSOL-HC) 25 MG SUPPOSITORY    Place 25 mg rectally 2 (two) times daily as needed.    MEMANTINE (NAMENDA XR) 14 MG CP24 24 HR CAPSULE    Take 14 mg by mouth daily.   MIRTAZAPINE (REMERON) 15 MG TABLET    Take 15 mg by mouth every evening.    OXYCODONE (OXY IR/ROXICODONE) 5 MG IMMEDIATE RELEASE TABLET    Take one tablet by mouth before meals and at bedtime for pain   POLYETHYLENE GLYCOL (MIRALAX / GLYCOLAX) PACKET    Take 17 g by mouth daily.   RISPERIDONE (RISPERDAL) 1 MG TABLET    Take 1 mg by mouth 2 (two) times daily.    RIVASTIGMINE (EXELON) 1.5 MG CAPSULE    Take 1.5 mg by mouth 2 (two) times daily.  Modified Medications   No medications on file  Discontinued Medications   OXYCODONE (OXY IR/ROXICODONE) 5 MG IMMEDIATE RELEASE TABLET    Take 1 tablet (5 mg total) by mouth 4 (four) times daily -  before meals and at bedtime.   RIVASTIGMINE (EXELON) 4.6 MG/24HR    Place 4.6 mg onto the skin daily. Apply 1 patch daily for dementia    Review of Systems  Unable to perform ROS: Dementia    Vitals:   07/05/16 1308  BP: 129/75  Pulse: 82    Resp: 20  Temp: 98.4 F (36.9 C)  TempSrc: Oral  SpO2: 97%  Weight: 132 lb 9.6 oz (60.1 kg)  Height: 5\' 7"  (1.702 m)   Body mass index is 20.77 kg/m.  Physical Exam  Constitutional:  Sitting in w/c in NAD, frail appearing  HENT:  Mouth/Throat: Oropharynx is clear and moist.  Eyes: Pupils are equal, round, and reactive to light. No scleral icterus.  Neck: Neck supple. Carotid bruit is not present. No thyromegaly present.  Cardiovascular: Normal rate, regular rhythm and intact distal pulses.  Exam reveals no gallop and no friction rub.   Murmur (1/6 SEM) heard. No LE edema b/l. No calf TTP  Pulmonary/Chest: Effort normal and breath sounds normal. He has no wheezes. He has no rales. He exhibits no tenderness.  Abdominal: Soft. Bowel sounds are normal. He exhibits no distension, no abdominal bruit, no pulsatile midline mass and no mass. There is no tenderness. There is no rebound and no guarding.  Musculoskeletal: He exhibits edema. He exhibits no tenderness.  Contractures of extremities present.   Lymphadenopathy:    He has no cervical adenopathy.  Neurological: He is alert.  Skin: Skin is warm and dry. No rash noted.  Psychiatric: He has a normal mood and affect. His behavior is normal.     Labs reviewed: Nursing Home on 07/05/2016  Component Date Value Ref Range Status  . Triglycerides 05/16/2016 182* 40 - 160 mg/dL Final  . Cholesterol 16/11/9602 233* 0 - 200 mg/dL Final  . HDL 54/10/8117 44  35 - 70 mg/dL Final  . LDL Cholesterol 05/16/2016 153  mg/dL Final  . Hemoglobin J4N 05/16/2016 5.3   Final    No results found.   Assessment/Plan   ICD-10-CM   1. 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 Failure to thrive in adult R62.7   3. Dementia in Alzheimer's disease with early onset G30.0    F02.80   4. PBA (pseudobulbar affect) F48.2   5. Severe major depression, single episode, with psychotic features (HCC) F32.3   6. Chronic pain syndrome G89.4    with hx  left femoral head fx  7. Left renal mass N28.89   8. High risk medication use Z79.899     Check CMP and CBC  Cont current meds as ordered  Psych services to follow  Cont nutritional supplements as indicated  PT/OT/ST as indicated  [facility] NP to follow  Will follow  Love Chowning S. Ancil Linsey  [facility] [address] [place], [place] [address] [phone] Cell (Monday-Friday 8 AM - 5 PM) [phone] After 5 PM and follow prompts

## 2016-07-07 NOTE — Patient Instructions (Signed)
Mr. Maurice Clayton , Thank you for taking time to come for your Medicare Wellness Visit. I appreciate your ongoing commitment to your health goals. Please review the following plan we discussed and let me know if I can assist you in the future.   Screening recommendations/referrals: Colonoscopy long term pt Recommended yearly ophthalmology/optometry visit for glaucoma screening and checkup Recommended yearly dental visit for hygiene and checkup  Vaccinations: Influenza vaccine due 12/02/16 Pneumococcal vaccine 13 due Tdap vaccine not in records Shingles vaccine not in records  Advanced directives: DNR in chart  Conditions/risks identified: none  Next appointment: none upcoming  Preventive Care 40-64 Years, Male Preventive care refers to lifestyle choices and visits with your health care provider that can promote health and wellness. What does preventive care include?  A yearly physical exam. This is also called an annual well check.  Dental exams once or twice a year.  Routine eye exams. Ask your health care provider how often you should have your eyes checked.  Personal lifestyle choices, including:  Daily care of your teeth and gums.  Regular physical activity.  Eating a healthy diet.  Avoiding tobacco and drug use.  Limiting alcohol use.  Practicing safe sex.  Taking low-dose aspirin every day starting at age 58. What happens during an annual well check? The services and screenings done by your health care provider during your annual well check will depend on your age, overall health, lifestyle risk factors, and family history of disease. Counseling  Your health care provider may ask you questions about your:  Alcohol use.  Tobacco use.  Drug use.  Emotional well-being.  Home and relationship well-being.  Sexual activity.  Eating habits.  Work and work Astronomerenvironment. Screening  You may have the following tests or measurements:  Height, weight, and  BMI.  Blood pressure.  Lipid and cholesterol levels. These may be checked every 5 years, or more frequently if you are over 58 years old.  Skin check.  Lung cancer screening. You may have this screening every year starting at age 58 if you have a 30-pack-year history of smoking and currently smoke or have quit within the past 15 years.  Fecal occult blood test (FOBT) of the stool. You may have this test every year starting at age 58.  Flexible sigmoidoscopy or colonoscopy. You may have a sigmoidoscopy every 5 years or a colonoscopy every 10 years starting at age 58.  Prostate cancer screening. Recommendations will vary depending on your family history and other risks.  Hepatitis C blood test.  Hepatitis B blood test.  Sexually transmitted disease (STD) testing.  Diabetes screening. This is done by checking your blood sugar (glucose) after you have not eaten for a while (fasting). You may have this done every 1-3 years. Discuss your test results, treatment options, and if necessary, the need for more tests with your health care provider. Vaccines  Your health care provider may recommend certain vaccines, such as:  Influenza vaccine. This is recommended every year.  Tetanus, diphtheria, and acellular pertussis (Tdap, Td) vaccine. You may need a Td booster every 10 years.  Zoster vaccine. You may need this after age 58.  Pneumococcal 13-valent conjugate (PCV13) vaccine. You may need this if you have certain conditions and have not been vaccinated.  Pneumococcal polysaccharide (PPSV23) vaccine. You may need one or two doses if you smoke cigarettes or if you have certain conditions. Talk to your health care provider about which screenings and vaccines you need and how often  you need them. This information is not intended to replace advice given to you by your health care provider. Make sure you discuss any questions you have with your health care provider. Document Released:  02/13/2015 Document Revised: 10/07/2015 Document Reviewed: 11/18/2014 Elsevier Interactive Patient Education  2017 [place] Prevention in the Home Falls can cause injuries. They can happen to people of all ages. There are many things you can do to make your home safe and to help prevent falls. What can I do on the outside of my home?  Regularly fix the edges of walkways and driveways and fix any cracks.  Remove anything that might make you trip as you walk through a door, such as a raised step or threshold.  Trim any bushes or trees on the path to your home.  Use bright outdoor lighting.  Clear any walking paths of anything that might make someone trip, such as rocks or tools.  Regularly check to see if handrails are loose or broken. Make sure that both sides of any steps have handrails.  Any raised decks and porches should have guardrails on the edges.  Have any leaves, snow, or ice cleared regularly.  Use sand or salt on walking paths during winter.  Clean up any spills in your garage right away. This includes oil or grease spills. What can I do in the bathroom?  Use night lights.  Install grab bars by the toilet and in the tub and shower. Do not use towel bars as grab bars.  Use non-skid mats or decals in the tub or shower.  If you need to sit down in the shower, use a plastic, non-slip stool.  Keep the floor dry. Clean up any water that spills on the floor as soon as it happens.  Remove soap buildup in the tub or shower regularly.  Attach bath mats securely with double-sided non-slip rug tape.  Do not have throw rugs and other things on the floor that can make you trip. What can I do in the bedroom?  Use night lights.  Make sure that you have a light by your bed that is easy to reach.  Do not use any sheets or blankets that are too big for your bed. They should not hang down onto the floor.  Have a firm chair that has side arms. You can use this for  support while you get dressed.  Do not have throw rugs and other things on the floor that can make you trip. What can I do in the kitchen?  Clean up any spills right away.  Avoid walking on wet floors.  Keep items that you use a lot in easy-to-reach places.  If you need to reach something above you, use a strong step stool that has a grab bar.  Keep electrical cords out of the way.  Do not use floor polish or wax that makes floors slippery. If you must use wax, use non-skid floor wax.  Do not have throw rugs and other things on the floor that can make you trip. What can I do with my stairs?  Do not leave any items on the stairs.  Make sure that there are handrails on both sides of the stairs and use them. Fix handrails that are broken or loose. Make sure that handrails are as long as the stairways.  Check any carpeting to make sure that it is firmly attached to the stairs. Fix any carpet that is loose or worn.  Avoid having throw rugs at the top or bottom of the stairs. If you do have throw rugs, attach them to the floor with carpet tape.  Make sure that you have a light switch at the top of the stairs and the bottom of the stairs. If you do not have them, ask someone to add them for you. What else can I do to help prevent falls?  Wear shoes that:  Do not have high heels.  Have rubber bottoms.  Are comfortable and fit you well.  Are closed at the toe. Do not wear sandals.  If you use a stepladder:  Make sure that it is fully opened. Do not climb a closed stepladder.  Make sure that both sides of the stepladder are locked into place.  Ask someone to hold it for you, if possible.  Clearly mark and make sure that you can see:  Any grab bars or handrails.  First and last steps.  Where the edge of each step is.  Use tools that help you move around (mobility aids) if they are needed. These include:  Canes.  Walkers.  Scooters.  Crutches.  Turn on the  lights when you go into a dark area. Replace any light bulbs as soon as they burn out.  Set up your furniture so you have a clear path. Avoid moving your furniture around.  If any of your floors are uneven, fix them.  If there are any pets around you, be aware of where they are.  Review your medicines with your doctor. Some medicines can make you feel dizzy. This can increase your chance of falling. Ask your doctor what other things that you can do to help prevent falls. This information is not intended to replace advice given to you by your health care provider. Make sure you discuss any questions you have with your health care provider. Document Released: 11/13/2008 Document Revised: 06/25/2015 Document Reviewed: 02/21/2014 Elsevier Interactive Patient Education  2017 [employer].

## 2016-07-11 ENCOUNTER — Other Ambulatory Visit: Payer: Self-pay | Admitting: *Deleted

## 2016-07-11 MED ORDER — OXYCODONE HCL 5 MG PO TABS: ORAL_TABLET | ORAL | 0 refills | Status: AC

## 2016-07-11 NOTE — Telephone Encounter (Signed)
[facility] #: [phone] Fax#: [phone]  

## 2016-09-28 ENCOUNTER — Other Ambulatory Visit (HOSPITAL_COMMUNITY): Payer: Self-pay | Admitting: Family Medicine

## 2016-09-28 DIAGNOSIS — N2889 Other specified disorders of kidney and ureter: Secondary | ICD-10-CM

## 2016-10-06 ENCOUNTER — Ambulatory Visit (HOSPITAL_COMMUNITY)
Admission: RE | Admit: 2016-10-06 | Discharge: 2016-10-06 | Disposition: A | Discharge status: Home / Self Care | Payer: Medicare Other | Source: Ambulatory Visit | Attending: Family Medicine | Admitting: Family Medicine

## 2016-10-06 DIAGNOSIS — N2889 Other specified disorders of kidney and ureter: Secondary | ICD-10-CM | POA: Diagnosis not present

## 2016-10-06 MED ORDER — IOPAMIDOL (ISOVUE-300) INJECTION 61%
INTRAVENOUS | Status: AC
  Administered 2016-10-06: 100 mL
  Filled 2016-10-06: qty 100

## 2016-11-13 IMAGING — US US RENAL
1 series · 13 of 25 positions shown · non-contrast
Comparison: CT dated 02/06/2015.

CLINICAL DATA: Left renal mass on CT.

Subtle 12 mm mass appreciated along the posterior cortical margin of
the lower left kidney on recent CT.
EXAM:
RENAL / URINARY TRACT ULTRASOUND COMPLETE

[Series 1: us renal · 0.19mm/px · 31 acquisitions, 13 frames shown]
[im 1/31]
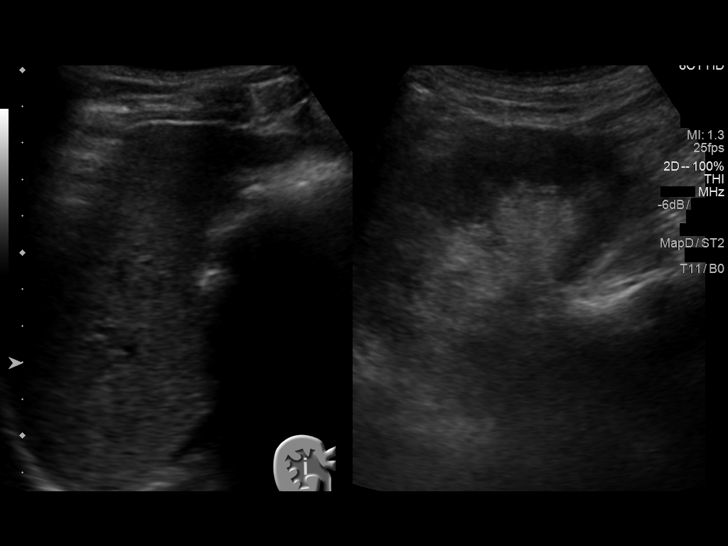
[im 3/31]
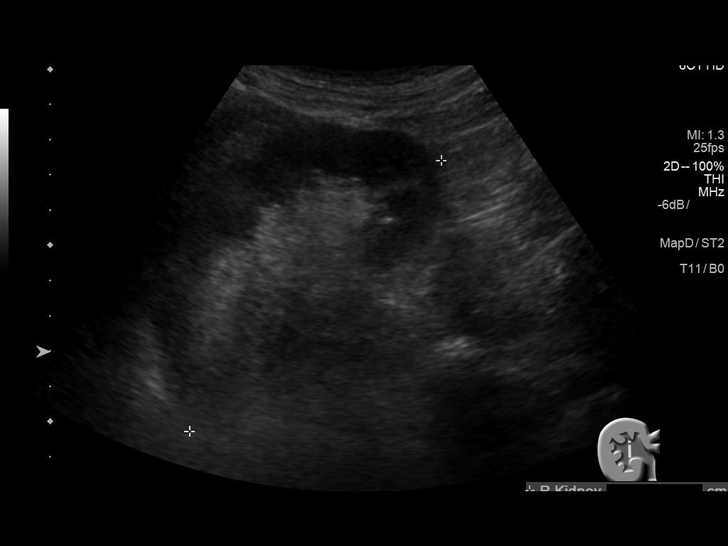
[im 6/31]
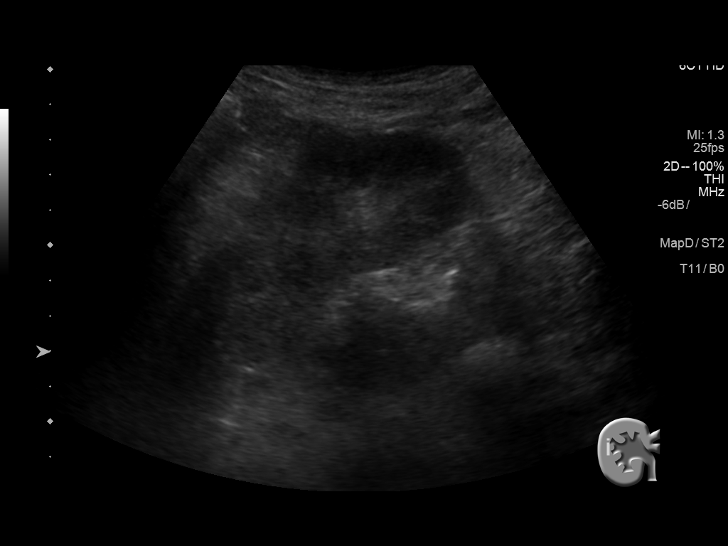
[im 8/31]
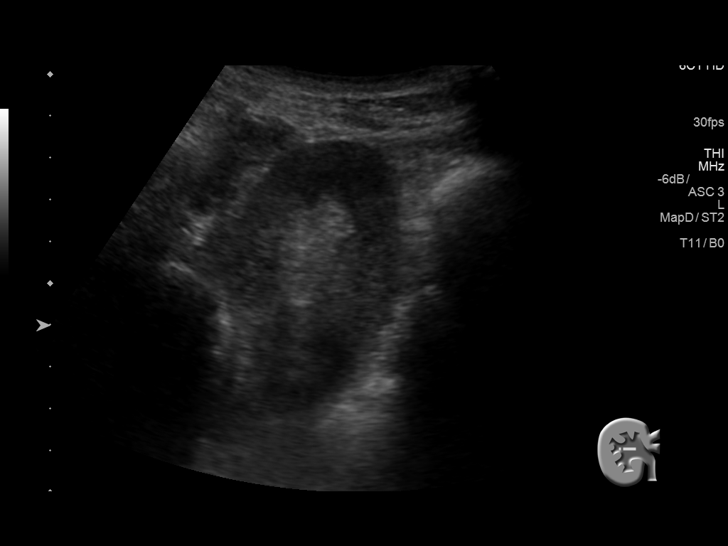
[im 11/31]
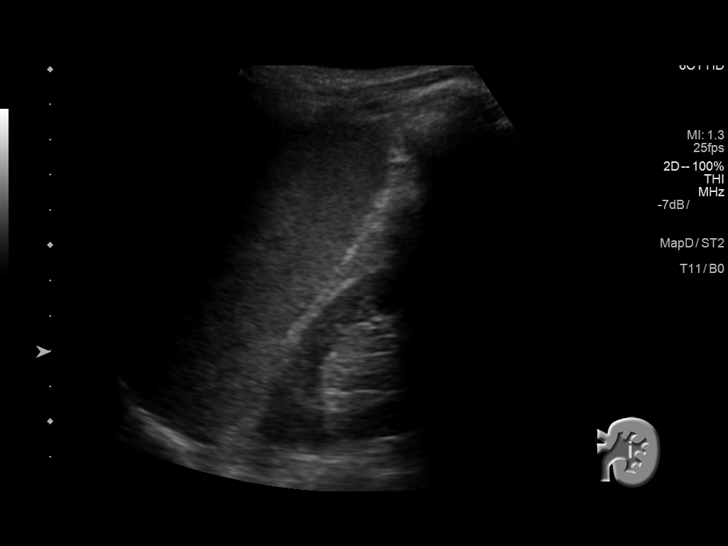
[im 13/31]
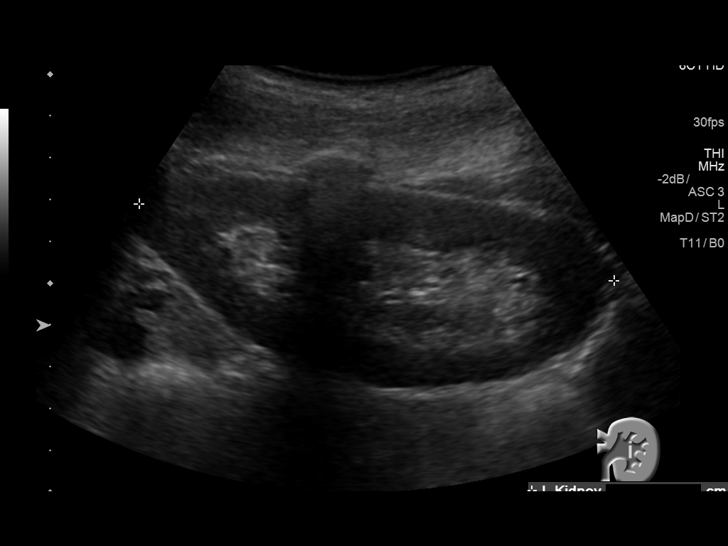
[im 16/31]
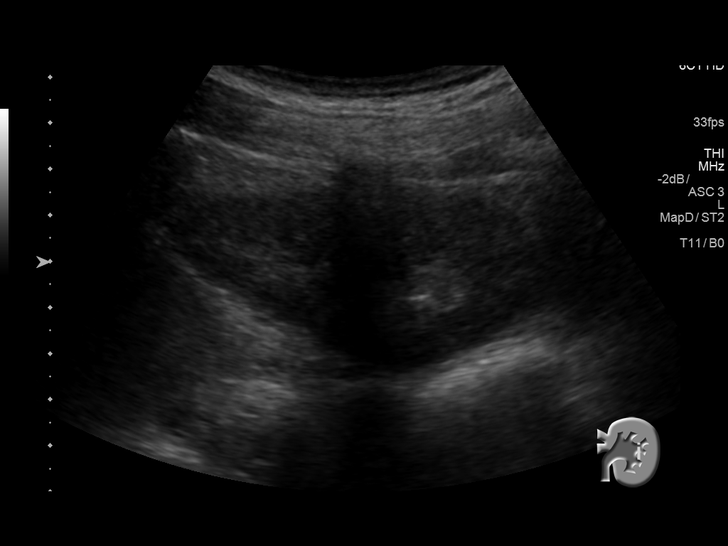
[im 18/31]
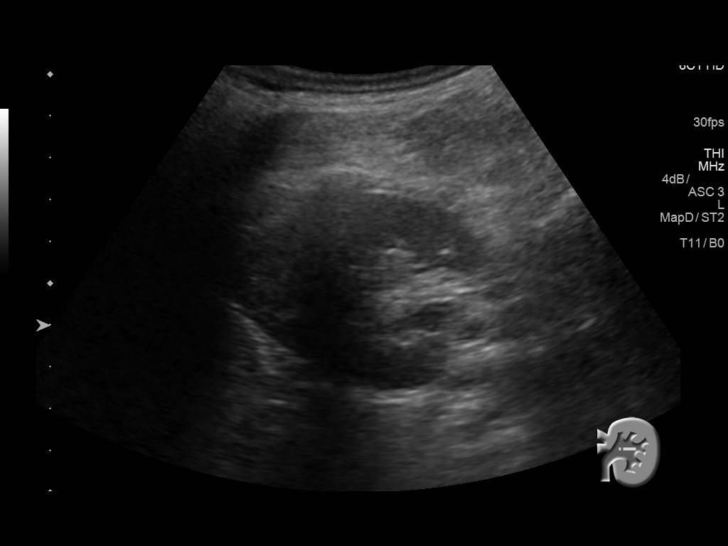
[im 21/31]
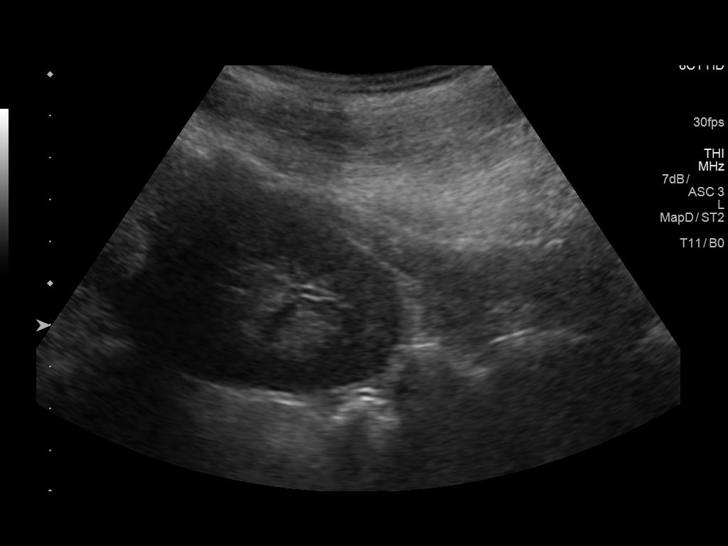
[im 23/31]
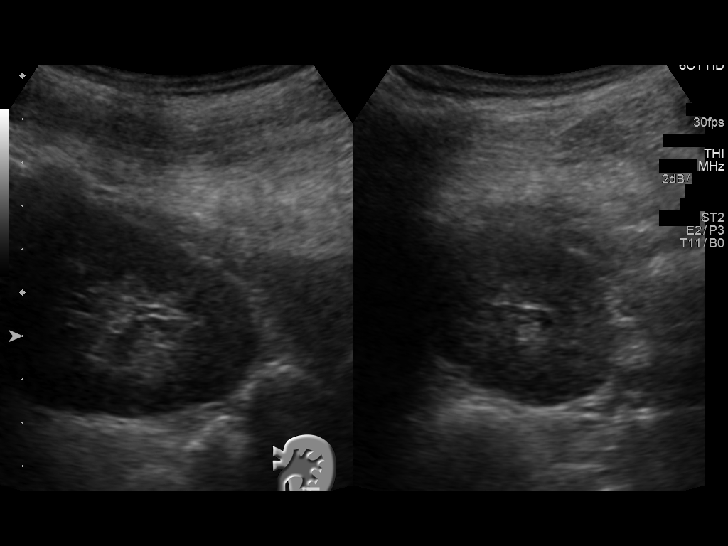
[im 26/31]
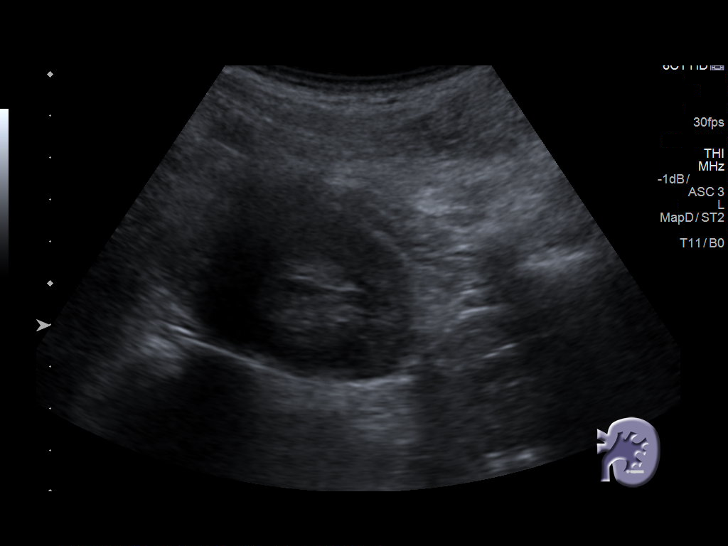
[im 28/31]
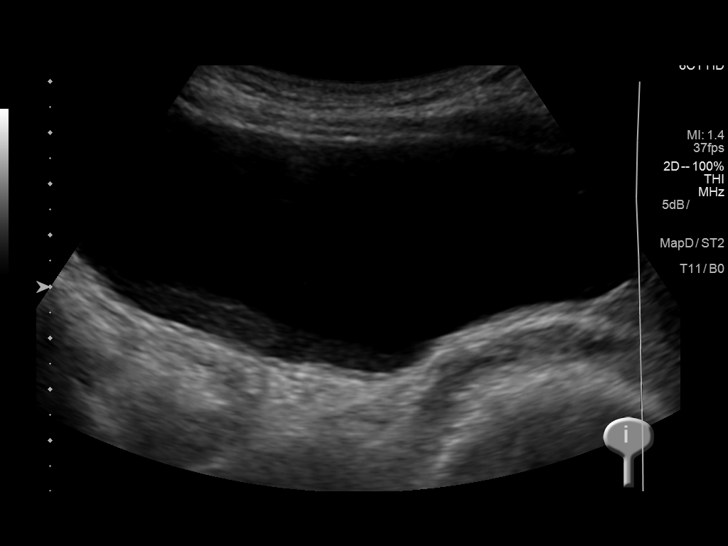
[im 31/31]
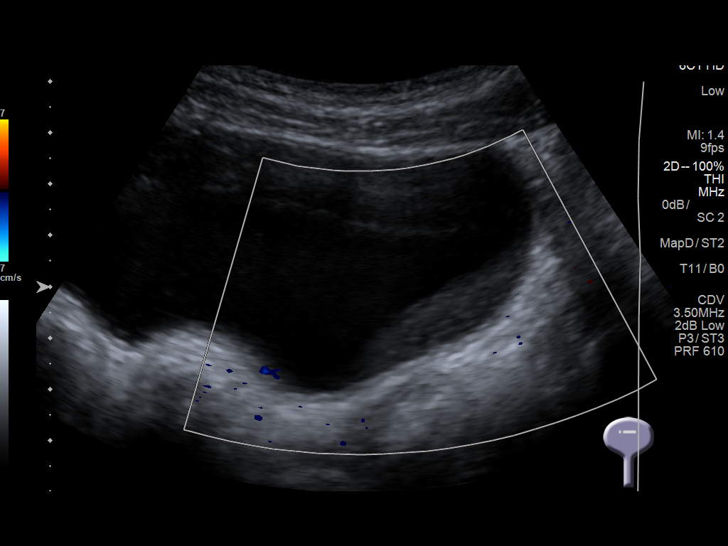

[13 of 25 positions shown; findings below may reference images not displayed]

FINDINGS: Right Kidney:

Length: 10.5 cm. Echogenicity within normal limits. No mass, stone
or hydronephrosis visualized.

Left Kidney:

Length: 11.5 cm. Subtle isoechoic-to-hyperechoic mass appreciated
within the lower pole of the left kidney, measuring 2.3 x 2 cm, a
possible correlate for the recent CT finding. Left kidney otherwise
unremarkable. No renal stone or hydronephrosis.

Bladder:

Avascular material is present along the dependent/posterior wall of
the bladder most suggestive of debris. Bladder otherwise
unremarkable.
IMPRESSION: 1. Subtle isoechoic-to-hyperechoic mass within the lower pole of the
LEFT kidney, measuring 2.3 x 2 cm, not adequately visualized for
definitive characterization, a possible correlate for the 1.2 cm
mass seen on recent CT. Recommend more definitive characterization
with either renal protocol CT (with and without contrast) or renal
protocol MRI.
2. Right kidney appears normal.
3. Probable debris within the dependent aspects of the bladder.
Bladder otherwise unremarkable.

## 2017-06-01 ENCOUNTER — Non-Acute Institutional Stay: Payer: Self-pay | Admitting: Hospice and Palliative Medicine

## 2017-06-01 DIAGNOSIS — Z515 Encounter for palliative care: Secondary | ICD-10-CM

## 2017-06-01 NOTE — Progress Notes (Signed)
    PALLIATIVE CARE CONSULT VISIT   PATIENT NAME: Maurice Clayton DOB: [birth year] MRN: [id number]  PRIMARY CARE PROVIDER: Default, Provider, MD  RESPONSIBLE PARTY:   Trecia Rogers [phone])   RECOMMENDATIONS and PLAN:  1. Weakness - no significant change reported by staff. Patient continues to try to climb out of bed and is at risk of self injury. His moods and agitation seem better controlled. Oral intake seems adaquate.  2. GOC - DNR. Consider hospice in the setting of decline.   I spent 10 minutes providing this consultation,  from 0830 to 0840. More than 50% of the time in this consultation was spent coordinating communication.   HISTORY OF PRESENT ILLNESS:  Maurice Clayton is a 57 yo man with multiple medical problems including cerebral palsy and dementia who was admitted to the hospital in 02/2015 for a R. hip fracture requiring operative repair. Patient has since been at [facility] for LTC. Today's visit is for routine follow up.   CODE STATUS: DNR  PPS: 30% HOSPICE ELIGIBILITY/DIAGNOSIS: TBD  PAST MEDICAL HISTORY:  Past Medical History:  Diagnosis Date  . Cerebral palsy (HCC)    bed bound status  . Dementia   . Depression   . HTN (hypertension)   . Hyperthyroidism   . Slow transit constipation 03/27/2015  . Vitamin B 12 deficiency     SOCIAL HX:  Social History   Tobacco Use  . Smoking status: Never Smoker  . Smokeless tobacco: Never Used  Substance Use Topics  . Alcohol use: No    ALLERGIES: No Known Allergies   PERTINENT MEDICATIONS:  Outpatient Encounter Medications as of 06/01/2017  Medication Sig  . acetaminophen (TYLENOL) 325 MG tablet Take 650 mg by mouth 3 (three) times daily. 325 mg every 6 hours prn  . aspirin EC 81 MG tablet Take 81 mg by mouth every evening.  . cholecalciferol (VITAMIN D) 1000 units tablet Take 1,000 Units by mouth daily.  . clonazePAM (KLONOPIN) 0.5 MG tablet Take 0.5 mg by mouth at bedtime.  . clonazePAM (KLONOPIN) 1 MG tablet Take 1  mg by mouth every morning.  Marland Kitchen Dextromethorphan-Quinidine (NUEDEXTA) 20-10 MG CAPS Take 1 tablet by mouth 2 (two) times daily.  Marland Kitchen docusate sodium (COLACE) 100 MG capsule Take 1 capsule (100 mg total) by mouth 2 (two) times daily.  Marland Kitchen escitalopram (LEXAPRO) 20 MG tablet Take 20 mg by mouth daily.  . hydrocortisone (ANUSOL-HC) 25 MG suppository Place 25 mg rectally 2 (two) times daily as needed.   . memantine (NAMENDA XR) 14 MG CP24 24 hr capsule Take 14 mg by mouth daily.  . mirtazapine (REMERON) 15 MG tablet Take 15 mg by mouth every evening.   Marland Kitchen oxyCODONE (OXY IR/ROXICODONE) 5 MG immediate release tablet Take one tablet by mouth before meals and at bedtime for pain  . polyethylene glycol (MIRALAX / GLYCOLAX) packet Take 17 g by mouth daily.  . risperiDONE (RISPERDAL) 1 MG tablet Take 1 mg by mouth 2 (two) times daily.   . rivastigmine (EXELON) 1.5 MG capsule Take 1.5 mg by mouth 2 (two) times daily.   No facility-administered encounter medications on file as of 06/01/2017.     PHYSICAL EXAM:   General: NAD, frail appearing, thin, lying in bed Pulmonary: unlabored Extremities: no edema, no joint deformities Skin: no rashes Neurological: Weakness, sleeping but wakes when stimulated  Malachy Moan, NP

## 2017-07-17 ENCOUNTER — Non-Acute Institutional Stay: Payer: Self-pay | Admitting: Hospice and Palliative Medicine

## 2017-07-17 DIAGNOSIS — Z515 Encounter for palliative care: Secondary | ICD-10-CM

## 2017-07-17 NOTE — Progress Notes (Signed)
    PALLIATIVE CARE CONSULT VISIT   PATIENT NAME: Maurice Clayton DOB: March 12, 1960 MRN: [id number]  PRIMARY CARE PROVIDER: Default, Provider, MD  RESPONSIBLE PARTY:   Trecia RogersJeanette Beudet [phone])  ASSESSMENT: Chart reviewed and case discussed with nursing staff. Patient appears clinically stable. Oral intake adequate. No recent medication changes, infections, or hospitalizations.    RECOMMENDATIONS and PLAN:  Will follow  I spent 10 minutes providing this consultation,  from 1030 to 1040. More than 50% of the time in this consultation was spent coordinating communication.   HISTORY OF PRESENT ILLNESS:  Maurice Clayton is a 57 yo man with multiple medical problems including cerebral palsy and dementia who was admitted to the hospital in 02/2015 for a R. hip fracture requiring operative repair. Patient has since been at [facility] for LTC. Today's visit is for routine follow up.   CODE STATUS: DNR  PPS: 30% HOSPICE ELIGIBILITY/DIAGNOSIS: TBD  PAST MEDICAL HISTORY:  Past Medical History:  Diagnosis Date  . Cerebral palsy (HCC)    bed bound status  . Dementia   . Depression   . HTN (hypertension)   . Hyperthyroidism   . Slow transit constipation 03/27/2015  . Vitamin B 12 deficiency     SOCIAL HX:  Social History   Tobacco Use  . Smoking status: Never Smoker  . Smokeless tobacco: Never Used  Substance Use Topics  . Alcohol use: No    ALLERGIES: No Known Allergies   PERTINENT MEDICATIONS:  Outpatient Encounter Medications as of 07/17/2017  Medication Sig  . acetaminophen (TYLENOL) 325 MG tablet Take 650 mg by mouth 3 (three) times daily. 325 mg every 6 hours prn  . aspirin EC 81 MG tablet Take 81 mg by mouth every evening.  . cholecalciferol (VITAMIN D) 1000 units tablet Take 1,000 Units by mouth daily.  . clonazePAM (KLONOPIN) 0.5 MG tablet Take 0.5 mg by mouth at bedtime.  . clonazePAM (KLONOPIN) 1 MG tablet Take 1 mg by mouth every morning.  Marland Kitchen. Dextromethorphan-Quinidine  (NUEDEXTA) 20-10 MG CAPS Take 1 tablet by mouth 2 (two) times daily.  Marland Kitchen. docusate sodium (COLACE) 100 MG capsule Take 1 capsule (100 mg total) by mouth 2 (two) times daily.  Marland Kitchen. escitalopram (LEXAPRO) 20 MG tablet Take 20 mg by mouth daily.  . hydrocortisone (ANUSOL-HC) 25 MG suppository Place 25 mg rectally 2 (two) times daily as needed.   . memantine (NAMENDA XR) 14 MG CP24 24 hr capsule Take 14 mg by mouth daily.  . mirtazapine (REMERON) 15 MG tablet Take 15 mg by mouth every evening.   Marland Kitchen. oxyCODONE (OXY IR/ROXICODONE) 5 MG immediate release tablet Take one tablet by mouth before meals and at bedtime for pain  . polyethylene glycol (MIRALAX / GLYCOLAX) packet Take 17 g by mouth daily.  . risperiDONE (RISPERDAL) 1 MG tablet Take 1 mg by mouth 2 (two) times daily.   . rivastigmine (EXELON) 1.5 MG capsule Take 1.5 mg by mouth 2 (two) times daily.   No facility-administered encounter medications on file as of 07/17/2017.     PHYSICAL EXAM:   General: NAD, frail appearing, thin, in wheelchair Pulmonary: unlabored Extremities: no edema, no joint deformities Skin: no rashes Neurological: Weakness, alert, says a few words  Malachy MoanJOSHUA R BORDERS, NP

## 2017-09-05 ENCOUNTER — Non-Acute Institutional Stay: Payer: Self-pay | Admitting: Hospice and Palliative Medicine

## 2017-09-05 DIAGNOSIS — Z515 Encounter for palliative care: Secondary | ICD-10-CM

## 2017-09-05 DIAGNOSIS — R531 Weakness: Secondary | ICD-10-CM

## 2017-09-05 NOTE — Progress Notes (Signed)
    PALLIATIVE CARE CONSULT VISIT   PATIENT NAME: Maurice Clayton DOB: May 28, 1960 MRN: [id number]  PRIMARY CARE PROVIDER: Default, Provider, MD  RESPONSIBLE PARTY:   Maurice Clayton [phone])  ASSESSMENT: No changes or decline noted since patient was last seen. Nursing had no concerns today. Oral intake appears adequate and no weight loss reported. Patient continues to have periods of agitation but moods have been better recently     RECOMMENDATIONS and PLAN:  Will follow  I spent 10 minutes providing this consultation,  from 1000 to 1010. More than 50% of the time in this consultation was spent coordinating communication.   HISTORY OF PRESENT ILLNESS:  Mr. Maurice Clayton is a 57 yo man with multiple medical problems including cerebral palsy and dementia who was admitted to the hospital in 02/2015 for a R. hip fracture requiring operative repair. Patient has since been at [facility] for LTC. Today's visit is for routine follow up.   CODE STATUS: DNR  PPS: 30% HOSPICE ELIGIBILITY/DIAGNOSIS: TBD  PAST MEDICAL HISTORY:  Past Medical History:  Diagnosis Date  . Cerebral palsy (HCC)    bed bound status  . Dementia   . Depression   . HTN (hypertension)   . Hyperthyroidism   . Slow transit constipation 03/27/2015  . Vitamin B 12 deficiency     SOCIAL HX:  Social History   Tobacco Use  . Smoking status: Never Smoker  . Smokeless tobacco: Never Used  Substance Use Topics  . Alcohol use: No    ALLERGIES: No Known Allergies   PERTINENT MEDICATIONS:  Outpatient Encounter Medications as of 09/05/2017  Medication Sig  . acetaminophen (TYLENOL) 325 MG tablet Take 650 mg by mouth 3 (three) times daily. 325 mg every 6 hours prn  . aspirin EC 81 MG tablet Take 81 mg by mouth every evening.  . cholecalciferol (VITAMIN D) 1000 units tablet Take 1,000 Units by mouth daily.  . clonazePAM (KLONOPIN) 0.5 MG tablet Take 0.5 mg by mouth at bedtime.  . clonazePAM (KLONOPIN) 1 MG tablet Take 1 mg by mouth  every morning.  Marland Kitchen. Dextromethorphan-Quinidine (NUEDEXTA) 20-10 MG CAPS Take 1 tablet by mouth 2 (two) times daily.  Marland Kitchen. docusate sodium (COLACE) 100 MG capsule Take 1 capsule (100 mg total) by mouth 2 (two) times daily.  Marland Kitchen. escitalopram (LEXAPRO) 20 MG tablet Take 20 mg by mouth daily.  . hydrocortisone (ANUSOL-HC) 25 MG suppository Place 25 mg rectally 2 (two) times daily as needed.   . memantine (NAMENDA XR) 14 MG CP24 24 hr capsule Take 14 mg by mouth daily.  . mirtazapine (REMERON) 15 MG tablet Take 15 mg by mouth every evening.   Marland Kitchen. oxyCODONE (OXY IR/ROXICODONE) 5 MG immediate release tablet Take one tablet by mouth before meals and at bedtime for pain  . polyethylene glycol (MIRALAX / GLYCOLAX) packet Take 17 g by mouth daily.  . risperiDONE (RISPERDAL) 1 MG tablet Take 1 mg by mouth 2 (two) times daily.   . rivastigmine (EXELON) 1.5 MG capsule Take 1.5 mg by mouth 2 (two) times daily.   No facility-administered encounter medications on file as of 09/05/2017.     PHYSICAL EXAM:   General: NAD, frail appearing, thin, in bed Pulmonary: unlabored Extremities: no edema, no joint deformities Skin: no rashes Neurological: Weakness, alert, says a few words  Malachy MoanJOSHUA R Brittne Kawasaki, NP

## 2017-10-12 ENCOUNTER — Non-Acute Institutional Stay: Payer: Self-pay | Admitting: Hospice and Palliative Medicine

## 2017-10-12 DIAGNOSIS — Z515 Encounter for palliative care: Secondary | ICD-10-CM

## 2017-10-12 NOTE — Progress Notes (Signed)
    PALLIATIVE CARE CONSULT VISIT   PATIENT NAME: Maurice Clayton DOB: August 09, 1960 MRN: [id number]  PRIMARY CARE PROVIDER: Default, Provider, MD  RESPONSIBLE PARTY:   Maurice Clayton [phone])  ASSESSMENT: Patient comfortable appearing. He is confused at baseline and unable to engage meaningfully in conversations about goals. Patient appears to be medically stable since last visit. No changes or concerns reported by nursing staff. Oral intake is reportedly adequate and patient is without weight loss.    RECOMMENDATIONS and PLAN:  Will follow  I spent 10 minutes providing this consultation,  from 1310 to 1320. More than 50% of the time in this consultation was spent coordinating communication.   HISTORY OF PRESENT ILLNESS:  Mr. Maurice Clayton is a 57 yo man with multiple medical problems including cerebral palsy and dementia who was admitted to the hospital in 02/2015 for a R. hip fracture requiring operative repair. Patient has since been at [facility] for LTC. Today's visit is for routine follow up.   CODE STATUS: DNR  PPS: 30% HOSPICE ELIGIBILITY/DIAGNOSIS: TBD  PAST MEDICAL HISTORY:  Past Medical History:  Diagnosis Date  . Cerebral palsy (HCC)    bed bound status  . Dementia   . Depression   . HTN (hypertension)   . Hyperthyroidism   . Slow transit constipation 03/27/2015  . Vitamin B 12 deficiency     SOCIAL HX:  Social History   Tobacco Use  . Smoking status: Never Smoker  . Smokeless tobacco: Never Used  Substance Use Topics  . Alcohol use: No    ALLERGIES: No Known Allergies   PERTINENT MEDICATIONS:  Outpatient Encounter Medications as of 10/12/2017  Medication Sig  . acetaminophen (TYLENOL) 325 MG tablet Take 650 mg by mouth 3 (three) times daily. 325 mg every 6 hours prn  . aspirin EC 81 MG tablet Take 81 mg by mouth every evening.  . cholecalciferol (VITAMIN D) 1000 units tablet Take 1,000 Units by mouth daily.  . clonazePAM (KLONOPIN) 0.5 MG tablet Take 0.5 mg by  mouth at bedtime.  . clonazePAM (KLONOPIN) 1 MG tablet Take 1 mg by mouth every morning.  Marland Kitchen. Dextromethorphan-Quinidine (NUEDEXTA) 20-10 MG CAPS Take 1 tablet by mouth 2 (two) times daily.  Marland Kitchen. docusate sodium (COLACE) 100 MG capsule Take 1 capsule (100 mg total) by mouth 2 (two) times daily.  Marland Kitchen. escitalopram (LEXAPRO) 20 MG tablet Take 20 mg by mouth daily.  . hydrocortisone (ANUSOL-HC) 25 MG suppository Place 25 mg rectally 2 (two) times daily as needed.   . memantine (NAMENDA XR) 14 MG CP24 24 hr capsule Take 14 mg by mouth daily.  . mirtazapine (REMERON) 15 MG tablet Take 15 mg by mouth every evening.   Marland Kitchen. oxyCODONE (OXY IR/ROXICODONE) 5 MG immediate release tablet Take one tablet by mouth before meals and at bedtime for pain  . polyethylene glycol (MIRALAX / GLYCOLAX) packet Take 17 g by mouth daily.  . risperiDONE (RISPERDAL) 1 MG tablet Take 1 mg by mouth 2 (two) times daily.   . rivastigmine (EXELON) 1.5 MG capsule Take 1.5 mg by mouth 2 (two) times daily.   No facility-administered encounter medications on file as of 10/12/2017.     PHYSICAL EXAM:   General: NAD, frail appearing, thin, in bed Pulmonary: unlabored Extremities: no edema, no joint deformities Skin: no rashes Neurological: Weakness, alert, says a few words  Malachy MoanJOSHUA R Lannah Koike, NP

## 2018-01-04 IMAGING — CR DG HIP (WITH OR WITHOUT PELVIS) 2-3V*R*
1 series · 2 of 2 positions shown · non-contrast
Comparison: No priors.

CLINICAL DATA: 54-year-old male status post right hip arthroplasty.

EXAM:
DG HIP (WITH OR WITHOUT PELVIS) 2-3V RIGHT

[Series 1: ap · 0.17mm/px · 2 of 2 slices shown]
[im 1/2]
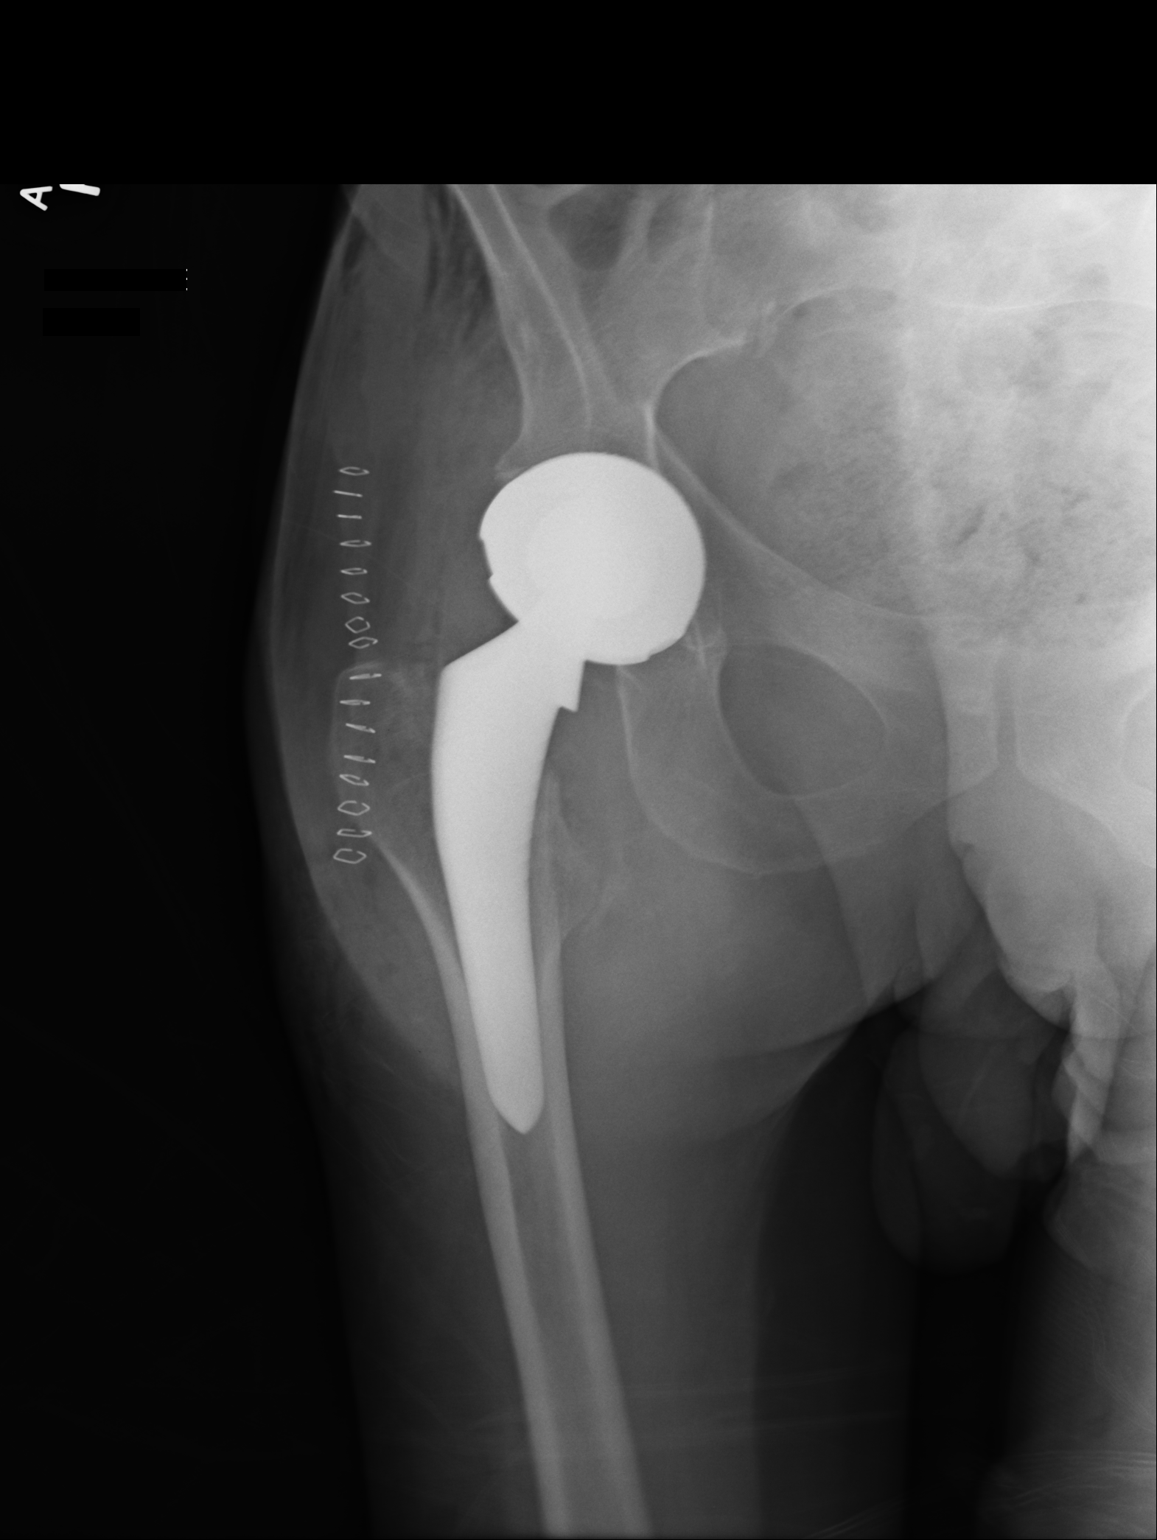
[im 2/2]
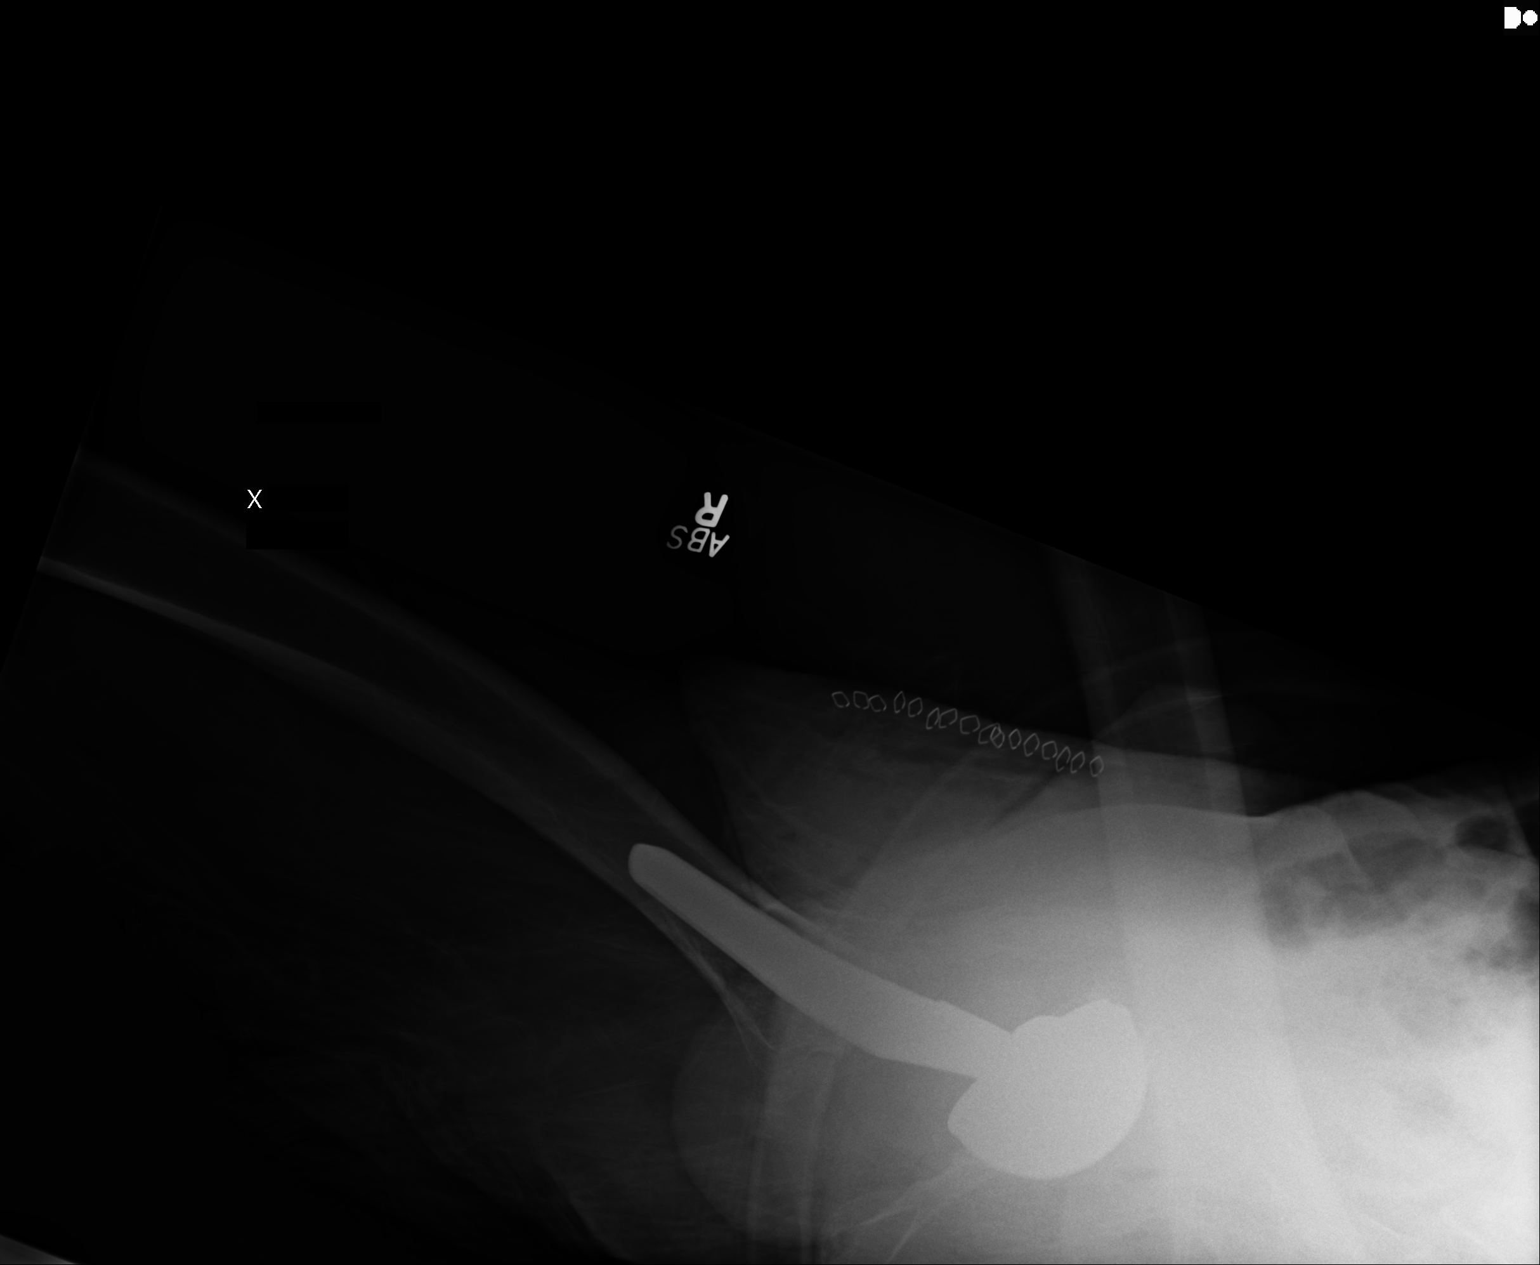

[2 of 2 positions shown; findings below may reference images not displayed]

FINDINGS: Two views of the right hip demonstrate postoperative changes of
bipolar hemiarthroplasty in the right hip. No periprosthetic
fracture or other immediate complicating features. Small amount of
gas in the hip joint and overlying soft tissues, with lateral skin
staples in place. Femoral head is properly located.
IMPRESSION: 1. Postoperative changes of right hip bipolar hemiarthroplasty
without acute complicating features, as above.

## 2018-01-04 IMAGING — CR DG HIP (WITH PELVIS) OPERATIVE*R*
3 series · 3 of 3 positions shown · non-contrast
Comparison: Right femur radiograph 02/06/2015.

CLINICAL DATA: 54-year-old male status post right hip replacement.

EXAM:
OPERATIVE RIGHT HIP (WITH PELVIS IF PERFORMED) 3 VIEWS
TECHNIQUE: Fluoroscopic spot image(s) were submitted for interpretation
post-operatively.

[cont. (1 of 3)]
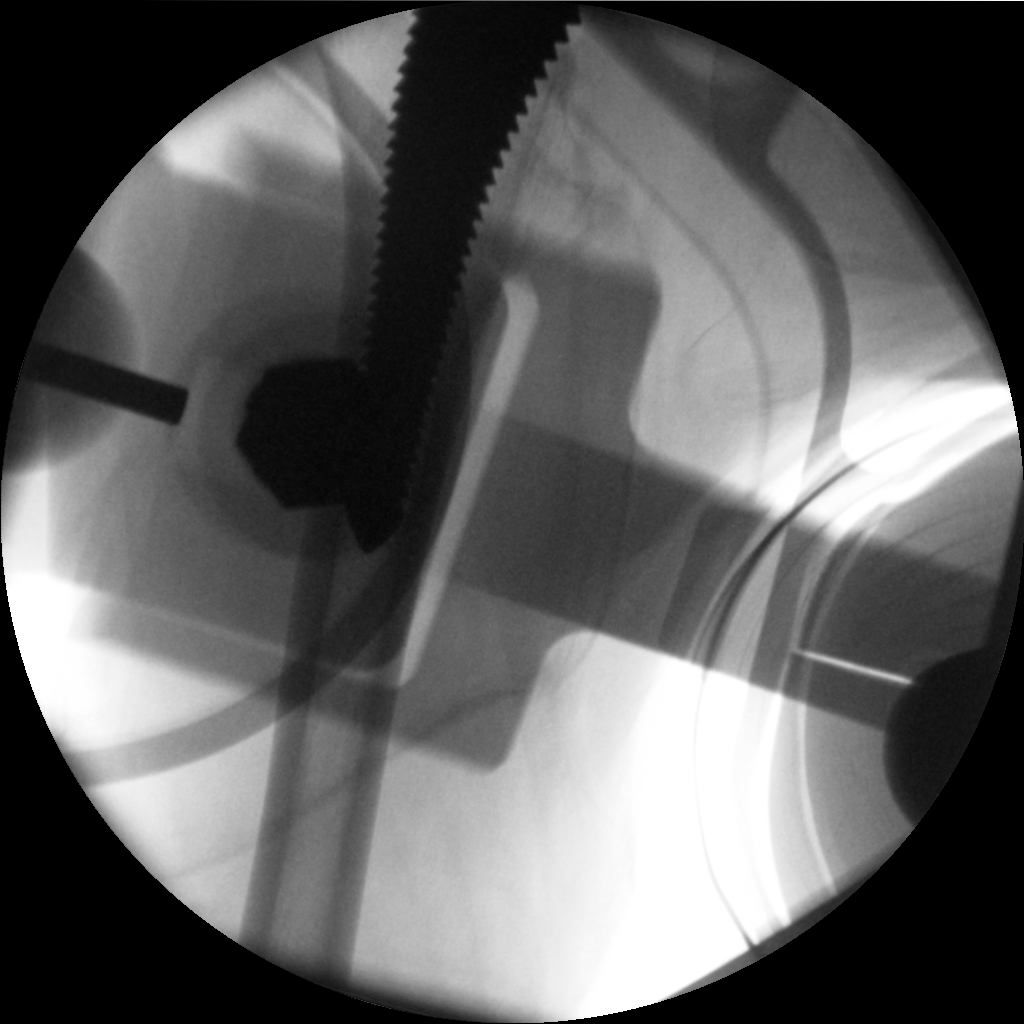

[cont. (2 of 3)]
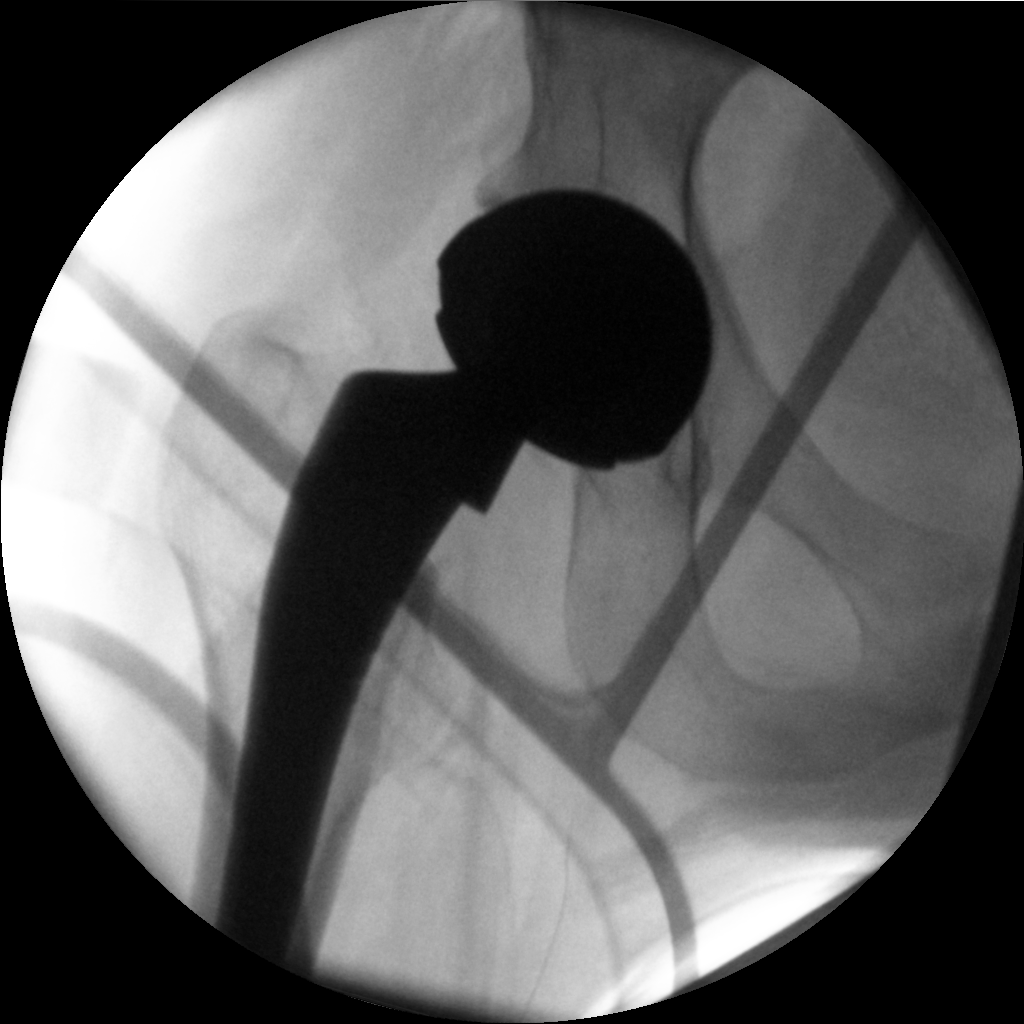

[cont. (3 of 3)]
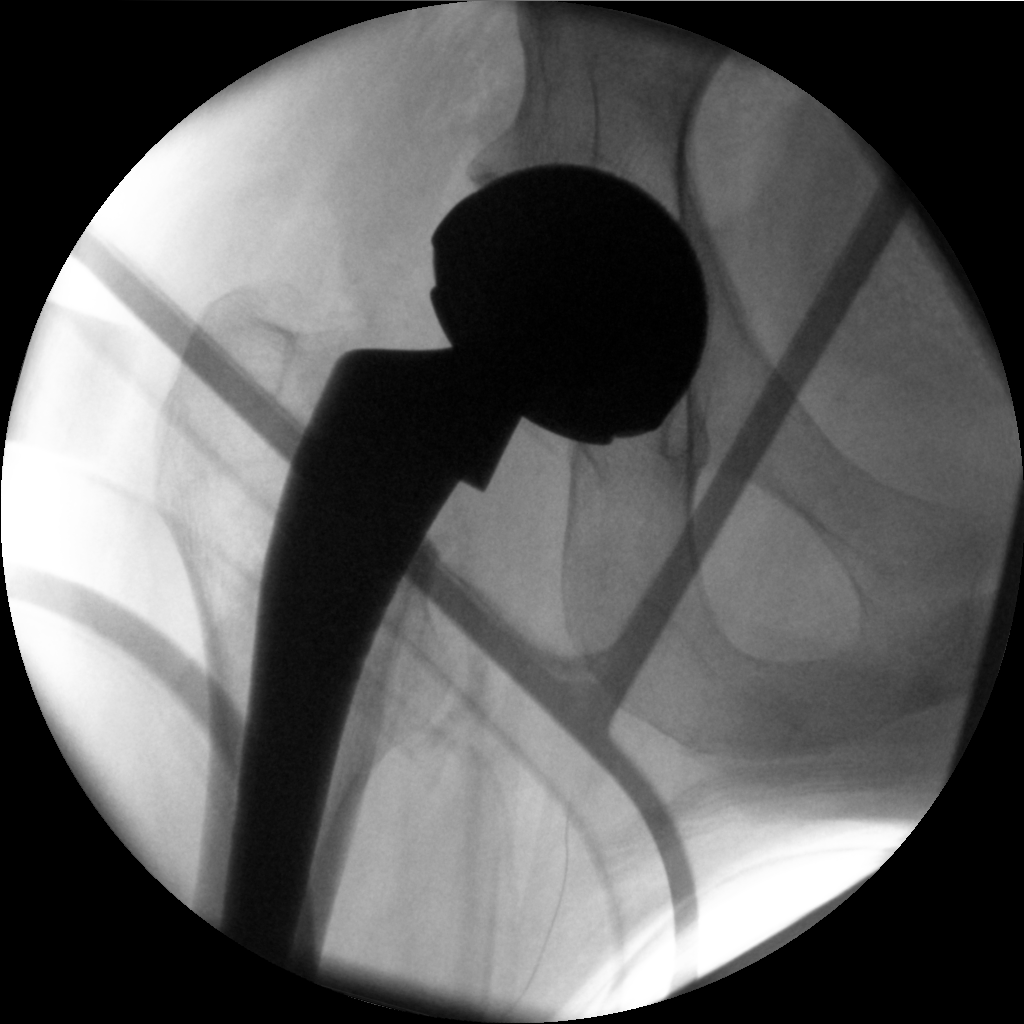

[3 of 3 positions shown; findings below may reference images not displayed]

FINDINGS: 3 intraoperative views of the right hip demonstrate performance of
right hip bipolar hemiarthroplasty. No definite periprosthetic
fracture identified on this limited examination.
IMPRESSION: 1. Intraoperative documentation of right hip bipolar
hemiarthroplasty.

## 2021-01-22 ENCOUNTER — Other Ambulatory Visit: Payer: Self-pay

## 2021-01-22 ENCOUNTER — Emergency Department (HOSPITAL_COMMUNITY)
Admission: EM | Admit: 2021-01-22 | Discharge: 2021-01-22 | Disposition: A | Discharge status: Skilled Nursing Facility | Payer: Medicare (Managed Care) | Attending: Emergency Medicine | Admitting: Emergency Medicine

## 2021-01-22 ENCOUNTER — Encounter (HOSPITAL_COMMUNITY): Payer: Self-pay | Admitting: *Deleted

## 2021-01-22 DIAGNOSIS — Z96641 Presence of right artificial hip joint: Secondary | ICD-10-CM | POA: Insufficient documentation

## 2021-01-22 DIAGNOSIS — W06XXXA Fall from bed, initial encounter: Secondary | ICD-10-CM | POA: Insufficient documentation

## 2021-01-22 DIAGNOSIS — Z7982 Long term (current) use of aspirin: Secondary | ICD-10-CM | POA: Insufficient documentation

## 2021-01-22 DIAGNOSIS — S0181XA Laceration without foreign body of other part of head, initial encounter: Secondary | ICD-10-CM

## 2021-01-22 DIAGNOSIS — Y92129 Unspecified place in nursing home as the place of occurrence of the external cause: Secondary | ICD-10-CM | POA: Insufficient documentation

## 2021-01-22 DIAGNOSIS — S01111A Laceration without foreign body of right eyelid and periocular area, initial encounter: Secondary | ICD-10-CM | POA: Insufficient documentation

## 2021-01-22 DIAGNOSIS — Z23 Encounter for immunization: Secondary | ICD-10-CM | POA: Diagnosis not present

## 2021-01-22 DIAGNOSIS — I10 Essential (primary) hypertension: Secondary | ICD-10-CM | POA: Insufficient documentation

## 2021-01-22 DIAGNOSIS — F0390 Unspecified dementia without behavioral disturbance: Secondary | ICD-10-CM | POA: Insufficient documentation

## 2021-01-22 DIAGNOSIS — W19XXXA Unspecified fall, initial encounter: Secondary | ICD-10-CM

## 2021-01-22 MED ORDER — TETANUS-DIPHTH-ACELL PERTUSSIS 5-2.5-18.5 LF-MCG/0.5 IM SUSY
0.5000 mL | PREFILLED_SYRINGE | Freq: Once | INTRAMUSCULAR | Status: AC
  Administered 2021-01-22: 13:00:00 0.5 mL via INTRAMUSCULAR
  Filled 2021-01-22: qty 0.5

## 2021-01-22 NOTE — ED Triage Notes (Signed)
Patient presents to ed via [facility] states he was and unwitnessed fall from his bed. Patient has history of Cerebral Palsy small lac. Above right eyebrow denies pain, per ems staff states patient is at his baseline

## 2021-01-22 NOTE — ED Provider Notes (Signed)
[facility] EMERGENCY DEPARTMENT Provider Note   CSN: [id number] Arrival date & time: 01/22/21  1214     History Chief Complaint  Patient presents with   Maurice Clayton is a 60 y.o. male.  HPI    60 year old male with history of quadriplegic cerebral palsy, dementia, depression, hypertension, hypothyroidism, chronic pain syndrome who presents with concern for fall.  Per facility, he had an unwitnessed fall from his bed.  Brother in law at bedside reports he is at his baseline.  He denies any areas of pain.  Specifically denies neck pain, chest pain, belly pain.  Brother-in-law also states that he is usually able to let them know if he is having pain.  He did not have any concerns for fever, cough, or other acute medical concerns.  He is able to answer his name, yes and no questions, is difficult to understand.   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   PBA (pseudobulbar affect) 04/20/2015   Femur neck fracture (HCC) 03/27/2015   Slow transit constipation 03/27/2015   Severe major depression, single episode, with psychotic features (HCC) 03/27/2015   Left renal mass 03/27/2015   B12 deficiency 07/21/2014   Quadriplegic cerebral palsy (HCC) 07/21/2014   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) 07/21/2014    Past Surgical History:  Procedure Laterality Date   Dental procedure     foot surgeries     bilateral for cerebral palsy   TOTAL HIP ARTHROPLASTY Right 02/09/2015   Procedure: TOTAL HIP ARTHROPLASTY ANTERIOR APPROACH;  Surgeon: Kennedy Bucker, MD;   Location: [facility] ORS;  Service: Orthopedics;  Laterality: Right;       Family History  Problem Relation Age of Onset   Dementia Mother    CAD Brother    Aortic aneurysm Brother    Aortic aneurysm Father    Kidney cancer Neg Hx    Kidney disease Neg Hx    Prostate cancer Neg Hx     Social History   Tobacco Use   Smoking status: Never   Smokeless tobacco: Never  Substance Use Topics   Alcohol use: No   Drug use: No    Home Medications Prior to Admission medications   Medication Sig Start Date End Date Taking? Authorizing Provider  acetaminophen (TYLENOL) 325 MG tablet Take 650 mg by mouth 3 (three) times daily. 325 mg every 6 hours prn    [provider]  aspirin EC 81 MG tablet Take 81 mg by mouth every evening.    [provider]  cholecalciferol (VITAMIN D) 1000 units tablet Take 1,000 Units by mouth daily.    [provider]  clonazePAM (KLONOPIN) 0.5 MG tablet Take 0.5 mg by mouth at bedtime.    [provider]  clonazePAM (KLONOPIN) 1 MG tablet Take 1 mg by mouth every morning.    [provider]  Dextromethorphan-Quinidine (NUEDEXTA) 20-10 MG CAPS Take 1 tablet by mouth 2 (two) times daily.    [provider]  docusate  sodium (COLACE) 100 MG capsule Take 1 capsule (100 mg total) by mouth 2 (two) times daily. 02/13/15   Alford Highland, MD  escitalopram (LEXAPRO) 20 MG tablet Take 20 mg by mouth daily.    [provider]  hydrocortisone (ANUSOL-HC) 25 MG suppository Place 25 mg rectally 2 (two) times daily as needed.     [provider]  memantine (NAMENDA XR) 14 MG CP24 24 hr capsule Take 14 mg by mouth daily.    [provider]  mirtazapine (REMERON) 15 MG tablet Take 15 mg by mouth every evening.     [provider]  oxyCODONE (OXY IR/ROXICODONE) 5 MG immediate release tablet Take one tablet by mouth before meals and at bedtime for pain 07/11/16   Reed, Tiffany L, DO  polyethylene  glycol (MIRALAX / GLYCOLAX) packet Take 17 g by mouth daily.    [provider]  risperiDONE (RISPERDAL) 1 MG tablet Take 1 mg by mouth 2 (two) times daily.     [provider]  rivastigmine (EXELON) 1.5 MG capsule Take 1.5 mg by mouth 2 (two) times daily.    [provider]    Allergies    Patient has no known allergies.  Review of Systems   Review of Systems  Unable to perform ROS: Dementia   Physical Exam Updated Vital Signs BP (!) 115/98 (BP Location: Right Arm)    Pulse 80    Temp 98.5 F (36.9 C) (Temporal)    Resp (!) 22    SpO2 92%   Physical Exam Vitals and nursing note reviewed.  Constitutional:      General: He is not in acute distress.    Appearance: Normal appearance. He is not ill-appearing, toxic-appearing or diaphoretic.  HENT:     Head: Normocephalic.     Comments: 1cm superficial laceration right eyebrow     Right Ear: Tympanic membrane normal.     Left Ear: Tympanic membrane normal.  Eyes:     Conjunctiva/sclera: Conjunctivae normal.  Cardiovascular:     Rate and Rhythm: Normal rate and regular rhythm.     Pulses: Normal pulses.  Pulmonary:     Effort: Pulmonary effort is normal. No respiratory distress.  Chest:     Chest wall: No tenderness.  Musculoskeletal:        General: No tenderness (denies), deformity or signs of injury.     Cervical back: No rigidity.  Skin:    General: Skin is warm and dry.     Coloration: Skin is not jaundiced or pale.  Neurological:     Mental Status: He is alert. Mental status is at baseline.    ED Results / Procedures / Treatments   Labs (all labs ordered are listed, but only abnormal results are displayed) Labs Reviewed - No data to display  EKG None  Radiology No results found.  Procedures .Marland KitchenLaceration Repair  Date/Time: 01/22/2021 3:23 PM Performed by: Alvira Monday, MD Authorized by: Alvira Monday, MD   Consent:    Consent obtained:  Verbal   Consent given by:   Guardian and patient Universal protocol:    Patient identity confirmed:  Verbally with patient Laceration details:    Location:  Face   Face location:  R eyebrow   Length (cm):  1 Exploration:    Limited defect created (wound extended): no     Hemostasis achieved with:  Direct pressure Skin repair:    Repair method:  Tissue adhesive Approximation:    Approximation:  Close  Repair type:    Repair type:  Simple Post-procedure details:    Dressing:  Open (no dressing)   Procedure completion:  Tolerated well, no immediate complications   Medications Ordered in ED Medications  Tdap (BOOSTRIX) injection 0.5 mL (0.5 mLs Intramuscular Given 01/22/21 1301)    ED Course  I have reviewed the triage vital signs and the nursing notes.  Pertinent labs & imaging results that were available during my care of the patient were reviewed by me and considered in my medical decision making (see chart for details).    MDM Rules/Calculators/A&P                         60 year old male with history of quadriplegic cerebral palsy, dementia, depression, hypertension, hypothyroidism, chronic pain syndrome who presents with concern for fall out of bed.  Ordered CT head and CSpine intitially as history is limited from Mr. Castilla, however he was not able to lay still and discussing more with his brother in law he is at baseline.  He is not on anticoagulation, is acting himself, denying any pain and we have a low suspicion for clinically significant intracranial injury. Has notes from hospice/palliative care from 2019.    Laceration repaired with dermabond and tolerated well.  Patient discharged in stable condition with understanding of reasons to return.       Final Clinical Impression(s) / ED Diagnoses Final diagnoses:  Fall, initial encounter  Facial laceration, initial encounter    Rx / DC Orders ED Discharge Orders     None        Alvira Monday, MD 01/22/21 1524

## 2022-07-26 ENCOUNTER — Emergency Department (HOSPITAL_COMMUNITY): Payer: Medicare (Managed Care)

## 2022-07-26 ENCOUNTER — Emergency Department (HOSPITAL_COMMUNITY)
Admission: EM | Admit: 2022-07-26 | Discharge: 2022-07-26 | Disposition: A | Discharge status: Home / Self Care | Payer: Medicare (Managed Care) | Attending: Emergency Medicine | Admitting: Emergency Medicine

## 2022-07-26 ENCOUNTER — Encounter (HOSPITAL_COMMUNITY): Payer: Self-pay

## 2022-07-26 DIAGNOSIS — F0390 Unspecified dementia without behavioral disturbance: Secondary | ICD-10-CM | POA: Diagnosis not present

## 2022-07-26 DIAGNOSIS — W19XXXA Unspecified fall, initial encounter: Secondary | ICD-10-CM

## 2022-07-26 DIAGNOSIS — K5900 Constipation, unspecified: Secondary | ICD-10-CM

## 2022-07-26 DIAGNOSIS — I10 Essential (primary) hypertension: Secondary | ICD-10-CM | POA: Insufficient documentation

## 2022-07-26 DIAGNOSIS — Z7982 Long term (current) use of aspirin: Secondary | ICD-10-CM | POA: Insufficient documentation

## 2022-07-26 DIAGNOSIS — Z79899 Other long term (current) drug therapy: Secondary | ICD-10-CM | POA: Insufficient documentation

## 2022-07-26 DIAGNOSIS — R4182 Altered mental status, unspecified: Secondary | ICD-10-CM | POA: Diagnosis present

## 2022-07-26 LAB — CBC WITH DIFFERENTIAL/PLATELET
Abs Immature Granulocytes: 0.01 10*3/uL (ref 0.00–0.07)
Basophils Absolute: 0 10*3/uL (ref 0.0–0.1)
Basophils Relative: 1 %
Eosinophils Absolute: 0.2 10*3/uL (ref 0.0–0.5)
Eosinophils Relative: 4 %
HCT: 49.4 % (ref 39.0–52.0)
Hemoglobin: 16 g/dL (ref 13.0–17.0)
Immature Granulocytes: 0 %
Lymphocytes Relative: 30 %
Lymphs Abs: 1.2 10*3/uL (ref 0.7–4.0)
MCH: 29.9 pg (ref 26.0–34.0)
MCHC: 32.4 g/dL (ref 30.0–36.0)
MCV: 92.3 fL (ref 80.0–100.0)
Monocytes Absolute: 0.2 10*3/uL (ref 0.1–1.0)
Monocytes Relative: 4 %
Neutro Abs: 2.5 10*3/uL (ref 1.7–7.7)
Neutrophils Relative %: 61 %
Platelets: 164 10*3/uL (ref 150–400)
RBC: 5.35 MIL/uL (ref 4.22–5.81)
RDW: 12.4 % (ref 11.5–15.5)
WBC: 4.1 10*3/uL (ref 4.0–10.5)
nRBC: 0 % (ref 0.0–0.2)

## 2022-07-26 LAB — CBG MONITORING, ED: Glucose-Capillary: 117 mg/dL — ABNORMAL HIGH (ref 70–99)

## 2022-07-26 LAB — URINALYSIS, ROUTINE W REFLEX MICROSCOPIC
Bilirubin Urine: NEGATIVE
Glucose, UA: NEGATIVE mg/dL
Ketones, ur: NEGATIVE mg/dL
Leukocytes,Ua: NEGATIVE
Nitrite: NEGATIVE
Protein, ur: NEGATIVE mg/dL
RBC / HPF: >50 RBC/hpf (ref 0–5)
Specific Gravity, Urine: 1.008 (ref 1.005–1.030)
pH: 8 (ref 5.0–8.0)

## 2022-07-26 LAB — COMPREHENSIVE METABOLIC PANEL
ALT: 8 U/L (ref 0–44)
AST: 19 U/L (ref 15–41)
Albumin: 3.8 g/dL (ref 3.5–5.0)
Alkaline Phosphatase: 59 U/L (ref 38–126)
Anion gap: 10 (ref 5–15)
BUN: 10 mg/dL (ref 8–23)
CO2: 21 mmol/L — ABNORMAL LOW (ref 22–32)
Calcium: 8.8 mg/dL — ABNORMAL LOW (ref 8.9–10.3)
Chloride: 106 mmol/L (ref 98–111)
Creatinine, Ser: 0.94 mg/dL (ref 0.61–1.24)
GFR, Estimated: >60 mL/min (ref >60)
Glucose, Bld: 117 mg/dL — ABNORMAL HIGH (ref 70–99)
Potassium: 3.9 mmol/L (ref 3.5–5.1)
Sodium: 137 mmol/L (ref 135–145)
Total Bilirubin: 0.7 mg/dL (ref 0.3–1.2)
Total Protein: 6 g/dL — ABNORMAL LOW (ref 6.5–8.1)

## 2022-07-26 LAB — TSH: TSH: 2.548 u[IU]/mL (ref 0.350–4.500)

## 2022-07-26 MED ORDER — GLYCERIN (ADULT) 2 G RE SUPP: 1.0000 | RECTAL | 0 refills | Status: AC | PRN

## 2022-07-26 MED ORDER — POLYETHYLENE GLYCOL 3350 17 G PO PACK: 17.0000 g | PACK | Freq: Every day | ORAL | 0 refills | Status: AC

## 2022-07-26 NOTE — ED Notes (Signed)
This RN witnesssed Elberta, NT and Carlton, RN picking up pt up off of floor. No one at desk witnessed fall. MD notified. No obvious injuries.

## 2022-07-26 NOTE — ED Notes (Signed)
Cbg 117

## 2022-07-26 NOTE — ED Provider Notes (Signed)
  Physical Exam  BP (!) 126/97 (BP Location: Right Arm)   Pulse 97   Temp 98.3 F (36.8 C) (Oral)   Resp 18   Ht 5\' 4"  (1.626 m)   Wt 60 kg   SpO2 95%   BMI 22.71 kg/m   Physical Exam  Procedures  Procedures  ED Course / MDM   Clinical Course as of 07/26/22 1850  Tue Jul 26, 2022  442 62 year old male with a history of cerebral palsy, dementia, hypertension, and hypothyroidism who presents to the emergency department with abnormal behavior.  Recently patient has been playing with feces.  Apparently this is abnormal behavior for him.  Off going provider did call his facility who reports that he is typically alert and oriented x 1 and is now at his baseline mental status.  Did have a fall here in the emergency department because he was trying to crawl out of bed which was unwitnessed so is getting CT scans at this time.  Has labs and urinalysis that are pending currently. [RP]  1849 Call facility to update them.  Based on urinalysis to not appear to have a urinary tract infection.  No traumatic injuries noted on his imaging.  Did have some constipation that was noted on his images and instructed his facility to start him on MiraLAX and given glycerin suppositories daily until his bowel movements normalize. [RP]    Clinical Course User Index [RP] Rondel Baton, MD   Medical Decision Making Amount and/or Complexity of Data Reviewed Labs: ordered. Radiology: ordered.  Risk OTC drugs.      Rondel Baton, MD 07/26/22 [phone]

## 2022-07-26 NOTE — ED Notes (Signed)
Pt being transferred to [facility] following discharge by [facility].  Pt leaving ED in stable condition at this time via [facility] stretcher.

## 2022-07-26 NOTE — ED Notes (Signed)
[facility] present to transport pt back.

## 2022-07-26 NOTE — ED Notes (Signed)
Pt had a large bowel movement

## 2022-07-26 NOTE — Discharge Instructions (Signed)
You were seen for your fall in the emergency department.  You were found to be constipated.  At home, please please take MiraLAX daily and use the glycerin suppositories daily until you have normal bowel movements.    Check your MyChart online for the results of any tests that had not resulted by the time you left the emergency department.   Follow-up with your primary doctor in 2-3 days regarding your visit.    Return immediately to the emergency department if you experience any of the following: Severe head trauma, decreased responsiveness, or any other concerning symptoms.    Thank you for visiting our Emergency Department. It was a pleasure taking care of you today.

## 2022-07-26 NOTE — ED Triage Notes (Signed)
Pt arrives from facility by [facility]. Pt sent in for abnormal behavior. Pt has been trying to get out of bed and playing in feces at facility this is abnormal for him.  Cbg 116 Spo2 97% Hr 60 bpm Bp 138/82

## 2022-07-26 NOTE — ED Notes (Signed)
Pt awaiting [facility] transport to [facility].  Sitter at bedside due to pt's prior fall.  Bed in lowest position, wheels locked.

## 2022-07-26 NOTE — ED Provider Notes (Signed)
[place] EMERGENCY DEPARTMENT AT [facility] Provider Note   CSN: [id number] Arrival date & time: 07/26/22  1144     History  Chief Complaint  Patient presents with   Altered Mental Status    ERHARDT DADA is a 62 y.o. male.  Pt is a 62 yo male with pmhx significant for CP, Dementia, HTN, and hyperthyroidism.  Pt apparently has been having some abnormal behavior.  He is bed bound and has been trying to get oob.  Pt is unable to give any hx.  Per EMS, SNF said his current behavior is his normal and he is at his baseline MS.       Home Medications Prior to Admission medications   Medication Sig Start Date End Date Taking? Authorizing Provider  acetaminophen (TYLENOL) 325 MG tablet Take 650 mg by mouth 3 (three) times daily. 325 mg every 6 hours prn    [provider]  aspirin EC 81 MG tablet Take 81 mg by mouth every evening.    [provider]  cholecalciferol (VITAMIN D) 1000 units tablet Take 1,000 Units by mouth daily.    [provider]  clonazePAM (KLONOPIN) 0.5 MG tablet Take 0.5 mg by mouth at bedtime.    [provider]  clonazePAM (KLONOPIN) 1 MG tablet Take 1 mg by mouth every morning.    [provider]  Dextromethorphan-Quinidine (NUEDEXTA) 20-10 MG CAPS Take 1 tablet by mouth 2 (two) times daily.    [provider]  docusate sodium (COLACE) 100 MG capsule Take 1 capsule (100 mg total) by mouth 2 (two) times daily. 02/13/15   Alford Highland, MD  escitalopram (LEXAPRO) 20 MG tablet Take 20 mg by mouth daily.    [provider]  hydrocortisone (ANUSOL-HC) 25 MG suppository Place 25 mg rectally 2 (two) times daily as needed.     [provider]  memantine (NAMENDA XR) 14 MG CP24 24 hr capsule Take 14 mg by mouth daily.    [provider]  mirtazapine (REMERON) 15 MG tablet Take 15 mg by mouth every evening.     [provider]  oxyCODONE (OXY IR/ROXICODONE) 5 MG immediate  release tablet Take one tablet by mouth before meals and at bedtime for pain 07/11/16   Reed, Tiffany L, DO  polyethylene glycol (MIRALAX / GLYCOLAX) packet Take 17 g by mouth daily.    [provider]  risperiDONE (RISPERDAL) 1 MG tablet Take 1 mg by mouth 2 (two) times daily.     [provider]  rivastigmine (EXELON) 1.5 MG capsule Take 1.5 mg by mouth 2 (two) times daily.    [provider]      Allergies    Patient has no known allergies.    Review of Systems   Review of Systems  Unable to perform ROS: Dementia  All other systems reviewed and are negative.   Physical Exam Updated Vital Signs BP (!) 126/97 (BP Location: Right Arm)   Pulse 97   Temp 98.3 F (36.8 C) (Oral)   Resp 18   Ht 5\' 4"  (1.626 m)   Wt 60 kg   SpO2 95%   BMI 22.71 kg/m  Physical Exam Vitals and nursing note reviewed.  HENT:     Head: Normocephalic and atraumatic.     Right Ear: External ear normal.     Left Ear: External ear normal.     Nose: Nose normal.     Mouth/Throat:  Mouth: Mucous membranes are moist.     Pharynx: Oropharynx is clear.  Eyes:     Extraocular Movements: Extraocular movements intact.     Conjunctiva/sclera: Conjunctivae normal.     Pupils: Pupils are equal, round, and reactive to light.  Cardiovascular:     Rate and Rhythm: Normal rate and regular rhythm.     Pulses: Normal pulses.     Heart sounds: Normal heart sounds.  Pulmonary:     Effort: Pulmonary effort is normal.     Breath sounds: Normal breath sounds.  Abdominal:     General: Abdomen is flat. Bowel sounds are normal.     Palpations: Abdomen is soft.  Musculoskeletal:        General: Normal range of motion.     Cervical back: Normal range of motion and neck supple.  Skin:    General: Skin is warm.     Capillary Refill: Capillary refill takes less than 2 seconds.  Neurological:     Mental Status: He is alert.     Comments: Pt is moving all 4 extremities, he is not oriented.   He is moving quite a bit in the bed.  He can say his name, but that is all I can understand.  Psychiatric:     Comments: Unable to assess     ED Results / Procedures / Treatments   Labs (all labs ordered are listed, but only abnormal results are displayed) Labs Reviewed  COMPREHENSIVE METABOLIC PANEL - Abnormal; Notable for the following components:      Result Value   CO2 21 (*)    Glucose, Bld 117 (*)    Calcium 8.8 (*)    Total Protein 6.0 (*)    All other components within normal limits  CBC WITH DIFFERENTIAL/PLATELET  URINALYSIS, ROUTINE W REFLEX MICROSCOPIC  TSH  CBG MONITORING, ED    EKG None EKG with HR 87, no stemi  Radiology DG Chest 1 View  Result Date: 07/26/2022 CLINICAL DATA:  Altered mental status. EXAM: CHEST  1 VIEW COMPARISON:  X-ray 02/11/2015 and older FINDINGS: Underinflation. No consolidation, pneumothorax or effusion. No edema. Normal cardiopericardial silhouette. Lordotic x-ray. Film is under penetrated. IMPRESSION: Underinflation.  Lordotic x-ray.  No acute cardiopulmonary disease Electronically Signed   By: Karen Kays M.D.   On: 07/26/2022 13:42    Procedures Procedures    Medications Ordered in ED Medications - No data to display  ED Course/ Medical Decision Making/ A&P                             Medical Decision Making Amount and/or Complexity of Data Reviewed Labs: ordered. Radiology: ordered.   This patient presents to the ED for concern of ams, this involves an extensive number of treatment options, and is a complaint that carries with it a high risk of complications and morbidity.  The differential diagnosis includes infection, electrolyte abn   Co morbidities that complicate the patient evaluation  CP, Dementia, HTN, and hyperthyroidism   Additional history obtained:  Additional history obtained from epic chart review External records from outside source obtained and reviewed including EMS report   Lab Tests:  I  Ordered, and personally interpreted labs.  The pertinent results include:  cbc nl, cmp nl   Imaging Studies ordered:  I ordered imaging studies including cxr, ct head/c-spine, pelvis I independently visualized and interpreted imaging which showed  CXR: Underinflation.  Lordotic x-ray.  No  acute cardiopulmonary disease  I agree with the radiologist interpretation   Cardiac Monitoring:  The patient was maintained on a cardiac monitor.  I personally viewed and interpreted the cardiac monitored which showed an underlying rhythm of: nsr   Medicines ordered and prescription drug management:   I have reviewed the patients home medicines and have made adjustments as needed   Test Considered:  ct   Problem List / ED Course:  AMS:  urine and scans pending at shift change.   Reevaluation:  After the interventions noted above, I reevaluated the patient and found that they have :improved   Social Determinants of Health:  Loves in snf   Dispostion:  Pending at shift change        Final Clinical Impression(s) / ED Diagnoses Final diagnoses:  Altered mental status, unspecified altered mental status type    Rx / DC Orders ED Discharge Orders     None         Jacalyn Lefevre, MD 07/26/22 [phone]

## 2022-07-27 ENCOUNTER — Other Ambulatory Visit: Payer: Self-pay

## 2022-07-27 ENCOUNTER — Emergency Department (HOSPITAL_COMMUNITY)
Admission: EM | Admit: 2022-07-27 | Discharge: 2022-07-27 | Disposition: A | Discharge status: Skilled Nursing Facility | Payer: Medicare (Managed Care) | Attending: Emergency Medicine | Admitting: Emergency Medicine

## 2022-07-27 ENCOUNTER — Emergency Department (HOSPITAL_COMMUNITY): Payer: Medicare (Managed Care)

## 2022-07-27 DIAGNOSIS — Z7982 Long term (current) use of aspirin: Secondary | ICD-10-CM | POA: Insufficient documentation

## 2022-07-27 DIAGNOSIS — S0990XA Unspecified injury of head, initial encounter: Secondary | ICD-10-CM | POA: Diagnosis present

## 2022-07-27 DIAGNOSIS — W050XXA Fall from non-moving wheelchair, initial encounter: Secondary | ICD-10-CM | POA: Insufficient documentation

## 2022-07-27 DIAGNOSIS — F0390 Unspecified dementia without behavioral disturbance: Secondary | ICD-10-CM | POA: Insufficient documentation

## 2022-07-27 LAB — CBG MONITORING, ED: Glucose-Capillary: 148 mg/dL — ABNORMAL HIGH (ref 70–99)

## 2022-07-27 MED ORDER — LORAZEPAM 2 MG/ML IJ SOLN
1.0000 mg | Freq: Once | INTRAMUSCULAR | Status: AC
  Administered 2022-07-27: 1 mg via INTRAMUSCULAR
  Filled 2022-07-27: qty 1

## 2022-07-27 NOTE — ED Provider Notes (Signed)
[name] Provider Note   CSN: [id number] Arrival date & time: 07/27/22  1254     History  Chief Complaint  Patient presents with   Fall    Coming from SNF. Hx of CP. Pt unwitnessed fall out of wc. Pt sent in for eval. VSS pta. No obvious injuries or trauma. Pt does follow some commands at baseline.     Maurice Clayton is a 62 y.o. male.  Patient is a 62 year old male with a past medical history of cerebral palsy and dementia ANO x 1 at baseline presenting to the emergency department after a fall.  Per EMS, the patient had an unwitnessed fall out of his wheelchair.  There recommended that he come to the emergency department to be evaluated.  The patient reports that he is unsure how he fell but does report that he hits his head.  He denied loss of consciousness.  He denies any other pain or injuries to the fall.  He denies any associated lightheadedness or chest pain.  Of note, the patient was in the emergency department yesterday and had a fall as well.  The history is provided by the patient and the EMS personnel. History limited by: Level 5 caveat for dementia.  Fall       Home Medications Prior to Admission medications   Medication Sig Start Date End Date Taking? Authorizing Provider  acetaminophen (TYLENOL) 325 MG tablet Take 650 mg by mouth 3 (three) times daily. 325 mg every 6 hours prn    [provider]  aspirin EC 81 MG tablet Take 81 mg by mouth every evening.    [provider]  cholecalciferol (VITAMIN D) 1000 units tablet Take 1,000 Units by mouth daily.    [provider]  clonazePAM (KLONOPIN) 0.5 MG tablet Take 0.5 mg by mouth at bedtime.    [provider]  clonazePAM (KLONOPIN) 1 MG tablet Take 1 mg by mouth every morning.    [provider]  Dextromethorphan-Quinidine (NUEDEXTA) 20-10 MG CAPS Take 1 tablet by mouth 2 (two) times daily.    [provider]  docusate  sodium (COLACE) 100 MG capsule Take 1 capsule (100 mg total) by mouth 2 (two) times daily. 02/13/15   Alford Highland, MD  escitalopram (LEXAPRO) 20 MG tablet Take 20 mg by mouth daily.    [provider]  glycerin adult 2 g suppository Place 1 suppository rectally as needed for constipation. 07/26/22   Rondel Baton, MD  hydrocortisone (ANUSOL-HC) 25 MG suppository Place 25 mg rectally 2 (two) times daily as needed.     [provider]  memantine (NAMENDA XR) 14 MG CP24 24 hr capsule Take 14 mg by mouth daily.    [provider]  mirtazapine (REMERON) 15 MG tablet Take 15 mg by mouth every evening.     [provider]  oxyCODONE (OXY IR/ROXICODONE) 5 MG immediate release tablet Take one tablet by mouth before meals and at bedtime for pain 07/11/16   Reed, Tiffany L, DO  polyethylene glycol (MIRALAX / GLYCOLAX) packet Take 17 g by mouth daily.    [provider]  polyethylene glycol (MIRALAX) 17 g packet Take 17 g by mouth daily. 07/26/22   Rondel Baton, MD  risperiDONE (RISPERDAL) 1 MG tablet Take 1 mg by mouth 2 (two) times daily.     [provider]  rivastigmine (EXELON) 1.5 MG capsule Take 1.5 mg by mouth 2 (two)  times daily.    [provider]      Allergies    Patient has no known allergies.    Review of Systems   Review of Systems  Physical Exam Updated Vital Signs BP (!) 123/90   Pulse 95   Temp 97.6 F (36.4 C) (Oral)   Resp 18   Ht 5\' 7"  (1.702 m)   Wt 65.8 kg   SpO2 95%   BMI 22.71 kg/m  Physical Exam Vitals and nursing note reviewed.  Constitutional:      General: He is not in acute distress.    Appearance: Normal appearance.  HENT:     Head: Normocephalic and atraumatic.     Nose: Nose normal.     Mouth/Throat:     Mouth: Mucous membranes are moist.     Pharynx: Oropharynx is clear.  Eyes:     Extraocular Movements: Extraocular movements intact.     Conjunctiva/sclera: Conjunctivae normal.   Neck:     Comments: No midline neck tenderness Cardiovascular:     Rate and Rhythm: Normal rate and regular rhythm.     Heart sounds: Normal heart sounds.  Pulmonary:     Effort: Pulmonary effort is normal.     Breath sounds: Normal breath sounds.  Abdominal:     General: Abdomen is flat.     Palpations: Abdomen is soft.     Tenderness: There is no abdominal tenderness.  Musculoskeletal:        General: Normal range of motion.     Cervical back: Normal range of motion and neck supple.     Comments: No midline back tenderness No bony tenderness to bilateral upper or lower extremities Contractures of all 4 extremities  Skin:    General: Skin is warm and dry.  Neurological:     General: No focal deficit present.     Mental Status: He is alert. Mental status is at baseline.     Sensory: No sensory deficit.     Motor: No weakness.  Psychiatric:        Mood and Affect: Mood normal.        Behavior: Behavior normal.     ED Results / Procedures / Treatments   Labs (all labs ordered are listed, but only abnormal results are displayed) Labs Reviewed  CBG MONITORING, ED - Abnormal; Notable for the following components:      Result Value   Glucose-Capillary 148 (*)    All other components within normal limits    EKG EKG Interpretation  Date/Time:  Wednesday July 27 2022 13:56:44 EDT Ventricular Rate:  90 PR Interval:  142 QRS Duration: 72 QT Interval:  316 QTC Calculation: 386 R Axis:   28 Text Interpretation: Normal sinus rhythm Normal ECG Interpretation limited secondary to artifact No significant change since last tracing Confirmed by Elayne Snare ([id number]) on 07/27/2022 2:00:59 PM  Radiology DG Pelvis 1-2 Views  Result Date: 07/26/2022 CLINICAL DATA:  Trauma, fall EXAM: PELVIS - 1-2 VIEW COMPARISON:  Previous studies including the examination of 02/09/2015 FINDINGS: There is previous right hip arthroplasty. No recent displaced fracture is seen. There is no  dislocation. There is no demonstrable hardware failure. Large amount of stool is seen in rectum. Transverse diameter of rectum measures 12.7 cm. IMPRESSION: No recent displaced fracture is seen. Previous right hip arthroplasty. Large amount of stool is seen in the rectum suggesting fecal impaction. Electronically Signed   By: Ernie Avena M.D.   On: 07/26/2022 17:14  CT Head Wo Contrast  Result Date: 07/26/2022 CLINICAL DATA:  Altered mental status, trauma EXAM: CT HEAD WITHOUT CONTRAST CT CERVICAL SPINE WITHOUT CONTRAST TECHNIQUE: Multidetector CT imaging of the head and cervical spine was performed following the standard protocol without intravenous contrast. Multiplanar CT image reconstructions of the cervical spine were also generated. RADIATION DOSE REDUCTION: This exam was performed according to the departmental dose-optimization program which includes automated exposure control, adjustment of the mA and/or kV according to patient size and/or use of iterative reconstruction technique. COMPARISON:  02/06/2015 CT head, no prior CT cervical spine. FINDINGS: CT HEAD FINDINGS Evaluation is somewhat limited by motion artifact. Brain: No evidence of acute infarct, hemorrhage, mass, mass effect, or midline shift. No hydrocephalus or extra-axial fluid collection. Hyperdense foci in the bilateral parietal lobes are unchanged from the prior exam, consistent with calcifications. Redemonstrated posterior fossa arachnoid cysts. Vascular: No hyperdense vessel. Skull: Negative for fracture or focal lesion. Sinuses/Orbits: No acute finding. Other: The mastoid air cells are well aerated. CT CERVICAL SPINE FINDINGS Evaluation is somewhat limited by motion artifact. Alignment: No traumatic listhesis. Mild dextrocurvature, which may be positional. Reversal of the normal cervical lordosis. Trace retrolisthesis of C2 on C3 and C3 on C4. Trace anterolisthesis of C4 on C5. Skull base and vertebrae: No acute fracture or  suspicious osseous lesion. Soft tissues and spinal canal: No prevertebral fluid or swelling. No visible canal hematoma. Disc levels: Degenerative changes in the cervical spine. Moderate spinal canal stenosis at C3-C4. Upper chest: Negative. IMPRESSION: 1. Evaluation is somewhat limited by motion artifact. Within this limitation, no acute intracranial process. 2. No acute fracture or traumatic listhesis in the cervical spine. Electronically Signed   By: Wiliam Ke M.D.   On: 07/26/2022 16:57   CT Cervical Spine Wo Contrast  Result Date: 07/26/2022 CLINICAL DATA:  Altered mental status, trauma EXAM: CT HEAD WITHOUT CONTRAST CT CERVICAL SPINE WITHOUT CONTRAST TECHNIQUE: Multidetector CT imaging of the head and cervical spine was performed following the standard protocol without intravenous contrast. Multiplanar CT image reconstructions of the cervical spine were also generated. RADIATION DOSE REDUCTION: This exam was performed according to the departmental dose-optimization program which includes automated exposure control, adjustment of the mA and/or kV according to patient size and/or use of iterative reconstruction technique. COMPARISON:  02/06/2015 CT head, no prior CT cervical spine. FINDINGS: CT HEAD FINDINGS Evaluation is somewhat limited by motion artifact. Brain: No evidence of acute infarct, hemorrhage, mass, mass effect, or midline shift. No hydrocephalus or extra-axial fluid collection. Hyperdense foci in the bilateral parietal lobes are unchanged from the prior exam, consistent with calcifications. Redemonstrated posterior fossa arachnoid cysts. Vascular: No hyperdense vessel. Skull: Negative for fracture or focal lesion. Sinuses/Orbits: No acute finding. Other: The mastoid air cells are well aerated. CT CERVICAL SPINE FINDINGS Evaluation is somewhat limited by motion artifact. Alignment: No traumatic listhesis. Mild dextrocurvature, which may be positional. Reversal of the normal cervical lordosis.  Trace retrolisthesis of C2 on C3 and C3 on C4. Trace anterolisthesis of C4 on C5. Skull base and vertebrae: No acute fracture or suspicious osseous lesion. Soft tissues and spinal canal: No prevertebral fluid or swelling. No visible canal hematoma. Disc levels: Degenerative changes in the cervical spine. Moderate spinal canal stenosis at C3-C4. Upper chest: Negative. IMPRESSION: 1. Evaluation is somewhat limited by motion artifact. Within this limitation, no acute intracranial process. 2. No acute fracture or traumatic listhesis in the cervical spine. Electronically Signed   By: Elaina Pattee.D.  On: 07/26/2022 16:57   DG Chest 1 View  Result Date: 07/26/2022 CLINICAL DATA:  Altered mental status. EXAM: CHEST  1 VIEW COMPARISON:  X-ray 02/11/2015 and older FINDINGS: Underinflation. No consolidation, pneumothorax or effusion. No edema. Normal cardiopericardial silhouette. Lordotic x-ray. Film is under penetrated. IMPRESSION: Underinflation.  Lordotic x-ray.  No acute cardiopulmonary disease Electronically Signed   By: Karen Kays M.D.   On: 07/26/2022 13:42    Procedures Procedures    Medications Ordered in ED Medications  LORazepam (ATIVAN) injection 1 mg (1 mg Intramuscular Given 07/27/22 1433)    ED Course/ Medical Decision Making/ A&P Clinical Course as of 07/27/22 1523  Wed Jul 27, 2022  1521 Patient signed out to Dr. Posey Rea pending CT scan. He was given dose of Ativan to facilitate CT. [VK]    Clinical Course User Index [VK] Rexford Maus, DO                             Medical Decision Making This patient presents to the ED with chief complaint(s) of fall, head injury with pertinent past medical history of CP, dementia which further complicates the presenting complaint. The complaint involves an extensive differential diagnosis and also carries with it a high risk of complications and morbidity.    The differential diagnosis includes considering ICH/mass effect in the  setting of head trauma and dementia, low risk canadian c-spine rule making c-spine fracture unlikely, no other traumatic injuries seen, considering possible syncopal fall secondary to arrhythmia, hypo or hyperglycemia, recent labs yesterday with no evidence of anemia or severe dehydration potential causes of syncope  Additional history obtained: Additional history obtained from EMS  Records reviewed recent ED records  ED Course and Reassessment: On patient's arrival he is hemodynamically stable in no acute distress and appears to be at his neurologic baseline.  He does report head trauma and due to his history of dementia will have a head CT performed, no other traumatic injuries.  Will have EKG and Accu-Chek to evaluate for causes of potential syncope.  Labs yesterday were otherwise within normal range with no signs of anemia, electrolyte derangement or severe dehydration as causes of his fall.  He will be closely reassessed.  Independent labs interpretation:  The following labs were independently interpreted: Accu-Chek within normal range  Independent visualization of imaging: -Pending     Amount and/or Complexity of Data Reviewed Radiology: ordered.  Risk Prescription drug management.          Final Clinical Impression(s) / ED Diagnoses Final diagnoses:  Fall from wheelchair, initial encounter    Rx / DC Orders ED Discharge Orders     None         Rexford Maus, DO 07/27/22 1523

## 2022-07-27 NOTE — ED Provider Notes (Signed)
  Physical Exam  BP (!) 123/90   Pulse 95   Temp 97.6 F (36.4 C) (Oral)   Resp 18   Ht 5\' 7"  (1.702 m)   Wt 65.8 kg   SpO2 95%   BMI 22.71 kg/m   Physical Exam Constitutional:      General: He is not in acute distress.    Appearance: Normal appearance.  HENT:     Head: Normocephalic and atraumatic.     Nose: No congestion or rhinorrhea.  Eyes:     General:        Right eye: No discharge.        Left eye: No discharge.     Extraocular Movements: Extraocular movements intact.     Pupils: Pupils are equal, round, and reactive to light.  Cardiovascular:     Rate and Rhythm: Normal rate and regular rhythm.     Heart sounds: No murmur heard. Pulmonary:     Effort: No respiratory distress.     Breath sounds: No wheezing or rales.  Abdominal:     General: There is no distension.     Tenderness: There is no abdominal tenderness.  Musculoskeletal:        General: Normal range of motion.     Cervical back: Normal range of motion.  Skin:    General: Skin is warm and dry.  Neurological:     General: No focal deficit present.     Mental Status: He is alert.     Procedures  Procedures  ED Course / MDM   Clinical Course as of 07/27/22 1655  Wed Jul 27, 2022  1521 Patient signed out to Dr. Posey Rea pending CT scan. He was given dose of Ativan to facilitate CT. [VK]    Clinical Course User Index [VK] Rexford Maus, DO   Medical Decision Making Amount and/or Complexity of Data Reviewed Radiology: ordered.  Risk Prescription drug management.   Patient received in handoff.  Fall pending CT head.  Plan for discharge if CT head is negative.  CT head is reassuringly negative and patient discharged       Glendora Score, MD 07/27/22 1655

## 2022-07-27 NOTE — ED Notes (Signed)
Pt spoon fed apple sauce in stretcher. Pt resting. Nad.

## 2022-07-27 NOTE — Discharge Instructions (Addendum)
You were seen in the emergency department after your fall.  Your workup showed no signs of injury from your fall and no obvious cause of your fall.  You can follow-up with your primary doctor to have your symptoms rechecked.  You should return to the emergency department if you have worsening headaches, repetitive vomiting or any other new or concerning symptoms.

## 2022-07-27 NOTE — ED Notes (Signed)
Called legal guardian and notified them of patient being here and sent by facility.  Spoke with [place].

## 2022-11-26 ENCOUNTER — Other Ambulatory Visit: Payer: Self-pay

## 2022-11-26 ENCOUNTER — Encounter (HOSPITAL_COMMUNITY): Payer: Self-pay

## 2022-11-26 ENCOUNTER — Emergency Department (HOSPITAL_COMMUNITY): Payer: Medicare (Managed Care)

## 2022-11-26 ENCOUNTER — Emergency Department (HOSPITAL_COMMUNITY)
Admission: EM | Admit: 2022-11-26 | Discharge: 2022-11-27 | Disposition: A | Discharge status: Home / Self Care | Payer: Medicare (Managed Care) | Attending: Emergency Medicine | Admitting: Emergency Medicine

## 2022-11-26 DIAGNOSIS — W1830XA Fall on same level, unspecified, initial encounter: Secondary | ICD-10-CM | POA: Diagnosis not present

## 2022-11-26 DIAGNOSIS — R1032 Left lower quadrant pain: Secondary | ICD-10-CM | POA: Diagnosis present

## 2022-11-26 DIAGNOSIS — Z7982 Long term (current) use of aspirin: Secondary | ICD-10-CM | POA: Insufficient documentation

## 2022-11-26 DIAGNOSIS — W19XXXA Unspecified fall, initial encounter: Secondary | ICD-10-CM

## 2022-11-26 MED ORDER — ACETAMINOPHEN 325 MG PO TABS
650.0000 mg | ORAL_TABLET | Freq: Once | ORAL | Status: AC
  Administered 2022-11-26: 650 mg via ORAL
  Filled 2022-11-26: qty 2

## 2022-11-26 NOTE — ED Notes (Signed)
Attempted to Call SNF x2 . [facility] has been notified

## 2022-11-26 NOTE — ED Provider Notes (Signed)
[place] EMERGENCY DEPARTMENT AT [facility] Provider Note   CSN: [id number] Arrival date & time: 11/26/22  1447     History  Chief Complaint  Patient presents with   Krisean Craver is a 62 y.o. male.  62 year old male presents from nursing home.  He is alert and oriented x 1 baseline.  Currently at his baseline.  History obtained from nursing home.  They state they were transferring him from wheelchair to bed when he had a ground-level fall.  Since then he has been complaining of left-sided abdominal pain.  No head injury.  Not on anticoagulation.  This was a witnessed fall.  The history is provided by the patient. No language interpreter was used.       Home Medications Prior to Admission medications   Medication Sig Start Date End Date Taking? Authorizing Provider  acetaminophen (TYLENOL) 325 MG tablet Take 650 mg by mouth 3 (three) times daily. 325 mg every 6 hours prn    [provider]  aspirin EC 81 MG tablet Take 81 mg by mouth every evening.    [provider]  cholecalciferol (VITAMIN D) 1000 units tablet Take 1,000 Units by mouth daily.    [provider]  clonazePAM (KLONOPIN) 0.5 MG tablet Take 0.5 mg by mouth at bedtime.    [provider]  clonazePAM (KLONOPIN) 1 MG tablet Take 1 mg by mouth every morning.    [provider]  Dextromethorphan-Quinidine (NUEDEXTA) 20-10 MG CAPS Take 1 tablet by mouth 2 (two) times daily.    [provider]  docusate sodium (COLACE) 100 MG capsule Take 1 capsule (100 mg total) by mouth 2 (two) times daily. 02/13/15   Alford Highland, MD  escitalopram (LEXAPRO) 20 MG tablet Take 20 mg by mouth daily.    [provider]  glycerin adult 2 g suppository Place 1 suppository rectally as needed for constipation. 07/26/22   Rondel Baton, MD  hydrocortisone (ANUSOL-HC) 25 MG suppository Place 25 mg rectally 2 (two) times daily as needed.     [provider]  memantine (NAMENDA XR) 14 MG CP24 24 hr capsule Take 14 mg by mouth daily.    [provider]  mirtazapine (REMERON) 15 MG tablet Take 15 mg by mouth every evening.     [provider]  oxyCODONE (OXY IR/ROXICODONE) 5 MG immediate release tablet Take one tablet by mouth before meals and at bedtime for pain 07/11/16   Reed, Tiffany L, DO  polyethylene glycol (MIRALAX / GLYCOLAX) packet Take 17 g by mouth daily.    [provider]  polyethylene glycol (MIRALAX) 17 g packet Take 17 g by mouth daily. 07/26/22   Rondel Baton, MD  risperiDONE (RISPERDAL) 1 MG tablet Take 1 mg by mouth 2 (two) times daily.     [provider]  rivastigmine (EXELON) 1.5 MG capsule Take 1.5 mg by mouth 2 (two) times daily.    [provider]      Allergies    Patient has no known allergies.    Review of Systems   Review of Systems  Unable to perform ROS: Dementia    Physical Exam Updated Vital Signs BP 116/70   Pulse 78   Resp (!) 22   Ht 5\' 7"  (1.702 m)   Wt 65.7 kg   SpO2 97%   BMI 22.69 kg/m  Physical Exam Vitals and nursing note reviewed.  Constitutional:  General: He is not in acute distress.    Appearance: Normal appearance. He is not ill-appearing.  HENT:     Head: Normocephalic and atraumatic.     Nose: Nose normal.  Eyes:     Conjunctiva/sclera: Conjunctivae normal.  Cardiovascular:     Rate and Rhythm: Normal rate and regular rhythm.  Pulmonary:     Effort: Pulmonary effort is normal. No respiratory distress.  Abdominal:     General: There is no distension.     Tenderness: There is no abdominal tenderness. There is no guarding.     Comments: No bruising to abdominal wall.  Musculoskeletal:        General: No deformity.  Skin:    Findings: No rash.  Neurological:     Mental Status: He is alert.     ED Results / Procedures / Treatments   Labs (all labs ordered are listed, but only abnormal results are  displayed) Labs Reviewed - No data to display  EKG None  Radiology No results found.  Procedures Procedures    Medications Ordered in ED Medications  acetaminophen (TYLENOL) tablet 650 mg (650 mg Oral Given 11/26/22 1659)    ED Course/ Medical Decision Making/ A&P                                 Medical Decision Making Amount and/or Complexity of Data Reviewed Radiology: ordered.  Risk OTC drugs.   62 year old male presents today after witnessed fall at nursing home.  Ground-level fall.  No head injury.  Not on anticoagulation.  According nursing home he is complaining of abdominal wall pain on the left side.  No bruising noted on exam.  Abdomen is soft without distention and without guarding.  Case discussed with attending.  Patient is a poor historian.  Abdominal exam is not impressive.  Will proceed with chest x-ray, pelvic x-ray.  Chest x-ray without acute cardiopulmonary process.  Pelvic x-ray demonstrates no acute bony abnormality.  Does show evidence of constipation.  Discussed need for aggressive bowel regimen.  Discussed with Gelene Mink his legal guardian.  Patient is appropriate for discharge.  They are in agreement.  Will discharge back to facility.  Final Clinical Impression(s) / ED Diagnoses Final diagnoses:  Fall, initial encounter    Rx / DC Orders ED Discharge Orders     None         Marita Kansas, PA-C 11/26/22 1849    Charlynne Pander, MD 11/26/22 [phone]

## 2022-11-26 NOTE — Discharge Instructions (Signed)
X-rays with no evidence of fracture or other concerning findings.  There is evidence of constipation on your x-ray.  I recommend aggressive bowel regiment with MiraLAX up to 2-3 capfuls a day as well as additional stool softener until you have the desired effect on your bowels.  For any concerning symptoms return to the emergency room for evaluation.

## 2022-11-26 NOTE — ED Notes (Addendum)
4th attempt made to give handoff to facility. Consulting civil engineer notified

## 2022-11-26 NOTE — ED Triage Notes (Addendum)
Pt BIB EMS from SNF for mechanical fall and possible abdominal injury.  No thinners & no apparent head injury. Pt has hx of cerebral palsy & alzheimer's presenting at baseline.

## 2022-11-26 NOTE — ED Notes (Signed)
Third attempt made to give report to facility.

## 2023-03-11 ENCOUNTER — Emergency Department (HOSPITAL_COMMUNITY)
Admission: EM | Admit: 2023-03-11 | Discharge: 2023-03-12 | Disposition: A | Discharge status: Home / Self Care | Payer: Medicare (Managed Care) | Attending: Emergency Medicine | Admitting: Emergency Medicine

## 2023-03-11 DIAGNOSIS — G309 Alzheimer's disease, unspecified: Secondary | ICD-10-CM | POA: Insufficient documentation

## 2023-03-11 DIAGNOSIS — J101 Influenza due to other identified influenza virus with other respiratory manifestations: Secondary | ICD-10-CM | POA: Diagnosis not present

## 2023-03-11 DIAGNOSIS — I10 Essential (primary) hypertension: Secondary | ICD-10-CM | POA: Insufficient documentation

## 2023-03-11 DIAGNOSIS — Z20822 Contact with and (suspected) exposure to covid-19: Secondary | ICD-10-CM | POA: Diagnosis not present

## 2023-03-11 DIAGNOSIS — Z79899 Other long term (current) drug therapy: Secondary | ICD-10-CM | POA: Diagnosis not present

## 2023-03-11 DIAGNOSIS — F0390 Unspecified dementia without behavioral disturbance: Secondary | ICD-10-CM | POA: Diagnosis not present

## 2023-03-11 DIAGNOSIS — R509 Fever, unspecified: Secondary | ICD-10-CM | POA: Diagnosis present

## 2023-03-11 DIAGNOSIS — Z7982 Long term (current) use of aspirin: Secondary | ICD-10-CM | POA: Insufficient documentation

## 2023-03-11 MED ORDER — IBUPROFEN 400 MG PO TABS
400.0000 mg | ORAL_TABLET | Freq: Once | ORAL | Status: AC
  Administered 2023-03-12: 400 mg via ORAL
  Filled 2023-03-11: qty 1

## 2023-03-11 MED ORDER — ACETAMINOPHEN 500 MG PO TABS: 500.0000 mg | ORAL_TABLET | Freq: Once | ORAL | Status: DC

## 2023-03-11 NOTE — ED Triage Notes (Signed)
 Pt to ED via [facility] from [facility] c/o flu sx x 2 days. Fever, cough. Orientation at baseline A&O X 2. Received tylenol  at facility at 2100 tonight.   No medications given by EMS.   Last VS: 100/60, P 82, 90%RA, RR18, CBG 118.

## 2023-03-11 NOTE — ED Provider Notes (Signed)
  EMERGENCY DEPARTMENT AT [facility] Provider Note  CSN: [id number] Arrival date & time: 03/11/23 2252  Chief Complaint(s) FLU SX  Pt to ED via [facility] from [facility] c/o flu sx x 2 days. Fever, cough. Orientation at baseline A&O X 2. Received tylenol  at facility at 2100 tonight.   No medications given by EMS.   Last VS: 100/60, P 82, 90%RA, RR18, CBG 118.   HPI Maurice Clayton is a 63 y.o. male here for fever and cough, concerning for influenza.  He comes from [facility]. Patient complains and cough.  Denies any other physical complaints.  No nausea vomiting.  No diarrhea.  The history is provided by the patient.    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 Authorizing Provider  oseltamivir  (TAMIFLU ) 75 MG capsule Take 1 capsule (75 mg total) by mouth every 12 (twelve) hours. 03/12/23  Yes Sundiata Ferrick, Raynell Moder, MD  acetaminophen  (TYLENOL ) 325 MG tablet Take 650 mg by mouth 3 (three) times daily. 325 mg every 6 hours prn    [provider]  aspirin  EC 81 MG tablet Take 81 mg by mouth  every evening.    [provider]  cholecalciferol (VITAMIN D) 1000 units tablet Take 1,000 Units by mouth daily.    [provider]  clonazePAM (KLONOPIN) 0.5 MG tablet Take 0.5 mg by mouth at bedtime.    [provider]  clonazePAM (KLONOPIN) 1 MG tablet Take 1 mg by mouth every morning.    [provider]  Dextromethorphan-Quinidine (NUEDEXTA) 20-10 MG CAPS Take 1 tablet by mouth 2 (two) times daily.    [provider]  docusate sodium  (COLACE) 100 MG capsule Take 1 capsule (100 mg total) by mouth 2 (two) times daily. 02/13/15   Josette Ade, MD  escitalopram  (LEXAPRO ) 20 MG tablet Take 20 mg by mouth daily.    [provider]  glycerin  adult 2 g suppository Place 1 suppository rectally as needed for constipation. 07/26/22   Yolande Lamar BROCKS, MD  hydrocortisone  (ANUSOL -HC) 25 MG suppository Place 25 mg rectally 2 (two) times daily as needed.     [provider]  memantine  (NAMENDA  XR) 14 MG CP24 24 hr capsule Take 14 mg by mouth daily.    [provider]  mirtazapine  (REMERON ) 15 MG tablet Take 15 mg by mouth every evening.     [provider]  oxyCODONE  (OXY IR/ROXICODONE ) 5 MG immediate release tablet Take one tablet by mouth before meals and at bedtime for pain 07/11/16   Reed, Tiffany L, DO  polyethylene  glycol (MIRALAX  / GLYCOLAX ) packet Take 17 g by mouth daily.    [provider]  polyethylene glycol (MIRALAX ) 17 g packet Take 17 g by mouth daily. 07/26/22   Yolande Lamar BROCKS, MD  risperiDONE  (RISPERDAL ) 1 MG tablet Take 1 mg by mouth 2 (two) times daily.     [provider]  rivastigmine  (EXELON ) 1.5 MG capsule Take 1.5 mg by mouth 2 (two) times daily.    [provider]                                                                                                                                    Allergies Patient has no known allergies.  Review of Systems Review of  Systems As noted in HPI  Physical Exam Vital Signs  I have reviewed the triage vital signs BP 105/70 (BP Location: Left Arm)   Pulse 86   Temp 99.8 F (37.7 C) (Oral)   Resp (!) 22   SpO2 95%   Physical Exam Vitals reviewed.  Constitutional:      General: He is not in acute distress.    Appearance: He is well-developed. He is not diaphoretic.  HENT:     Head: Normocephalic and atraumatic.     Right Ear: External ear normal.     Left Ear: External ear normal.     Nose: Nose normal.     Mouth/Throat:     Mouth: Mucous membranes are moist.  Eyes:     General: No scleral icterus.    Conjunctiva/sclera: Conjunctivae normal.  Neck:     Trachea: Phonation normal.  Cardiovascular:     Rate and Rhythm: Normal rate and regular rhythm.  Pulmonary:     Effort: Pulmonary effort is normal. No respiratory distress.     Breath sounds: No stridor. No rhonchi or rales.  Abdominal:     General: There is no distension.  Musculoskeletal:        General: Normal range of motion.     Cervical back: Normal range of motion.  Neurological:     Mental Status: He is alert and oriented to person, place, and time.  Psychiatric:        Behavior: Behavior normal.     ED Results and Treatments Labs (all labs ordered are listed, but only abnormal results are displayed) Labs Reviewed  RESP PANEL BY RT-PCR (RSV, FLU A&B, COVID)  [id number] - Abnormal; Notable for the following components:      Result Value   Influenza A by PCR POSITIVE (*)    All other components within normal limits  EKG  EKG Interpretation Date/Time:    Ventricular Rate:    PR Interval:    QRS Duration:    QT Interval:    QTC Calculation:   R Axis:      Text Interpretation:         Radiology DG Chest 2 View Result Date: 03/12/2023 CLINICAL DATA:  Cough and flu like symptoms. EXAM: CHEST - 2 VIEW  COMPARISON:  November 26, 2022 FINDINGS: The heart size and mediastinal contours are within normal limits. There is no evidence of an acute infiltrate, pleural effusion or pneumothorax. The visualized skeletal structures are unremarkable. IMPRESSION: No active cardiopulmonary disease. Electronically Signed   By: Suzen Dials M.D.   On: 03/12/2023 00:25    Medications Ordered in ED Medications  ibuprofen  (ADVIL ) tablet 400 mg (has no administration in time range)   Procedures Procedures  (including critical care time) Medical Decision Making / ED Course   Medical Decision Making Amount and/or Complexity of Data Reviewed Labs: ordered. Decision-making details documented in ED Course. Radiology: ordered and independent interpretation performed. Decision-making details documented in ED Course.  Risk OTC drugs. Prescription drug management.    63 y.o. male presents with flu-like symptoms for 2 days. adequate oral hydration. Rest of history as above.  Patient appears well. No signs of toxicity, patient is interactive. No hypoxia (95% on RA), tachypnea or other signs of respiratory distress. No sign of clinical dehydration. Lung exam clear. Rest of exam as above.  CXR without pneumonia. Viral panel: + Flu A  Most consistent with flu-like illness.  No evidence suggestive of pharyngitis, AOM, PNA, or meningitis.  Discussed symptomatic treatment with the patient and they will follow closely with their PCP.   Attempt to contact daughter, Rilla, by phone for update unsuccessful.      Final Clinical Impression(s) / ED Diagnoses Final diagnoses:  Influenza A   The patient appears reasonably screened and/or stabilized for discharge and I doubt any other medical condition or other [facility] requiring further screening, evaluation, or treatment in the ED at this time. I have discussed the findings, Dx and Tx plan with the patient/family who expressed understanding and agree(s) with the  plan. Discharge instructions discussed at length. The patient/family was given strict return precautions who verbalized understanding of the instructions. No further questions at time of discharge.  Disposition: Discharge  Condition: Good  ED Discharge Orders          Ordered    oseltamivir  (TAMIFLU ) 75 MG capsule  Every 12 hours        03/12/23 0112             Follow Up: Primary care provider  Call  to schedule an appointment for close follow up    This chart was dictated using voice recognition software.  Despite best efforts to proofread,  errors can occur which can change the documentation meaning.    Trine Raynell Moder, MD 03/12/23 [phone]

## 2023-03-12 ENCOUNTER — Emergency Department (HOSPITAL_COMMUNITY): Payer: Medicare (Managed Care)

## 2023-03-12 LAB — RESP PANEL BY RT-PCR (RSV, FLU A&B, COVID)  RVPGX2
Influenza A by PCR: POSITIVE — AB
Influenza B by PCR: NEGATIVE
Resp Syncytial Virus by PCR: NEGATIVE
SARS Coronavirus 2 by RT PCR: NEGATIVE

## 2023-03-12 MED ORDER — OSELTAMIVIR PHOSPHATE 75 MG PO CAPS: 75.0000 mg | ORAL_CAPSULE | Freq: Two times a day (BID) | ORAL | 0 refills | Status: AC

## 2023-03-12 NOTE — ED Notes (Signed)
 Pt leaving with [facility] to go back to [facility] at this time. NAD noted on departure.

## 2023-03-12 NOTE — ED Notes (Signed)
 [facility] called

## 2023-06-15 ENCOUNTER — Other Ambulatory Visit: Payer: Self-pay

## 2023-06-15 ENCOUNTER — Emergency Department (HOSPITAL_COMMUNITY)
Admission: EM | Admit: 2023-06-15 | Discharge: 2023-06-16 | Disposition: A | Discharge status: Home / Self Care | Payer: Medicare (Managed Care) | Attending: Emergency Medicine | Admitting: Emergency Medicine

## 2023-06-15 ENCOUNTER — Emergency Department (HOSPITAL_COMMUNITY): Payer: Medicare (Managed Care)

## 2023-06-15 DIAGNOSIS — M25551 Pain in right hip: Secondary | ICD-10-CM | POA: Insufficient documentation

## 2023-06-15 DIAGNOSIS — W1839XA Other fall on same level, initial encounter: Secondary | ICD-10-CM | POA: Insufficient documentation

## 2023-06-15 DIAGNOSIS — Z7982 Long term (current) use of aspirin: Secondary | ICD-10-CM | POA: Insufficient documentation

## 2023-06-15 DIAGNOSIS — F0390 Unspecified dementia without behavioral disturbance: Secondary | ICD-10-CM | POA: Diagnosis not present

## 2023-06-15 DIAGNOSIS — W19XXXA Unspecified fall, initial encounter: Secondary | ICD-10-CM

## 2023-06-15 DIAGNOSIS — G309 Alzheimer's disease, unspecified: Secondary | ICD-10-CM | POA: Diagnosis not present

## 2023-06-15 DIAGNOSIS — I10 Essential (primary) hypertension: Secondary | ICD-10-CM | POA: Insufficient documentation

## 2023-06-15 MED ORDER — LORAZEPAM 2 MG/ML IJ SOLN: 1.0000 mg | Freq: Once | INTRAMUSCULAR | Status: DC

## 2023-06-15 MED ORDER — LORAZEPAM 2 MG/ML IJ SOLN
1.0000 mg | Freq: Once | INTRAMUSCULAR | Status: AC
  Administered 2023-06-15: 1 mg via INTRAMUSCULAR
  Filled 2023-06-15: qty 1

## 2023-06-15 MED ORDER — ACETAMINOPHEN 500 MG PO TABS
1000.0000 mg | ORAL_TABLET | Freq: Once | ORAL | Status: AC
  Administered 2023-06-15: 1000 mg via ORAL
  Filled 2023-06-15: qty 2

## 2023-06-15 NOTE — Discharge Instructions (Signed)
 We evaluated Maurice Clayton for his fall.  His testing in the emergency department was negative.  We did not see any sign of any injury or fracture.  He should follow-up with his primary doctor.  He can receive those milligrams of Tylenol  Clayton 6 hours as needed for pain.

## 2023-06-15 NOTE — ED Triage Notes (Signed)
 Patient bib [facility] for fall  he was found by staff on floor from [facility]. Right hip pain and forehead pain patient is at baseline

## 2023-06-15 NOTE — ED Provider Notes (Signed)
 [place] EMERGENCY DEPARTMENT AT [facility] Provider Note  CSN: [id number] Arrival date & time: 06/15/23 1631  Chief Complaint(s) Fall  HPI Maurice Clayton is a 63 y.o. male history of cerebral palsy, wheelchair-bound, dementia, hypertension presenting to the emergency department after fall.  Patient was found by staff on the ground.  Patient reports that he has some pain to his head and pain to his right hip.  Denies any neck pain, back pain, chest pain, abdominal pain, left hip pain or left lower extremity pain, upper extremity pain.  Patient unable to provide circumstances of the fall.  History limited due to dementia.   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 Authorizing Provider  acetaminophen  (TYLENOL ) 325 MG tablet Take 650 mg by mouth 3 (three) times daily. 325 mg every 6 hours prn    [provider]  aspirin  EC 81 MG tablet Take 81 mg by mouth every evening.    [provider]  cholecalciferol (VITAMIN D) 1000 units tablet Take 1,000 Units by mouth daily.    [provider]  clonazePAM (KLONOPIN) 0.5 MG tablet Take 0.5 mg by mouth at bedtime.    [provider]  clonazePAM (KLONOPIN) 1 MG tablet Take 1 mg by mouth every morning.    [provider]  Dextromethorphan-Quinidine (NUEDEXTA) 20-10 MG CAPS Take 1 tablet by mouth 2 (two) times daily.    [provider]  docusate sodium  (COLACE) 100 MG capsule Take 1 capsule (100 mg total) by mouth 2 (two) times daily. 02/13/15   Verla Glaze, MD  escitalopram  (LEXAPRO ) 20 MG tablet Take 20 mg by mouth daily.    [provider]  glycerin  adult 2 g suppository Place 1 suppository rectally as needed for constipation. 07/26/22   Ninetta Basket, MD  hydrocortisone  (ANUSOL -HC) 25 MG suppository Place 25 mg rectally 2 (two) times daily as needed.     [provider]  memantine  (NAMENDA  XR) 14 MG CP24 24 hr capsule Take 14 mg by mouth daily.    [provider]  mirtazapine  (REMERON ) 15 MG tablet Take 15 mg by mouth every evening.     [provider]  oseltamivir  (TAMIFLU ) 75 MG capsule Take 1 capsule (75 mg total) by mouth every 12 (twelve) hours. 03/12/23   Lindle Rhea, MD  oxyCODONE  (OXY IR/ROXICODONE ) 5 MG immediate release tablet Take one tablet by mouth before meals and at bedtime for pain 07/11/16   Reed, Tiffany L, DO  polyethylene glycol (MIRALAX  / GLYCOLAX ) packet Take 17 g by mouth daily.  [provider]  polyethylene glycol (MIRALAX ) 17 g packet Take 17 g by mouth daily. 07/26/22   Ninetta Basket, MD  risperiDONE  (RISPERDAL ) 1 MG tablet Take 1 mg by mouth 2 (two) times daily.     [provider]  rivastigmine  (EXELON ) 1.5 MG capsule Take 1.5 mg by mouth 2 (two) times daily.    [provider]                                                                                                                                    Past Surgical History Past Surgical History:  Procedure Laterality Date   Dental procedure      foot surgeries     bilateral for cerebral palsy   TOTAL HIP ARTHROPLASTY Right 02/09/2015   Procedure: TOTAL HIP ARTHROPLASTY ANTERIOR APPROACH;  Surgeon: Molli Angelucci, MD;  Location: [facility] ORS;  Service: Orthopedics;  Laterality: Right;   Family History Family History  Problem Relation Age of Onset   Dementia Mother    CAD Brother    Aortic aneurysm Brother    Aortic aneurysm Father    Kidney cancer Neg Hx    Kidney disease Neg Hx    Prostate cancer Neg Hx     Social History Social History   Tobacco Use   Smoking status: Never   Smokeless tobacco: Never  Substance Use Topics   Alcohol use: No   Drug use: No   Allergies Patient has no known allergies.  Review of Systems Review of Systems  All other systems reviewed and are negative.   Physical Exam Vital Signs  I have reviewed the triage vital signs BP 108/70 (BP Location: Left Arm)   Pulse 66   Temp 98.3 F (36.8 C)   Resp 18   Wt 65 kg   SpO2 96%   BMI 22.44 kg/m  Physical Exam Vitals and nursing note reviewed.  Constitutional:      General: He is not in acute distress.    Appearance: Normal appearance.  HENT:     Head: Normocephalic and atraumatic.     Mouth/Throat:     Mouth: Mucous membranes are moist.  Eyes:     Conjunctiva/sclera: Conjunctivae normal.  Cardiovascular:     Rate and Rhythm: Normal rate and regular rhythm.  Pulmonary:     Effort: Pulmonary effort is normal. No respiratory distress.     Breath sounds: Normal breath sounds.  Abdominal:     General: Abdomen is flat.     Palpations: Abdomen is soft.     Tenderness: There is no abdominal tenderness.  Musculoskeletal:     Right lower leg: No edema.     Left lower leg: No edema.     Comments: No midline C, T, L-spine tenderness.  No chest wall tenderness or crepitus.  Moves all 4 extremities without limitation to range of motion.  Bilateral upper extremities atraumatic without deformity  or focal tenderness.  Bilateral lower  extremities also without focal tenderness or deformity.  Patient specifically complains of right hip pain but range of motion of right hip is intact with no focal appreciable abnormality.  Skin:    General: Skin is warm and dry.     Capillary Refill: Capillary refill takes less than 2 seconds.  Neurological:     Mental Status: He is alert. Mental status is at baseline.     Comments: Oriented to self and knows he is in the hospital, knows he fell, moves all 4 extremities equally, no cranial nerve deficit  Psychiatric:        Mood and Affect: Mood normal.        Behavior: Behavior normal.     ED Results and Treatments Labs (all labs ordered are listed, but only abnormal results are displayed) Labs Reviewed - No data to display                                                                                                                        Radiology CT PELVIS WO CONTRAST Result Date: 06/15/2023 CLINICAL DATA:  Marvell Slider, nondiagnostic right hip x-ray EXAM: CT PELVIS WITHOUT CONTRAST TECHNIQUE: Multidetector CT imaging of the pelvis was performed following the standard protocol without intravenous contrast. RADIATION DOSE REDUCTION: This exam was performed according to the departmental dose-optimization program which includes automated exposure control, adjustment of the mA and/or kV according to patient size and/or use of iterative reconstruction technique. COMPARISON:  06/15/2023, 02/06/2015 FINDINGS: Urinary Tract: Bladder is decompressed, limiting its evaluation. Distal ureters are unremarkable. Bowel: Marked fecal retention within the rectal vault consistent with fecal impaction. No evidence of bowel obstruction. Diverticulosis of the descending and sigmoid colon without diverticulitis. Normal appendix right lower quadrant. Vascular/Lymphatic: Atherosclerosis of the aorta and its branches. No pathologic adenopathy. Reproductive:  Prostate is not enlarged. Other: No free fluid or free  intraperitoneal gas. No abdominal wall hernia. Musculoskeletal: Limited evaluation of the right hip and pelvis due to streak artifact from indwelling right hip arthroplasty. Normal alignment of the right hip prosthesis without signs of fracture or dislocation. Severe left hip osteoarthritis. The remainder of the bony pelvis is unremarkable. Reconstructed images demonstrate no additional findings. IMPRESSION: 1. Unremarkable right hip arthroplasty. No evidence of fracture or dislocation. 2. Large amount of retained stool in the rectal vault consistent with fecal impaction. 3. Distal colonic diverticulosis without diverticulitis. 4. Severe left hip osteoarthritis. 5.  Aortic Atherosclerosis (ICD10-I70.0). Electronically Signed   By: Bobbye Burrow M.D.   On: 06/15/2023 19:54   CT Cervical Spine Wo Contrast Result Date: 06/15/2023 CLINICAL DATA:  Neck trauma (Age >= 65y). EXAM: CT CERVICAL SPINE WITHOUT CONTRAST TECHNIQUE: Multidetector CT imaging of the cervical spine was performed without intravenous contrast. Multiplanar CT image reconstructions were also generated. RADIATION DOSE REDUCTION: This exam was performed according to the departmental dose-optimization program which includes automated exposure control, adjustment of the mA and/or kV according to patient size and/or  use of iterative reconstruction technique. COMPARISON:  07/26/2022 FINDINGS: Alignment: Image quality is degraded by patient motion. Slight degenerative anterolisthesis of C4 on C5. Skull base and vertebrae: Limited study by motion. No visible fracture. Scattered sclerotic areas noted in the C5 vertebral body and the pedicles bilaterally at C6 and C7 and in the left T1 pedicle. These are unchanged since prior study. Soft tissues and spinal canal: Negative Disc levels:  Diffuse degenerative disc disease and facet disease. Upper chest: No acute findings Other: None IMPRESSION: Limited study due to patient motion. No visible acute bony  abnormality. Several stable sclerotic foci, likely bone islands in the absence of cancer history. Electronically Signed   By: Janeece Mechanic M.D.   On: 06/15/2023 19:37   CT Head Wo Contrast Result Date: 06/15/2023 CLINICAL DATA:  Head trauma, minor (Age >= 65y) EXAM: CT HEAD WITHOUT CONTRAST TECHNIQUE: Contiguous axial images were obtained from the base of the skull through the vertex without intravenous contrast. RADIATION DOSE REDUCTION: This exam was performed according to the departmental dose-optimization program which includes automated exposure control, adjustment of the mA and/or kV according to patient size and/or use of iterative reconstruction technique. COMPARISON:  07/27/2018 T4 FINDINGS: Brain: Posterior fossa arachnoid cyst again noted. No mass effect or midline shift. Again noted are hyperdense foci in the parietal lobes bilaterally, unchanged from prior exam consistent with calcifications. No hemorrhage or acute infarct. No hydrocephalus. Vascular: No hyperdense vessel or unexpected calcification. Skull: No acute calvarial abnormality. Sinuses/Orbits: No acute findings Other: None IMPRESSION: No acute intracranial abnormality. Electronically Signed   By: Janeece Mechanic M.D.   On: 06/15/2023 19:32   DG Hip Unilat W or Wo Pelvis 2-3 Views Right Result Date: 06/15/2023 CLINICAL DATA:  Right hip pain following a fall. EXAM: DG HIP (WITH OR WITHOUT PELVIS) 2-3V RIGHT COMPARISON:  02/09/2015 FINDINGS: Standard views can not be obtained due to inability of the patient to cooperate for positioning. These images are not adequate for assessing for fracture or dislocation. A large amount of stool is demonstrated in the included portions of the colon, including the rectum. IMPRESSION: 1. Nondiagnostic study for assessing for fracture or dislocation. The examination will either need to be repeated with patient's sedation or a pelvis/right hip CT without contrast with sedation. 2. Large amount of stool in the  included portions of the colon, including the rectum. Electronically Signed   By: Catherin Closs M.D.   On: 06/15/2023 17:44    Pertinent labs & imaging results that were available during my care of the patient were reviewed by me and considered in my medical decision making (see MDM for details).  Medications Ordered in ED Medications  acetaminophen  (TYLENOL ) tablet 1,000 mg (1,000 mg Oral Given 06/15/23 1729)  LORazepam  (ATIVAN ) injection 1 mg (1 mg Intramuscular Given 06/15/23 1818)  Procedures Procedures  (including critical care time)  Medical Decision Making / ED Course   MDM:  63 year old with history of dementia presenting after unwitnessed fall.  Per EMS patient at baseline.  On chart review does seem at his baseline.  Patient is oriented to self and knows he is in the hospital for fall.  He reports pain in the hip and headache.  Will obtain CT head, CT cervical spine, x-ray of the right hip.  If workup is negative anticipate discharge.  Will give some Tylenol  for pain.  Clinical Course as of 06/15/23 2010  Thu Jun 15, 2023  2009 Imaging negative, no sign of any injury or fracture.  Discussed with patient's legal guardian Mr. Arnoldo Lapping.  Will send patient back to his facility.  X-ray does show some chronic constipation but no acute finding. [WS]    Clinical Course User Index [WS] Mordecai Applebaum, MD     Additional history obtained: -Additional history obtained from ems   Imaging Studies ordered: I ordered imaging studies including CT head and neck, CT pelvis  On my interpretation imaging demonstrates no fracture  I independently visualized and interpreted imaging. I agree with the radiologist interpretation   Medicines ordered and prescription drug management: Meds ordered this encounter  Medications   acetaminophen  (TYLENOL ) tablet  1,000 mg   DISCONTD: LORazepam  (ATIVAN ) injection 1 mg   LORazepam  (ATIVAN ) injection 1 mg    -I have reviewed the patients home medicines and have made adjustments as needed  Reevaluation: After the interventions noted above, I reevaluated the patient and found that their symptoms have improved  Co morbidities that complicate the patient evaluation  Past Medical History:  Diagnosis Date   Cerebral palsy (HCC)    bed bound status   Dementia (HCC)    Depression    HTN (hypertension)    Hyperthyroidism    Slow transit constipation 03/27/2015   Vitamin B 12 deficiency       Dispostion: Disposition decision including need for hospitalization was considered, and patient discharged from emergency department.    Final Clinical Impression(s) / ED Diagnoses Final diagnoses:  Fall, initial encounter     This chart was dictated using voice recognition software.  Despite best efforts to proofread,  errors can occur which can change the documentation meaning.    Mordecai Applebaum, MD 06/15/23 2010

## 2023-06-15 NOTE — ED Notes (Addendum)
 This RN attempted to call Pt's facility [facility] with no answer from staffing. This RN then called Pt's legal guardian Frederick(Brother) @[phone] and gave him an update and made aware that Pt would be returning back to facility.

## 2023-07-11 ENCOUNTER — Emergency Department (HOSPITAL_COMMUNITY)
Admission: EM | Admit: 2023-07-11 | Discharge: 2023-07-11 | Disposition: A | Discharge status: Home / Self Care | Payer: Medicare (Managed Care) | Attending: Emergency Medicine | Admitting: Emergency Medicine

## 2023-07-11 ENCOUNTER — Other Ambulatory Visit: Payer: Self-pay

## 2023-07-11 ENCOUNTER — Emergency Department (HOSPITAL_COMMUNITY): Payer: Medicare (Managed Care)

## 2023-07-11 DIAGNOSIS — Z7982 Long term (current) use of aspirin: Secondary | ICD-10-CM | POA: Insufficient documentation

## 2023-07-11 DIAGNOSIS — I10 Essential (primary) hypertension: Secondary | ICD-10-CM | POA: Insufficient documentation

## 2023-07-11 DIAGNOSIS — S0101XA Laceration without foreign body of scalp, initial encounter: Secondary | ICD-10-CM

## 2023-07-11 DIAGNOSIS — F0390 Unspecified dementia without behavioral disturbance: Secondary | ICD-10-CM | POA: Diagnosis not present

## 2023-07-11 DIAGNOSIS — W19XXXA Unspecified fall, initial encounter: Secondary | ICD-10-CM

## 2023-07-11 DIAGNOSIS — Z79899 Other long term (current) drug therapy: Secondary | ICD-10-CM | POA: Diagnosis not present

## 2023-07-11 DIAGNOSIS — E039 Hypothyroidism, unspecified: Secondary | ICD-10-CM | POA: Insufficient documentation

## 2023-07-11 DIAGNOSIS — W06XXXA Fall from bed, initial encounter: Secondary | ICD-10-CM | POA: Insufficient documentation

## 2023-07-11 MED ORDER — LORAZEPAM 1 MG PO TABS
1.0000 mg | ORAL_TABLET | Freq: Once | ORAL | Status: AC
Start: 2023-07-11 — End: 2023-07-11
  Administered 2023-07-11: 1 mg via ORAL
  Filled 2023-07-11: qty 1

## 2023-07-11 MED ORDER — ACETAMINOPHEN 500 MG PO TABS
1000.0000 mg | ORAL_TABLET | Freq: Once | ORAL | Status: AC
  Administered 2023-07-11: 1000 mg via ORAL
  Filled 2023-07-11: qty 2

## 2023-07-11 NOTE — ED Notes (Signed)
 Attempted to contact facility x 2.

## 2023-07-11 NOTE — ED Triage Notes (Signed)
 BIB EMS From [facility] after being found on the floor. No thinners. Doesn't remember event. At baseline mental status. Laceration to the right side of his head. Denies pain. EMS attempted C collar but pt wouldn't wear it.

## 2023-07-11 NOTE — ED Provider Notes (Signed)
 [place] EMERGENCY DEPARTMENT AT [facility] Provider Note   CSN: [id number] Arrival date & time: 07/11/23  1614     History  Chief Complaint  Patient presents with   Maurice Clayton is a 63 y.o. male.  Patient is a 63 year old male with a history of cerebral palsy, dementia, hypertension, hypothyroidism, schizophrenia and constipation who is presenting today from his long-term care facility after an unwitnessed fall.  They reported that patient was on the floor next to his bed.  Facility reports that he had a similar event about a month ago.  He is at his mental baseline.  Speaking with the patient he complains of a headache where he hit his head on the floor but otherwise has no other complaints.  He denies neck pain, nausea vomiting.  No abdominal pain.  He does indicate that he is non-ambulatory and is either in the bed or wheelchair.  He reports that he has otherwise been feeling fine and the facility did not indicate there has been any new changes.  The history is provided by the EMS personnel, the patient, the nursing home and medical records.  Fall       Home Medications Prior to Admission medications   Medication Sig Start Date End Date Taking? Authorizing Provider  acetaminophen  (TYLENOL ) 325 MG tablet Take 650 mg by mouth 3 (three) times daily. 325 mg every 6 hours prn    [provider]  aspirin  EC 81 MG tablet Take 81 mg by mouth every evening.    [provider]  cholecalciferol (VITAMIN D) 1000 units tablet Take 1,000 Units by mouth daily.    [provider]  clonazePAM (KLONOPIN) 0.5 MG tablet Take 0.5 mg by mouth at bedtime.    [provider]  clonazePAM (KLONOPIN) 1 MG tablet Take 1 mg by mouth every morning.    [provider]  Dextromethorphan-Quinidine (NUEDEXTA) 20-10 MG CAPS Take 1 tablet by mouth 2 (two) times daily.    [provider]  docusate sodium  (COLACE) 100 MG capsule Take 1  capsule (100 mg total) by mouth 2 (two) times daily. 02/13/15   Verla Glaze, MD  escitalopram  (LEXAPRO ) 20 MG tablet Take 20 mg by mouth daily.    [provider]  glycerin  adult 2 g suppository Place 1 suppository rectally as needed for constipation. 07/26/22   Ninetta Basket, MD  hydrocortisone  (ANUSOL -HC) 25 MG suppository Place 25 mg rectally 2 (two) times daily as needed.     [provider]  memantine  (NAMENDA  XR) 14 MG CP24 24 hr capsule Take 14 mg by mouth daily.    [provider]  mirtazapine  (REMERON ) 15 MG tablet Take 15 mg by mouth every evening.     [provider]  oseltamivir  (TAMIFLU ) 75 MG capsule Take 1 capsule (75 mg total) by mouth every 12 (twelve) hours. 03/12/23   Lindle Rhea, MD  oxyCODONE  (OXY IR/ROXICODONE ) 5 MG immediate release tablet Take one tablet by mouth before meals and at bedtime for pain 07/11/16   Reed, Tiffany L, DO  polyethylene glycol (MIRALAX  / GLYCOLAX ) packet Take 17 g by mouth daily.    [provider]  polyethylene glycol (MIRALAX ) 17 g packet Take 17 g by mouth daily. 07/26/22   Ninetta Basket, MD  risperiDONE  (RISPERDAL ) 1 MG tablet Take 1 mg by mouth 2 (two) times daily.     [provider]  rivastigmine  (EXELON ) 1.5 MG capsule  Take 1.5 mg by mouth 2 (two) times daily.    [provider]      Allergies    Patient has no known allergies.    Review of Systems   Review of Systems  Physical Exam Updated Vital Signs BP 113/65   Pulse 63   Temp 98.4 F (36.9 C)   Resp 14   Ht 5\' 7"  (1.702 m)   Wt 65 kg   SpO2 96%   BMI 22.44 kg/m  Physical Exam Vitals and nursing note reviewed.  Constitutional:      General: He is not in acute distress.    Appearance: He is well-developed.  HENT:     Head: Normocephalic. Contusion present.      Comments: 0.5 cm laceration with surrounding hematoma    Mouth/Throat:     Mouth: Mucous membranes are dry.  Eyes:      Conjunctiva/sclera: Conjunctivae normal.     Pupils: Pupils are equal, round, and reactive to light.  Cardiovascular:     Rate and Rhythm: Normal rate and regular rhythm.     Heart sounds: No murmur heard. Pulmonary:     Effort: Pulmonary effort is normal. No respiratory distress.     Breath sounds: Normal breath sounds. No wheezing or rales.  Abdominal:     General: There is no distension.     Palpations: Abdomen is soft.     Tenderness: There is no abdominal tenderness. There is no guarding or rebound.  Musculoskeletal:        General: No tenderness. Normal range of motion.     Cervical back: Normal range of motion and neck supple. No tenderness.     Right lower leg: No edema.     Left lower leg: No edema.     Comments: Ranged all major joints including shoulders, elbows, wrists, hips, knees and ankles bilaterally without any pain  Skin:    General: Skin is warm and dry.     Findings: No erythema or rash.  Neurological:     Mental Status: He is alert and oriented to person, place, and time. Mental status is at baseline.     Comments: Mild contractures noted in the lower extremities.  Able to move upper extremities without difficulty  Psychiatric:        Mood and Affect: Mood normal.        Behavior: Behavior normal.     ED Results / Procedures / Treatments   Labs (all labs ordered are listed, but only abnormal results are displayed) Labs Reviewed - No data to display  EKG None  Radiology CT Head Wo Contrast Result Date: 07/11/2023 CLINICAL DATA:  Provided history: Head trauma, minor. EXAM: CT HEAD WITHOUT CONTRAST TECHNIQUE: Contiguous axial images were obtained from the base of the skull through the vertex without intravenous contrast. RADIATION DOSE REDUCTION: This exam was performed according to the departmental dose-optimization program which includes automated exposure control, adjustment of the mA and/or kV according to patient size and/or use of iterative  reconstruction technique. COMPARISON:  Prior head CT examinations 06/15/2023 and earlier. FINDINGS: Brain: Small nonspecific parenchymal calcifications again demonstrated within the left frontal, right parietooccipital and left temporal lobes. Mega cisterna magna. Mild generalized cerebellar atrophy. There is no acute intracranial hemorrhage. No demarcated cortical infarct. No extra-axial fluid collection. No evidence of an intracranial mass. No midline shift. Vascular: No hyperdense vessel. Atherosclerotic calcifications. Skull: No calvarial fracture or aggressive osseous lesion. Sinuses/Orbits: No mass or acute finding within the  imaged orbits. Mild mucosal thickening within the bilateral maxillary and right frontal sinuses. Other: Small bilateral mastoid effusions. IMPRESSION: 1.  No evidence of an acute intracranial abnormality. 2. Mild cerebellar atrophy. 3. Mild mucosal thickening within the right frontal and bilateral maxillary sinuses. 4. Small bilateral mastoid effusions. Electronically Signed   By: Bascom Lily D.O.   On: 07/11/2023 17:19    Procedures Procedures    Medications Ordered in ED Medications  acetaminophen  (TYLENOL ) tablet 1,000 mg (1,000 mg Oral Given 07/11/23 1706)    ED Course/ Medical Decision Making/ A&P                                 Medical Decision Making Amount and/or Complexity of Data Reviewed Radiology: ordered and independent interpretation performed. Decision-making details documented in ED Course.  Risk OTC drugs.   Pt with multiple medical problems and comorbidities and presenting today with a complaint that caries a high risk for morbidity and mortality.  Here today after a fall out of his bed and head contusion.  Last tetanus shot was in 2022 and he is up-to-date.  Vital signs are normal and patient is well-appearing with low suspicion of other underlying etiology.  CT of the head to rule out acute abnormalities.  Patient does not take any  anticoagulation.  Fell from bed height  5:44 PM I have independently visualized and interpreted pt's images today.  CT today negative for acute bleed.  Radiology reported no evidence of acute intracranial abnormality.  Patient does have mild cerebellar atrophy and mucosal thickening in the sinuses.  At this time patient appears stable for discharge.  Attempted to contact patient's guardian but there was no answer.  Will discuss discussed care at [facility].  Patient does have a very small laceration to his scalp which will heal by secondary intention.         Final Clinical Impression(s) / ED Diagnoses Final diagnoses:  Fall, initial encounter  Laceration of scalp, initial encounter    Rx / DC Orders ED Discharge Orders     None         Almond Army, MD 07/11/23 1744

## 2023-07-11 NOTE — ED Notes (Addendum)
 Alert, NAD, calm, interactive. Denies pain, questions, needs, sx. SR up x2. CBIR. Bed alarm activated. Non skid socks applied. Fall rtisk sign on door. Door open. Pt discussed in pod huddle.

## 2023-07-11 NOTE — ED Notes (Signed)
 Pt to and from CT

## 2023-07-11 NOTE — ED Notes (Signed)
 Ptar called, 7th in line

## 2023-07-11 NOTE — Discharge Instructions (Signed)
 Everything with the CAT scan looks normal without any sign of bleeding.  The small laceration will heal on its own.  You can place some Vaseline over the wound and just be careful when washing your hair.  If you develop vomiting, confusion or other concerns return to the emergency room.

## 2023-07-11 NOTE — ED Notes (Signed)
 Bed alarm placed on the bed. Bed in low lying/locked position with side rails up.

## 2023-07-11 NOTE — ED Notes (Signed)
 Report given to Sam Creighton, Char[employer] fundraiser.  Pt to be transported over.  Will continue to attempt to call [facility] to give report.

## 2023-10-28 ENCOUNTER — Emergency Department (HOSPITAL_COMMUNITY)
Admission: EM | Admit: 2023-10-28 | Discharge: 2023-10-28 | Disposition: A | Discharge status: Skilled Nursing Facility | Payer: Medicare (Managed Care)

## 2023-10-28 ENCOUNTER — Other Ambulatory Visit: Payer: Self-pay

## 2023-10-28 ENCOUNTER — Emergency Department (HOSPITAL_COMMUNITY): Payer: Medicare (Managed Care)

## 2023-10-28 ENCOUNTER — Encounter (HOSPITAL_COMMUNITY): Payer: Self-pay | Admitting: Pharmacy Technician

## 2023-10-28 DIAGNOSIS — I10 Essential (primary) hypertension: Secondary | ICD-10-CM | POA: Insufficient documentation

## 2023-10-28 DIAGNOSIS — F0390 Unspecified dementia without behavioral disturbance: Secondary | ICD-10-CM | POA: Insufficient documentation

## 2023-10-28 DIAGNOSIS — Z79899 Other long term (current) drug therapy: Secondary | ICD-10-CM | POA: Diagnosis not present

## 2023-10-28 DIAGNOSIS — S0990XA Unspecified injury of head, initial encounter: Secondary | ICD-10-CM | POA: Diagnosis present

## 2023-10-28 DIAGNOSIS — W01198A Fall on same level from slipping, tripping and stumbling with subsequent striking against other object, initial encounter: Secondary | ICD-10-CM | POA: Diagnosis not present

## 2023-10-28 DIAGNOSIS — E039 Hypothyroidism, unspecified: Secondary | ICD-10-CM | POA: Insufficient documentation

## 2023-10-28 DIAGNOSIS — Z7982 Long term (current) use of aspirin: Secondary | ICD-10-CM | POA: Insufficient documentation

## 2023-10-28 DIAGNOSIS — W19XXXA Unspecified fall, initial encounter: Secondary | ICD-10-CM

## 2023-10-28 LAB — URINALYSIS, ROUTINE W REFLEX MICROSCOPIC
Bacteria, UA: NONE SEEN
Bilirubin Urine: NEGATIVE
Glucose, UA: NEGATIVE mg/dL
Ketones, ur: NEGATIVE mg/dL
Leukocytes,Ua: NEGATIVE
Nitrite: NEGATIVE
Protein, ur: NEGATIVE mg/dL
RBC / HPF: >50 RBC/hpf (ref 0–5)
Specific Gravity, Urine: 1.018 (ref 1.005–1.030)
pH: 7 (ref 5.0–8.0)

## 2023-10-28 LAB — COMPREHENSIVE METABOLIC PANEL WITH GFR
ALT: 16 U/L (ref 0–44)
AST: 21 U/L (ref 15–41)
Albumin: 3.6 g/dL (ref 3.5–5.0)
Alkaline Phosphatase: 82 U/L (ref 38–126)
Anion gap: 8 (ref 5–15)
BUN: 14 mg/dL (ref 8–23)
CO2: 26 mmol/L (ref 22–32)
Calcium: 8.9 mg/dL (ref 8.9–10.3)
Chloride: 104 mmol/L (ref 98–111)
Creatinine, Ser: 1.17 mg/dL (ref 0.61–1.24)
GFR, Estimated: >60 mL/min (ref >60)
Glucose, Bld: 107 mg/dL — ABNORMAL HIGH (ref 70–99)
Potassium: 3.8 mmol/L (ref 3.5–5.1)
Sodium: 138 mmol/L (ref 135–145)
Total Bilirubin: 0.5 mg/dL (ref 0.0–1.2)
Total Protein: 6.2 g/dL — ABNORMAL LOW (ref 6.5–8.1)

## 2023-10-28 LAB — CBC
HCT: 45.9 % (ref 39.0–52.0)
Hemoglobin: 14.6 g/dL (ref 13.0–17.0)
MCH: 30.4 pg (ref 26.0–34.0)
MCHC: 31.8 g/dL (ref 30.0–36.0)
MCV: 95.4 fL (ref 80.0–100.0)
Platelets: 182 K/uL (ref 150–400)
RBC: 4.81 MIL/uL (ref 4.22–5.81)
RDW: 12.5 % (ref 11.5–15.5)
WBC: 5.3 K/uL (ref 4.0–10.5)
nRBC: 0 % (ref 0.0–0.2)

## 2023-10-28 LAB — CK: Total CK: 92 U/L (ref 49–397)

## 2023-10-28 NOTE — ED Notes (Signed)
 This nurse attempted to call legal guardians at number listed in the chart. Neither guardian answered, will attempt again at a later time.

## 2023-10-28 NOTE — ED Notes (Signed)
 This nurse attempted to call [facility] regarding patient's discharge and transport back to facility. First attempt unsuccessful, will attempt at a later time.

## 2023-10-28 NOTE — ED Notes (Signed)
 [facility] called

## 2023-10-28 NOTE — ED Notes (Signed)
 Patient transported to CT

## 2023-10-28 NOTE — ED Notes (Signed)
 This nurse attempted to call [facility] twice more regarding patient's discharge back to facility. Neither attempt was successful.

## 2023-10-28 NOTE — ED Provider Notes (Signed)
 [facility] EMERGENCY DEPARTMENT AT [facility] Provider Note   CSN: [id number] Arrival date & time: 10/28/23  1458     Patient presents with: Maurice Clayton is a 63 y.o. male.   Fall  Patient is a 63 year old male with PMH of cerebral palsy, dementia, HTN, hypothyroidism, and schizophrenia presenting to the ED via EMS from SNF/[facility] for a fall -patient disoriented at baseline and poor historian, therefore unable to corroborate further history.  Patient shakes head no with questioned regarding any acute somatic complaints at this time.  [facility] contacted for collateral, spoke with RN who reports the patient was found down on the ground this morning with unknown downtime and that anytime the patient falls  they must go to the ED.  She denies knowledge of any any focal injuries or recent infectious symptoms or behavioral changes..  Prior to Admission medications   Medication Sig Start Date End Date Taking? Authorizing Provider  acetaminophen  (TYLENOL ) 325 MG tablet Take 650 mg by mouth 3 (three) times daily. 325 mg every 6 hours prn    [provider]  aspirin  EC 81 MG tablet Take 81 mg by mouth every evening.    [provider]  cholecalciferol (VITAMIN D) 1000 units tablet Take 1,000 Units by mouth daily.    [provider]  clonazePAM (KLONOPIN) 0.5 MG tablet Take 0.5 mg by mouth at bedtime.    [provider]  clonazePAM (KLONOPIN) 1 MG tablet Take 1 mg by mouth every morning.    [provider]  Dextromethorphan-Quinidine (NUEDEXTA) 20-10 MG CAPS Take 1 tablet by mouth 2 (two) times daily.    [provider]  docusate sodium  (COLACE) 100 MG capsule Take 1 capsule (100 mg total) by mouth 2 (two) times daily. 02/13/15   Josette Ade, MD  escitalopram  (LEXAPRO ) 20 MG tablet Take 20 mg by mouth daily.    [provider]  glycerin  adult 2 g suppository Place 1 suppository rectally as needed for  constipation. 07/26/22   Yolande Lamar BROCKS, MD  hydrocortisone  (ANUSOL -HC) 25 MG suppository Place 25 mg rectally 2 (two) times daily as needed.     [provider]  memantine  (NAMENDA  XR) 14 MG CP24 24 hr capsule Take 14 mg by mouth daily.    [provider]  mirtazapine  (REMERON ) 15 MG tablet Take 15 mg by mouth every evening.     [provider]  oseltamivir  (TAMIFLU ) 75 MG capsule Take 1 capsule (75 mg total) by mouth every 12 (twelve) hours. 03/12/23   Trine Raynell Moder, MD  oxyCODONE  (OXY IR/ROXICODONE ) 5 MG immediate release tablet Take one tablet by mouth before meals and at bedtime for pain 07/11/16   Reed, Tiffany L, DO  polyethylene glycol (MIRALAX  / GLYCOLAX ) packet Take 17 g by mouth daily.    [provider]  polyethylene glycol (MIRALAX ) 17 g packet Take 17 g by mouth daily. 07/26/22   Yolande Lamar BROCKS, MD  risperiDONE  (RISPERDAL ) 1 MG tablet Take 1 mg by mouth 2 (two) times daily.     [provider]  rivastigmine  (EXELON ) 1.5 MG capsule Take 1.5 mg by mouth 2 (two) times daily.    [provider]    Allergies: Patient has no known allergies.    Review of Systems  Updated Vital Signs BP 122/86   Pulse 81   Temp 97.6 F (36.4 C)   Resp 18   SpO2 97%   Physical Exam  Constitutional:      Appearance: Normal appearance.  HENT:     Head: Normocephalic and atraumatic.     Right Ear: Ear canal and external ear normal.     Left Ear: Ear canal and external ear normal.     Mouth/Throat:     Mouth: Mucous membranes are moist.     Pharynx: Oropharynx is clear.  Eyes:     Extraocular Movements: Extraocular movements intact.     Pupils: Pupils are equal, round, and reactive to light.  Cardiovascular:     Rate and Rhythm: Normal rate and regular rhythm.     Pulses: Normal pulses.     Heart sounds: Normal heart sounds.  Pulmonary:     Effort: Pulmonary effort is normal.     Breath sounds: Normal breath sounds.   Abdominal:     General: Abdomen is flat.     Palpations: Abdomen is soft.  Musculoskeletal:     Cervical back: Normal range of motion.     Comments: Baseline spasticity in upper and lower extremities with no discomfort inducible with passive ROM of bilateral upper and lower extremities  No anterior chest wall or lateral chest wall discomfort to palpation  Skin:    General: Skin is warm and dry.     Capillary Refill: Capillary refill takes less than 2 seconds.  Neurological:     Mental Status: He is alert. Mental status is at baseline.     Motor: No weakness.     (all labs ordered are listed, but only abnormal results are displayed) Labs Reviewed  COMPREHENSIVE METABOLIC PANEL WITH GFR - Abnormal; Notable for the following components:      Result Value   Glucose, Bld 107 (*)    Total Protein 6.2 (*)    All other components within normal limits  URINALYSIS, ROUTINE W REFLEX MICROSCOPIC - Abnormal; Notable for the following components:   APPearance CLOUDY (*)    Hgb urine dipstick MODERATE (*)    All other components within normal limits  CBC  CK    EKG: None  Radiology: CT Cervical Spine Wo Contrast Result Date: 10/28/2023 CLINICAL DATA:  Clemens, hit head EXAM: CT CERVICAL SPINE WITHOUT CONTRAST TECHNIQUE: Multidetector CT imaging of the cervical spine was performed without intravenous contrast. Multiplanar CT image reconstructions were also generated. RADIATION DOSE REDUCTION: This exam was performed according to the departmental dose-optimization program which includes automated exposure control, adjustment of the mA and/or kV according to patient size and/or use of iterative reconstruction technique. COMPARISON:  06/15/2023 FINDINGS: Alignment: Exaggerated cervical lordosis centered at the C4 level, stable. There is stable mild degenerative anterolisthesis of C4 relative to C3 and C5. Skull base and vertebrae: No acute fracture. No primary bone lesion or focal pathologic  process. Soft tissues and spinal canal: No prevertebral fluid or swelling. No visible canal hematoma. Disc levels: Stable multilevel spondylosis from C3-4 through C6-7. Mild diffuse facet hypertrophy unchanged, greatest at the C4-5 level. Upper chest: Airway is patent.  Lung apices are clear. Other: Reconstructed images demonstrate no additional findings. IMPRESSION: 1. No acute cervical spine fracture. 2. Stable multilevel cervical degenerative changes. Electronically Signed   By: Ozell Daring M.D.   On: 10/28/2023 18:28   CT Head Wo Contrast Result Date: 10/28/2023 CLINICAL DATA:  Clemens, hit head EXAM: CT HEAD WITHOUT CONTRAST TECHNIQUE: Contiguous axial images were obtained from the base of the skull through the vertex without intravenous contrast. RADIATION DOSE REDUCTION: This exam was performed according to  the departmental dose-optimization program which includes automated exposure control, adjustment of the mA and/or kV according to patient size and/or use of iterative reconstruction technique. COMPARISON:  07/11/2023 FINDINGS: Brain: No acute infarct or hemorrhage. Stable cortical calcifications within the bilateral parietal white matter. Lateral ventricles and midline structures are stable. Mega cisterna magna again noted. No acute extra-axial fluid collections. No mass effect. Vascular: No hyperdense vessel or unexpected calcification. Skull: Normal. Negative for fracture or focal lesion. Sinuses/Orbits: No acute finding. Other: None. IMPRESSION: 1. Stable head CT, no acute intracranial process. Electronically Signed   By: Ozell Daring M.D.   On: 10/28/2023 18:27   DG Chest Portable 1 View Result Date: 10/28/2023 CLINICAL DATA:  Fall. EXAM: PORTABLE CHEST 1 VIEW, PORTABLE PELVIS COMPARISON:  11/26/2022, 03/12/2023. FINDINGS: Chest: The heart size and mediastinal contours are within normal limits. No consolidation, effusion, or pneumothorax is seen. No acute osseous abnormality. Pelvis: There is  no evidence of acute fracture or dislocation. Total hip arthroplasty changes are noted on the right without evidence of hardware loosening. Degenerative changes are present in the lumbar spine. A large amount of stool is present in the rectal vault. Vascular calcifications are noted in the lower extremities. IMPRESSION: 1. No active disease in the chest. 2. Status post right hip arthroplasty with no evidence of acute fracture or dislocation at the hips bilaterally. 3. Large amount of stool in the rectal vault, concerning for fecal impaction. Electronically Signed   By: Leita Birmingham M.D.   On: 10/28/2023 17:35   DG Pelvis Portable Result Date: 10/28/2023 CLINICAL DATA:  Fall. EXAM: PORTABLE CHEST 1 VIEW, PORTABLE PELVIS COMPARISON:  11/26/2022, 03/12/2023. FINDINGS: Chest: The heart size and mediastinal contours are within normal limits. No consolidation, effusion, or pneumothorax is seen. No acute osseous abnormality. Pelvis: There is no evidence of acute fracture or dislocation. Total hip arthroplasty changes are noted on the right without evidence of hardware loosening. Degenerative changes are present in the lumbar spine. A large amount of stool is present in the rectal vault. Vascular calcifications are noted in the lower extremities. IMPRESSION: 1. No active disease in the chest. 2. Status post right hip arthroplasty with no evidence of acute fracture or dislocation at the hips bilaterally. 3. Large amount of stool in the rectal vault, concerning for fecal impaction. Electronically Signed   By: Leita Birmingham M.D.   On: 10/28/2023 17:35     Procedures   Medications Ordered in the ED - No data to display                                  Medical Decision Making Amount and/or Complexity of Data Reviewed Labs: ordered. Radiology: ordered.   63 year old male PMH as above presenting to the ED after being reportedly found down earlier today with unclear downtime per collateral from SNF worker.  No  focal complaints.  No changes from neurologic baseline.  Examination, patient hemodynamically stable in no acute distress.  Resting comfortably with arms behind his head.  Normotensive.  Afebrile.  No tachycardia or tachypnea.  Saturating 99% on RA.  Patient oriented to person and shakes head yes/no to questions, appears to be unchanged from neurologic baseline.  Moving all extremities.  Secondary trauma assessment unremarkable as above.  Moving all extremities.  Given age and poor historian, CT head and cervical spine ordered -negative for evidence of acute intracranial abnormality or acute traumatic finding.  Chest  x-ray without evidence of acute cardiopulmonary abnormality, specifically no focal consolidation or evidence of pneumonia.  Plain films of pelvis without any evidence of acute fracture or dislocation in the hips bilaterally.  Urinalysis unremarkable without evidence of infection.  CMP without evidence of acute electrolytic disturbance or renal dysfunction.  No evidence of AKI or dehydration.  CBC with stable hemoglobin/cell counts and no evidence of leukocytosis.  CK WNL.  At this time, patient appears to be at neurologic baseline without any acute traumatic findings.  Plan for DC return to SNF.  Final diagnoses:  Fall, initial encounter    ED Discharge Orders     None          Mikaela Hilgeman, Elsie, MD 10/28/23 2021    Simon Lavonia SAILOR, MD 10/28/23 2324

## 2023-10-28 NOTE — ED Triage Notes (Signed)
 Pt bib ems from [facility] with ? Fall and possibly hit head. Pt bed is on the floor. Fall was not witnessed, unsure if pt actually fell. Over the last 2 days pt with increased agitation.  Hx dementia, CP, schizophrenia. At baseline pt confused and unable to relay pain.
